# Patient Record
Sex: Female | Born: 1940 | Race: White | Hispanic: No | Smoking: Former smoker
Health system: Southern US, Community
[De-identification: ages and names within clinical notes are randomized; demographics above are authoritative.]

## PROBLEM LIST (undated history)

## (undated) DIAGNOSIS — I10 Essential (primary) hypertension: Secondary | ICD-10-CM

## (undated) DIAGNOSIS — J449 Chronic obstructive pulmonary disease, unspecified: Secondary | ICD-10-CM

## (undated) DIAGNOSIS — E785 Hyperlipidemia, unspecified: Secondary | ICD-10-CM

---

## 1998-05-21 ENCOUNTER — Ambulatory Visit (HOSPITAL_COMMUNITY): Admission: RE | Admit: 1998-05-21 | Discharge: 1998-05-21 | Payer: Self-pay | Admitting: Ophthalmology

## 1998-09-12 ENCOUNTER — Emergency Department (HOSPITAL_COMMUNITY): Admission: EM | Admit: 1998-09-12 | Discharge: 1998-09-12 | Payer: Self-pay | Admitting: Emergency Medicine

## 1998-09-12 ENCOUNTER — Encounter: Payer: Self-pay | Admitting: Emergency Medicine

## 2005-04-20 ENCOUNTER — Emergency Department (HOSPITAL_COMMUNITY): Admission: EM | Admit: 2005-04-20 | Discharge: 2005-04-20 | Payer: Self-pay | Admitting: Family Medicine

## 2007-04-11 ENCOUNTER — Emergency Department (HOSPITAL_COMMUNITY): Admission: EM | Admit: 2007-04-11 | Discharge: 2007-04-11 | Payer: Self-pay | Admitting: Emergency Medicine

## 2007-05-07 ENCOUNTER — Ambulatory Visit: Payer: Self-pay | Admitting: Cardiology

## 2007-05-14 ENCOUNTER — Ambulatory Visit: Payer: Self-pay

## 2007-05-15 ENCOUNTER — Ambulatory Visit (HOSPITAL_COMMUNITY): Admission: RE | Admit: 2007-05-15 | Discharge: 2007-05-15 | Payer: Self-pay | Admitting: Family Medicine

## 2007-05-15 ENCOUNTER — Ambulatory Visit: Payer: Self-pay | Admitting: Surgery

## 2007-06-25 ENCOUNTER — Inpatient Hospital Stay (HOSPITAL_COMMUNITY): Admission: RE | Admit: 2007-06-25 | Discharge: 2007-06-30 | Payer: Self-pay | Admitting: Orthopedic Surgery

## 2009-11-02 ENCOUNTER — Encounter: Admission: RE | Admit: 2009-11-02 | Discharge: 2009-11-02 | Payer: Self-pay | Admitting: Orthopedic Surgery

## 2010-06-15 ENCOUNTER — Emergency Department (HOSPITAL_COMMUNITY)
Admission: EM | Admit: 2010-06-15 | Discharge: 2010-06-15 | Payer: Self-pay | Source: Home / Self Care | Admitting: Emergency Medicine

## 2010-12-07 NOTE — H&P (Signed)
Melanie Williamson, Melanie Williamson                   ACCOUNT NO.:  [id number]   MEDICAL RECORD NO.:  [id number]          PATIENT TYPE:  INP   LOCATION:  1611                         FACILITY:  [facility]   PHYSICIAN:  Ollen Gross, M.D.    DATE OF BIRTH:  1940-08-10   DATE OF ADMISSION:  06/25/2007  DATE OF DISCHARGE:                              HISTORY & PHYSICAL   DATE OF OFFICE VISIT HISTORY AND PHYSICAL:  June 14, 2007   CHIEF COMPLAINT:  Left knee pain.   HISTORY OF PRESENT ILLNESS:  The patient is a 70 year old female, well  known to Dr. Homero Fellers Aluisio.  She has been treated by Dr. Fannie Knee long-term  for arthritis in her knee.  DR. Aluisio has been treating her recently.  She has followed up.  She states she has had injections in the past  including cortisone, Synvisc or Hyalgan which did not give her much  benefit.  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  She has known end-stage arthritis and is  felt to be a good candidate.  She has been seen by Rema Fendt, nurse  practitioner, and felt to be stable from a medical standpoint.  She has  also been seen by Dr. Valera Castle preoperatively from a cardiac  standpoint and felt that she is cleared for surgery.   ALLERGIES:  BIAXIN causes a rash.   CURRENT MEDICATIONS:  Xanax, lisinopril, verapamil, aspirin, metoprolol,  hydrochlorothiazide, multivitamin.   PAST MEDICAL HISTORY:  1. Hypertension.  2. Dyslipidemia.  3. Anxiety.  4. Atherosclerotic vascular disease.  5. History of TIA.   PAST SURGICAL HISTORY:  1. Tonsils and adenoids in 1958.  2. Hysterectomy in 1968.  3. Appendectomy in 1970.  4. Left knee surgery in 1976.   SOCIAL HISTORY:  Single.  She works as a Production designer, theatre/television/film in Warden/ranger at the  [employer], quit smoking in January 2008, occasional intake  of alcohol.  Three children.  Family members to assist  her with care  after surgery.   FAMILY HISTORY:  Hypertension and diabetes.   REVIEW OF SYSTEMS:  GENERAL:  No fevers, chills or night sweats.  NEUROLOGIC:  No seizures, syncope or paralysis.  RESPIRATORY:  No  shortness of breath, productive cough or hemoptysis.  CARDIOVASCULAR:  No chest pain, angina or orthopnea.  GI:  No nausea, vomiting, diarrhea  or constipation.  GU:  No dysuria, hematuria or discharge.  MUSCULOSKELETAL:  Left knee.   PHYSICAL EXAMINATION:  VITAL SIGNS:  Pulse 64, respiration 16, blood  pressure 122/64.  GENERAL:  A 70 year old white female, well-nourished, well-developed, of  short stature, in no acute distress.  She is alert, oriented and  cooperative, very pleasant, an excellent historian.  HEENT:  Normocephalic, atraumatic.  Pupils are round and reactive.  Oropharynx is clear.  EOMs intact.  She does have full upper and full  lower dentures noted.  NECK:  Supple.  CHEST: Clear, anterior  and posterior chest walls.  No rhonchi, rales or  wheezing.  HEART:  Regular rate and rhythm.  No murmur.  S1-S2 noted.  ABDOMEN:  Soft and nontender.  Bowel sounds are present.  RECTAL, BREASTS AND GENITALIA:  Not done and not pertinent to present  illness.  EXTREMITIES:  Left knee:  Small amount of valgus malalignment deformity.  Range of motion 5-115.  Marked crepitus is noted.  No instability.   IMPRESSION:  1. Osteoarthritis, left knee.  2. Hypertension.  3. Dyslipidemia.  4. Anxiety.  5. Atherosclerotic vascular disease.  6. History of transient ischemic attack.   PLAN:  The patient will be admitted to [facility] to undergo a  left total knee replacement arthroplasty.  Surgery will be performed by  Dr. Ollen Gross.      Alexzandrew L. Perkins, P.A.C.      Ollen Gross, M.D.  Electronically Signed    ALP/MEDQ  D:  06/27/2007  T:  06/28/2007  Job:  [id number]   cc:   [facility] Rema Fendt, NP

## 2010-12-07 NOTE — Op Note (Signed)
Melanie Williamson, Melanie Williamson                   ACCOUNT NO.:  [id number]   MEDICAL RECORD NO.:  [id number]          PATIENT TYPE:  INP   LOCATION:  0010                         FACILITY:  [facility]   PHYSICIAN:  Ollen Gross, M.D.    DATE OF BIRTH:  [birth year]   DATE OF PROCEDURE:  06/25/2007  DATE OF DISCHARGE:                               OPERATIVE REPORT   PREOPERATIVE DIAGNOSIS:  Osteoarthritis left knee.   POSTOPERATIVE DIAGNOSIS:  Osteoarthritis left knee.   PROCEDURE:  Left total knee arthroplasty.   SURGEON:  Ollen Gross, M.D.   ASSISTANT:  Avel Peace PA-C.   ANESTHESIA:  Spinal.   ESTIMATED BLOOD LOSS:  Minimal.   DRAINS:  None.   TOURNIQUET TIME:  36 minutes at 300 mmHg.   COMPLICATIONS:  None.   CONDITION:  Stable to recovery.   CLINICAL NOTE:  Melanie Williamson is a 70 year old female with severe end-stage  arthritis of the left knee with progressively worsening pain and  dysfunction.  She has failed nonoperative management and presents now  for total knee arthroplasty.   PROCEDURE IN DETAIL:  After the successful administration of spinal  anesthetic, a tourniquet is placed high on her left thigh and left lower  extremity prepped and draped in the usual sterile fashion.  Extremity is  wrapped in Esmarch, knee flexed and tourniquet inflated to 300 mmHg.  Midline incision was made with a 10 blade through subcutaneous tissue to  the level of the extensor mechanism.  A fresh blade is used to make a  medial parapatellar arthrotomy.  Soft tissue over the proximal medial  tibia subperiosteally elevated to the joint line with the knife and into  the semimembranosus bursa with a Cobb elevator.  Soft tissue laterally  is elevated with attention being paid to avoiding the patellar tendon on  tibial tubercle.  Patella is subluxed laterally, knee flexed 90 degrees  and ACL and PCL removed.  Drill was used to create a starting hole in  the distal femur and the canal is  thoroughly irrigated.   The 5 degree  left valgus alignment guide is placed and referencing off the posterior  condyles, rotation is marked and a block pinned to remove 10 mm off the  distal femur.  Distal femoral resection is made with an oscillating saw.  Sizing block is placed and size 2.5 is most appropriate.  Rotation is  marked at the epicondylar axis.  Size 2.5 cutting block is  placed and  the anterior, posterior and chamfer cuts are made.   Tibia is subluxed forward and the menisci are removed.  The  extramedullary tibial alignment guide is placed referencing proximally  at the medial aspect of the tibial tubercle and distally along the  second metatarsal axis and tibial crest.  The block is pinned to remove  2 mm off the more deficient lateral side.  Tibial resection is made with  an oscillating saw.  Size 2.5 is most appropriate tibial component and  the proximal tibia is prepared with the modular drill and keel punch for  size 2.5.  The  femoral preparation is completed the intercondylar cut.   Size 2.5 mobile bearing tibial trial, 2.5 posterior stabilized femoral  trial and a 12.5 mm posterior stabilized rotating platform insert trial  were placed.  With the 12.5, full extension is achieved with excellent  varus and valgus balance throughout full range of motion.  Patella is  everted, thickness measured to be 22 mm.  Freehand resection is taken to  13 mm, 35 template is placed, lug holes are drilled, trial patella s  placed and it tracks normally.  Osteophytes are then removed off the  posterior femur with the trial in place.  All trials are removed and the  cut bone surfaces are prepared with pulsatile lavage.  Cement is mixed  and once ready for implantation, the size 2.5 mobile bearing tibia, 2.5  posterior stabilized femur, 35 patella are cemented into place.  Patella  is held with a clamp.  Trial 12.5 mm insert is placed, knee held in full  extension, all extruded cement removed.  When the  cement is fully  hardened and the trials removed, the FloSeal injected on the posterior  capsule.  The permanent 12.5 mm posterior stabilized rotating platform  insert is placed in the tibial tray.  The FloSeal is then injected into  the medial and lateral gutters in suprapatellar area.  The tourniquet is  released then for total time of 36 minutes.  Moist sponge is placed and  held for two minutes.  Sponge removed and minimal bleeding is  encountered.  That which is encountered  is stopped with electrocautery.  We irrigated again and then closed the arthrotomy with interrupted #1  PDS.  Flexion against gravity is 140 degrees.  The subcu is closed with  interrupted 2-0 Vicryl, subcuticular running 4-0 Monocryl.  The incision  is cleaned and dried and Steri-Strips and bulky sterile dressing  applied.  She is placed into a knee immobilizer, awakened and  transported to recovery in stable condition.      Ollen Gross, M.D.  Electronically Signed     FA/MEDQ  D:  06/25/2007  T:  06/25/2007  Job:  [id number]

## 2010-12-07 NOTE — Assessment & Plan Note (Signed)
[facility]                            CARDIOLOGY OFFICE NOTE   NAME:Schiavo, Melanie Williamson                          MRN:          [id number]  DATE:05/07/2007                            DOB:          1941-03-03    I was asked by Dr. Ollen Gross to clear Andy Gauss for left knee total  knee replacement.   HISTORY OF PRESENT ILLNESS:  She is a delightful 70 year old divorced  white female who I actually know personally through [employer].  She is having a lot of pain in her left knee and has been advised  by Dr. Lequita Halt to have total knee replacement which is scheduled to  occur the first week of December.   She has no previous cardiac history.  She had an EKG done preoperatively  which showed some changes from her previous ECG.   She is having no symptoms of shortness of breath, tachy palpitations,  presyncope, syncope, chest pain.  She does have a history of  hypertension for a number of years which has been under good control she  says.  She is followed at [facility].   Her preoperative chest x-ray shows some atherosclerotic calcification of  the thoracic aorta and great vessels and some tortuosity but normal  heart size.   PAST MEDICAL HISTORY:  1. ALLERGIC TO AN ANTIBIOTIC BUT SHE CAN NOT REMEMBER THE NAME OF IT,      IT CAUSED A RASH.  2. She quit smoking 3 months ago but was not a heavy smoker.  3. She drinks alcohol socially.  4. She does not have elevated cholesterol.   PREVIOUS SURGERIES:  1. Arthroscopic knee surgery.  2. Appendectomy in the past.   FAMILY HISTORY:  She had a brother who had premature coronary disease  but he was diabetic.   Her occupation:  She is a Merchandiser, retail at the [employer], she  has been there for a number of years.   REVIEW OF SYSTEMS:  Other than the HPI is negative.   EXAMINATION:  Her blood pressure is 166/86 today, her pulse is 83 and  regular, she is 5 feet tall  and weighs 145 pounds.  HEENT:  Normocephalic/atraumatic, PERRLA, extraocular movements intact,  sclerae are clear, facial symmetry is normal.  NECK:  Supple, carotid upstrokes were equal bilaterally without bruits,  there is no JVD, thyroid is not enlarged, trachea is midline.  LUNGS:  Reveal decreased breath sounds throughout, no rhonchi or  wheezes.  HEART:  Reveals a nondisplaced PMI.  She has normal S1 and S2 without  murmur, rub or gallop.  ABDOMINAL:  Soft with good bowel sounds, there is no midline bruit,  there is no hepatomegaly.  EXTREMITIES:  Reveal no cyanosis, clubbing or edema.  She has a mottled  skin pattern.  Pulses were brisk both dorsalis pedis and posterior  tibial.  There is no sign of DVT.  NEURO:  Intact.   Her EKG today shows poor R wave progression across the anterior  precordium and some ST segment flattening in leads  2 and 3 and biphasic  T waves in 1 and aVL.   I had a nice chat with Ayrianna.  We will plan a 2D echocardiogram to assure  ourselves that she has not had a previous anterior septal infarct.  I  suspect this is probably related to clockwise rotation of her heart from  COPD.   Some of the EKG changes could be due to hypertension.  I have advised  her to follow up with Family Practice at [facility] to assure good  blood pressure control.  She may need some adjustment in her medication  which has not been changed in a while.   I also reinforced the fact that she should not smoke.  I will see her  back p.r.n. otherwise and will clear her for surgery if her echo is  normal.     Maisie Fus C. Daleen Squibb, MD, [facility]  Electronically Signed    TCW/MedQ  DD: 05/07/2007  DT: 05/08/2007  Job #: [id number]   cc:   Ollen Gross, M.D.  [facility]

## 2010-12-10 NOTE — Discharge Summary (Signed)
Melanie Williamson, Melanie Williamson                   ACCOUNT NO.:  [id number]   MEDICAL RECORD NO.:  [id number]          PATIENT TYPE:  INP   LOCATION:  1611                         FACILITY:  [facility]   PHYSICIAN:  Melanie Williamson, M.D.    DATE OF BIRTH:  22-Jan-1941   DATE OF ADMISSION:  06/25/2007  DATE OF DISCHARGE:  06/30/2007                               DISCHARGE SUMMARY   ADMITTING DIAGNOSIS:  1. Osteoarthritis left knee.  2. Hypertension.  3. Dyslipidemia.  4. Anxiety.  5. Atherosclerotic vascular disease.  6. History of transient ischemic attack.   DISCHARGE DIAGNOSIS:  1. Osteoarthritis left knee status post left total knee arthroplasty.  2. Acute blood loss anemia.  3. Status post transfusion without sequelae.  4. Postop hyponatremia improved.  5. Hypertension.  6. Dyslipidemia.  7. Anxiety.  8. Atherosclerotic vascular disease.  9. History of transient ischemic attack.   PROCEDURE:  June 25, 2007 left total knee, surgeon Dr. Lequita Halt,  assistant Melanie Peace, PA-C, anesthesia was spinal.   CONSULTS:  None.   BRIEF HISTORY:  Melanie Williamson is a 70 year old female with severe end-stage  arthritis of the left knee with progressive worsening pain and  dysfunction who failed nonoperative management and now presents for a  total knee   LABORATORY DATA:  Preop CBC showed a hemoglobin of 12.9, hematocrit  37.1, dropped postop down to 10.5 and then 8.7, drifted further down to  7.5, given 2 units of blood. Post transfusion hemoglobin 11.1 and 32.1.  Chem panel on admission all within normal limits with the exception of  minimally elevated AST of 40 and minimally elevated ALT of 58. Serial  BMETs are followed, sodium did drop down from 135 to 131 back up to 136.  The remaining electrolytes remained within normal limits. Preop UA  negative.  Blood group type A+. Chest x-ray two view June 28, 2007  probable COPD, no active disease. There is an EKG on the chart dated  May 07, 2007 normal  sinus rhythm, abnormal EKG, anterior septal  infarct, age undetermined, confirmed by Dr. Daleen Williamson.   HOSPITAL COURSE:  The patient was admitted to [facility],  tolerated the procedure well and later transferred to the recovery room  on the orthopedic floor, started on PCA and p.o. analgesics. Given 24  hours postop IV antibiotics, doing pretty well on the morning of day  one. IV came out so it was restarted. Removed her knee immobilizer. Had  a little bit of weakness with the dorsiflexion of the  left foot so I  held the CPM, discontinued the knee immobilizer only to use for  ambulation. I started getting up out of bed. By day two, she was doing a  little bit better, the weakness in her foot had improved, it just needed  some swelling compression. The dressing was changed, incision looked  good.  Hemoglobin was drifting down noted to be 8.9 on day two. Started  iron, a little tired but did get up by day two and walk about 90 feet.  Day three still a little bit better.  Hemoglobin was stable, was running  a little bit of O2 saturations being low and some mild confusion felt to  be narcotic, but we checked the chest x-ray and no active disease was  seen on that and monitored. Her O2 sats were slowly improving. Felt to  be possible component of blood,  being anemic. She was a little low the  next day at 7.5, definitely recommended blood even though her confusion  had resolved. Felt morning the confusion was more narcotic and possibly  O2 sats.  She did receive 2 units of blood and her blood counts came  back up.  She did well after the blood feeling better and was ready to  go home over the weekend on June 30, 2007.   DISCHARGE/PLAN:  1. The patient was discharged home on June 30, 2007.  2. Discharge diagnoses please see above.  3. Discharge medications:  Darvocet, Robaxin, Nu-Iron and Coumadin.   ACTIVITY:  Weightbearing as tolerated, total knee protocol, home health  PT,  home health nursing.   DISPOSITION:  Home.   DIET:  Low-sodium heart-healthy diet.   FOLLOW-UP:  On December 16 or December 18, call the office for an  appointment.   CONDITION ON DISCHARGE:  Improved.      Melanie Williamson, P.A.C.      Melanie Williamson, M.D.  Electronically Signed    ALP/MEDQ  D:  08/28/2007  T:  08/28/2007  Job:  [id number]   cc:   Melanie Moh, NP  [facility]

## 2011-02-21 ENCOUNTER — Emergency Department (HOSPITAL_COMMUNITY)
Admission: EM | Admit: 2011-02-21 | Discharge: 2011-02-22 | Disposition: A | Discharge status: Home / Self Care | Payer: Medicare Other | Source: Home / Self Care | Attending: Emergency Medicine | Admitting: Emergency Medicine

## 2011-02-21 DIAGNOSIS — L989 Disorder of the skin and subcutaneous tissue, unspecified: Secondary | ICD-10-CM | POA: Insufficient documentation

## 2011-02-21 DIAGNOSIS — IMO0002 Reserved for concepts with insufficient information to code with codable children: Secondary | ICD-10-CM | POA: Insufficient documentation

## 2011-02-22 ENCOUNTER — Inpatient Hospital Stay (HOSPITAL_COMMUNITY)
Admission: EM | Admit: 2011-02-22 | Discharge: 2011-02-24 | DRG: 603 | Disposition: A | Discharge status: Home / Self Care | Payer: Medicare Other | Attending: Internal Medicine | Admitting: Internal Medicine

## 2011-02-22 DIAGNOSIS — E039 Hypothyroidism, unspecified: Secondary | ICD-10-CM | POA: Diagnosis present

## 2011-02-22 DIAGNOSIS — I10 Essential (primary) hypertension: Secondary | ICD-10-CM | POA: Diagnosis present

## 2011-02-22 DIAGNOSIS — E871 Hypo-osmolality and hyponatremia: Secondary | ICD-10-CM | POA: Diagnosis present

## 2011-02-22 DIAGNOSIS — F17200 Nicotine dependence, unspecified, uncomplicated: Secondary | ICD-10-CM | POA: Diagnosis present

## 2011-02-22 DIAGNOSIS — IMO0002 Reserved for concepts with insufficient information to code with codable children: Principal | ICD-10-CM | POA: Diagnosis present

## 2011-02-22 LAB — CBC
MCH: 31.4 pg (ref 26.0–34.0)
MCHC: 34.1 g/dL (ref 30.0–36.0)
MCV: 92.1 fL (ref 78.0–100.0)
Platelets: 215 10*3/uL (ref 150–400)
RDW: 12.2 % (ref 11.5–15.5)

## 2011-02-22 LAB — DIFFERENTIAL
Basophils Relative: 0 % (ref 0–1)
Eosinophils Absolute: 0.2 10*3/uL (ref 0.0–0.7)
Eosinophils Relative: 2 % (ref 0–5)
Lymphs Abs: 1.2 10*3/uL (ref 0.7–4.0)
Monocytes Relative: 8 % (ref 3–12)

## 2011-02-22 LAB — BASIC METABOLIC PANEL
Calcium: 9.9 mg/dL (ref 8.4–10.5)
Creatinine, Ser: 0.94 mg/dL (ref 0.50–1.10)
GFR calc Af Amer: >60 mL/min (ref >60)
GFR calc non Af Amer: 59 mL/min — ABNORMAL LOW (ref >60)

## 2011-02-23 LAB — DIFFERENTIAL
Basophils Absolute: 0 10*3/uL (ref 0.0–0.1)
Basophils Relative: 1 % (ref 0–1)
Eosinophils Absolute: 0.2 10*3/uL (ref 0.0–0.7)
Eosinophils Relative: 3 % (ref 0–5)
Lymphocytes Relative: 23 % (ref 12–46)
Monocytes Absolute: 0.6 10*3/uL (ref 0.1–1.0)

## 2011-02-23 LAB — CBC
HCT: 33.8 % — ABNORMAL LOW (ref 36.0–46.0)
MCHC: 34.3 g/dL (ref 30.0–36.0)
RDW: 12.3 % (ref 11.5–15.5)

## 2011-02-23 LAB — BASIC METABOLIC PANEL
BUN: 17 mg/dL (ref 6–23)
Creatinine, Ser: 1.09 mg/dL (ref 0.50–1.10)
GFR calc Af Amer: >60 mL/min (ref >60)
GFR calc non Af Amer: 50 mL/min — ABNORMAL LOW (ref >60)
Potassium: 4.1 mEq/L (ref 3.5–5.1)

## 2011-02-23 LAB — HEMOGLOBIN A1C: Hgb A1c MFr Bld: 5.7 % — ABNORMAL HIGH (ref <5.7)

## 2011-02-26 NOTE — Discharge Summary (Signed)
Melanie Williamson, Melanie Williamson                   ACCOUNT NO.:  [id number]  MEDICAL RECORD NO.:  [id number]  LOCATION:  1344                         FACILITY:  [facility]  PHYSICIAN:  Marinda Elk, M.D.DATE OF BIRTH:  [birth year]  DATE OF ADMISSION:  02/22/2011 DATE OF DISCHARGE:  02/24/2011                              DISCHARGE SUMMARY   PRIMARY CARE DOCTOR:  [facility].  DISCHARGE DIAGNOSES: 1. Failed outpatient treatment of cellulitis, now improved. 2. Hyponatremia. 3. Hypertension.  DISCHARGE MEDICATIONS: 1. Doxycycline 100 mg p.o. b.i.d. 2. __________ apply topically t.i.d. 3. Lisinopril 40 mg daily. 4. Metoprolol 25 mg b.i.d. 5. Nicotine patch transdermally daily. 6. Pravachol 40 mg every morning. 7. Verapamil SR 240 mg every morning. 8. Vitamin D over-the-counter daily. 9. Vitamin E over the counter daily.  IMAGING:  None.  BRIEF ADMITTING HISTORY AND PHYSICAL:  This is a 69 year old female with past medical history of cellulitis, recently treated by her primary care doctor with Bactrim topical n.p.o.  She sustained laceration 5 days prior to admission and has got infected and as mentioned previously, she saw primary care doctor, who has been treating her for cellulitis.  Her cellulitis continued to get worse and advancing in the inside of her arm.  So, she decided to come to the ED.  She was admitted by [facility] and for further evaluation and treatment, please refer to dictation from February 22, 2011, for further details.  LABORATORY DATA ON ADMISSION:  A white count of 10.2, hemoglobin of 12, platelet count 215, ANC of 7.9.  Sodium was 128, potassium 4.8, chloride 98, bicarb of 25, glucose 104, BUN of 16, creatinine 0.9, calcium 9.9, her hemoglobin A1c was 5.7.  ASSESSMENT PLAN: 1. Failed outpatient treatment cellulitis.  She was admitted to the     floor, was on IV vancomycin and Zosyn.  By the next day, it was     improved.  Her vancomycin and Zosyn were  stopped.  She was started     on doxy.  By the next day, on the day of discharge, her redness has     significantly decreased and she started to drain.  There is no     collection or abscess.  She will be started on her doxy  for     several more days. 2. Hyponatremia, that is probably secondary to dehydration.  This     resolved with IV fluids. 3. Hypertension, currently high 160/76.  She was restarted on home     meds.  This will be followed up by her primary care doctor and     titrate medications as needed.  VITALS ON DAY OF DISCHARGE:  VITAL SIGNS:  Temperature of 98, pulse of 80, respiration of 16, blood pressure 162/76.  She was satting 94% on room air.  LABORATORY DATA:  On day of discharge shows a sodium of 132, potassium 4.1, chloride 98, bicarb 24, glucose of 106, BUN of 17, creatinine 1.0, calcium 9.5.  Her white count of 6.5, hemoglobin of 11.6, platelet count of 190, and ANC of 4.2.     Marinda Elk, M.D.     AF/MEDQ  D:  02/24/2011  T:  02/25/2011  Job:  [id number]  cc:   [facility]  Electronically Signed by Marinda Elk M.D. on 02/26/2011 08:07:06 PM

## 2011-03-08 NOTE — H&P (Signed)
NAMEDESSIRAE, SCAROLA                   ACCOUNT NO.:  [id number]  MEDICAL RECORD NO.:  [id number]  LOCATION:  [facility]                         FACILITY:  [facility]  PHYSICIAN:  Altha Harm, MDDATE OF BIRTH:  May 29, 1941  DATE OF ADMISSION:  02/22/2011 DATE OF DISCHARGE:                             HISTORY & PHYSICAL   CHIEF COMPLAINT:  Failed outpatient treatment of cellulitis of the right upper extremity.  HISTORY OF PRESENT ILLNESS:  Ms. Scogin is a very lovely 70 year old female who states that approximately 5 days ago she was in the grocery store picking up a bag of potatoes from out of the cart when she sustained a laceration to her right forearm.  The patient states that she was seen in the Urgent Care 2 days' later and received Septra which she has been taking.  She states that since taking the Septra, the cellulitis has actually advanced up her arm and outside of the lines that there were originally marked.  She was seen in the emergency room last night and sent home, however, even today the cellulitis has extended even further.  The patient denies any fever, any chills.  She denies any nausea, vomiting or diarrhea, and states that actually see she feels well.  She states her only concern is the arm.  The patient states that she went to the pharmacy and got some what she described as Tegaderm and placed it on the forearm and she thinks that it was at that time that she started seeing an advancement of the cellulitis.  PAST MEDICAL HISTORY:  Significant for hypertension and hypothyroidism.  FAMILY HISTORY:  Coronary artery disease in father and brother. Diabetes in her sister and her brother, and lung cancer in her brother.  SOCIAL HISTORY:  The patient lives with her granddaughter Gershon Cull and her grandson.  Her son, Verdia Kuba, is her healthcare power of attorney.  She smokes approximately 1 cigarette per day.  She occasionally drinks a beer and she denies any drug use.   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  ALLERGIES:  CLARITHROMYCIN.  CURRENT MEDICATIONS: 1. Lisinopril 40 mg p.o. daily. 2. Bactroban applied topically t.i.d. 3. Nicotine patch 40 mcg per day transdermally daily. 4. Metoprolol 25 mg p.o. b.i.d. 5. Verapamil SR 240 mg p.o. daily. 6. Pravachol 40 mg p.o. daily. 7. Septra DS 1 tablet p.o. b.i.d. 8. Vitamin D3 over-the-counter 1 tablet p.o. daily. 9. Vitamin E over-the-counter 1 tablet p.o. daily.  PRIMARY CARE PHYSICIANS:  [facility].  REVIEW OF SYSTEMS:  All other systems negative.  STUDIES IN THE EMERGENCY ROOM:  Hemogram shows a white blood cell count of 10.2, hematocrit of 37.5, hemoglobin of 12.8, platelet count of 315. Sodium 128, potassium 4.8, chloride 94, bicarb 25, BUN 16, creatinine 0.94.  PHYSICAL EXAMINATION:  GENERAL:  The patient is a very lively 70 year old lady who looks younger than her stated age. VITAL SIGNS:  Temperature is 97.3, heart rate 81, respiratory rate 20, blood pressure 164/85, O2 saturations are 90% on room air.  HEENT: Normocephalic.  The patient has a scratch on her nasal flare;  she states what she sustained was a scratch by her grandson.  Pupils are equally round and reactive to light and accommodation.  Extraocular movements are intact.  Conjunctiva show no injection or icterus.  No conjunctival pallor.  Fundi are benign.  Oropharynx is moist.  No exudate, erythema or lesions are noted. NECK:  Trachea is midline.  No masses, no thyromegaly, no JVD, no carotid bruit. RESPIRATORY:  Normal respiratory effort, equal excursions bilaterally. No wheezing or rhonchi noted.  No increased vocal fremitus noted. CARDIOVASCULAR:  Normal S1 and S2.  No murmurs, rubs or gallops noted. PMI is nondisplaced.  No heaves or thrills on palpation. ABDOMEN:  Obese, soft, nontender, nondistended.  No masses, no hepatosplenomegaly noted. LYMPH NODE  SURVEY:  Some axillary lymphadenopathy noted in the right axilla.  No cervical, left axillary or inguinal lymphadenopathy noted. NEUROLOGIC:  No focal neurological deficits.  Cranial nerves II-XII are grossly intact.  DTRs are 2+ bilaterally in the upper and lower extremities. PSYCHIATRIC:  Alert and oriented x3, good insight and cognition, good recent and remote recall. EXTREMITIES and SKIN:  The patient has an area of the right forearm which is erythematous.  There is warmth and edema.  There is no induration noted.  There is a laceration which is partially healed on the inner surface of the forearm about midway up the forearm.  There is no induration and no fluid expressed from the area.  ASSESSMENT AND PLAN: 1. This is a patient who presents with cellulitis with failed     outpatient treatment.  The patient is not a diabetic but does have     diabetes that runs in her family and this could be a herald sign of     presentation.  Thus we will go ahead and check a hemoglobin A1c on     the patient and treat her as a diabetic given the failure of an     appropriate antibiotic to treat this.  We will treat with     vancomycin and Zosyn and reassess on tomorrow to see whether or not     there is any improvement in the cellulitis. 2. The patient has chronic hyponatremia and this is represented with a     sodium of 128 which the patient says is usual for her.  I will go     ahead and recheck it tomorrow to ensure that she does not have any     further decrease in her sodium. 3. Hypertension:  The patient's blood pressure is not optimally     controlled but is acceptable.  The patient will resume her usual     medications.  In terms of her diet, due to the patient's     hyponatremia, I will not put her on salt-restricted diet but rather     on a regular diet. 4. Deep venous thrombosis prophylaxis with Lovenox. 5. We will reassess the patient.  Further therapeutic approach will     depend  upon the patient's initial response to therapy and testing.     Altha Harm, MD     MAM/MEDQ  D:  02/22/2011  T:  02/22/2011  Job:  [id number]  cc:   [facility] [address] [place], [place]  Electronically Signed by Marthann Schiller MD on 03/08/2011 08:08:51 PM

## 2011-05-02 LAB — BASIC METABOLIC PANEL
BUN: 17
Calcium: 8.2 — ABNORMAL LOW
Chloride: 98
Creatinine, Ser: 1.04
Creatinine, Ser: 1.71 — ABNORMAL HIGH
GFR calc Af Amer: 49 — ABNORMAL LOW
GFR calc Af Amer: >60
GFR calc Af Amer: >60
GFR calc non Af Amer: 30 — ABNORMAL LOW
GFR calc non Af Amer: 41 — ABNORMAL LOW
GFR calc non Af Amer: 53 — ABNORMAL LOW
GFR calc non Af Amer: >60
Glucose, Bld: 99
Potassium: 3.8
Potassium: 4
Potassium: 5.1
Sodium: 133 — ABNORMAL LOW
Sodium: 136

## 2011-05-02 LAB — PROTIME-INR
INR: 1
INR: 1.4
INR: 2.1 — ABNORMAL HIGH
INR: 3.3 — ABNORMAL HIGH
Prothrombin Time: 13.2
Prothrombin Time: 23.8 — ABNORMAL HIGH
Prothrombin Time: 25.7 — ABNORMAL HIGH

## 2011-05-02 LAB — CBC
HCT: 21.1 — ABNORMAL LOW
HCT: 25.1 — ABNORMAL LOW
HCT: 32.1 — ABNORMAL LOW
Hemoglobin: 7.5 — CL
Hemoglobin: 8.9 — ABNORMAL LOW
MCHC: 34.6
MCV: 94.5
MCV: 96.4
Platelets: 270
Platelets: 286
RBC: 2.2 — ABNORMAL LOW
RBC: 2.6 — ABNORMAL LOW
RBC: 2.63 — ABNORMAL LOW
RDW: 12.1
WBC: 11.2 — ABNORMAL HIGH
WBC: 8.4

## 2011-05-02 LAB — CROSSMATCH
ABO/RH(D): A POS
Antibody Screen: NEGATIVE

## 2011-05-02 LAB — DIFFERENTIAL
Basophils Relative: 0
Eosinophils Absolute: 0.1 — ABNORMAL LOW
Eosinophils Relative: 1
Neutrophils Relative %: 75

## 2011-05-03 LAB — CBC
HCT: 37.1
Hemoglobin: 12.9
MCHC: 34.9
Platelets: 312
RDW: 11.9

## 2011-05-03 LAB — PROTIME-INR
INR: 0.9
Prothrombin Time: 12.4

## 2011-05-03 LAB — ABO/RH: ABO/RH(D): A POS

## 2011-05-03 LAB — URINALYSIS, ROUTINE W REFLEX MICROSCOPIC
Ketones, ur: NEGATIVE
Protein, ur: NEGATIVE
Urobilinogen, UA: 0.2

## 2011-05-03 LAB — COMPREHENSIVE METABOLIC PANEL
Albumin: 3.8
Alkaline Phosphatase: 108
BUN: 10
Calcium: 9.6
Glucose, Bld: 112 — ABNORMAL HIGH
Potassium: 4.2
Sodium: 135
Total Protein: 7

## 2011-05-03 LAB — TYPE AND SCREEN: ABO/RH(D): A POS

## 2011-07-17 ENCOUNTER — Other Ambulatory Visit: Payer: Self-pay

## 2011-07-17 ENCOUNTER — Inpatient Hospital Stay (HOSPITAL_COMMUNITY)
Admission: EM | Admit: 2011-07-17 | Discharge: 2011-07-19 | DRG: 191 | Disposition: A | Discharge status: Home / Self Care | Payer: Medicare Other | Attending: Internal Medicine | Admitting: Internal Medicine

## 2011-07-17 ENCOUNTER — Emergency Department (HOSPITAL_COMMUNITY): Payer: Medicare Other

## 2011-07-17 DIAGNOSIS — J40 Bronchitis, not specified as acute or chronic: Secondary | ICD-10-CM | POA: Diagnosis present

## 2011-07-17 DIAGNOSIS — F17200 Nicotine dependence, unspecified, uncomplicated: Secondary | ICD-10-CM | POA: Diagnosis present

## 2011-07-17 DIAGNOSIS — I10 Essential (primary) hypertension: Secondary | ICD-10-CM

## 2011-07-17 DIAGNOSIS — E871 Hypo-osmolality and hyponatremia: Secondary | ICD-10-CM

## 2011-07-17 DIAGNOSIS — R0902 Hypoxemia: Secondary | ICD-10-CM | POA: Diagnosis present

## 2011-07-17 DIAGNOSIS — E860 Dehydration: Secondary | ICD-10-CM

## 2011-07-17 DIAGNOSIS — E785 Hyperlipidemia, unspecified: Secondary | ICD-10-CM

## 2011-07-17 DIAGNOSIS — J479 Bronchiectasis, uncomplicated: Secondary | ICD-10-CM | POA: Diagnosis present

## 2011-07-17 DIAGNOSIS — M4000 Postural kyphosis, site unspecified: Secondary | ICD-10-CM | POA: Diagnosis present

## 2011-07-17 DIAGNOSIS — M8448XA Pathological fracture, other site, initial encounter for fracture: Secondary | ICD-10-CM | POA: Diagnosis present

## 2011-07-17 DIAGNOSIS — J441 Chronic obstructive pulmonary disease with (acute) exacerbation: Principal | ICD-10-CM | POA: Diagnosis present

## 2011-07-17 HISTORY — DX: Hyperlipidemia, unspecified: E78.5

## 2011-07-17 HISTORY — DX: Essential (primary) hypertension: I10

## 2011-07-17 LAB — CBC
HCT: 37.5 % (ref 36.0–46.0)
Hemoglobin: 12.9 g/dL (ref 12.0–15.0)
MCHC: 34.4 g/dL (ref 30.0–36.0)
MCV: 92.1 fL (ref 78.0–100.0)
RDW: 11.9 % (ref 11.5–15.5)
WBC: 5.8 10*3/uL (ref 4.0–10.5)

## 2011-07-17 LAB — BASIC METABOLIC PANEL
BUN: 31 mg/dL — ABNORMAL HIGH (ref 6–23)
CO2: 22 mEq/L (ref 19–32)
Calcium: 9 mg/dL (ref 8.4–10.5)
Chloride: 91 mEq/L — ABNORMAL LOW (ref 96–112)
Creatinine, Ser: 1.28 mg/dL — ABNORMAL HIGH (ref 0.50–1.10)

## 2011-07-17 LAB — DIFFERENTIAL
Basophils Absolute: 0 10*3/uL (ref 0.0–0.1)
Eosinophils Relative: 0 % (ref 0–5)
Lymphocytes Relative: 21 % (ref 12–46)
Monocytes Absolute: 0.7 10*3/uL (ref 0.1–1.0)
Monocytes Relative: 12 % (ref 3–12)
Neutro Abs: 3.8 10*3/uL (ref 1.7–7.7)

## 2011-07-17 LAB — OSMOLALITY: Osmolality: 269 mOsm/kg — ABNORMAL LOW (ref 275–300)

## 2011-07-17 MED ORDER — ENOXAPARIN SODIUM 40 MG/0.4ML ~~LOC~~ SOLN
40.0000 mg | SUBCUTANEOUS | Status: DC
  Administered 2011-07-17 – 2011-07-18 (×2): 40 mg via SUBCUTANEOUS
  Filled 2011-07-17 (×4): qty 0.4

## 2011-07-17 MED ORDER — METOPROLOL SUCCINATE ER 25 MG PO TB24
25.0000 mg | ORAL_TABLET | Freq: Two times a day (BID) | ORAL | Status: DC
Start: 2011-07-17 — End: 2011-07-19
  Administered 2011-07-17 – 2011-07-19 (×4): 25 mg via ORAL
  Filled 2011-07-17 (×7): qty 1

## 2011-07-17 MED ORDER — DEXTROSE 5 % IV SOLN
1.0000 g | Freq: Once | INTRAVENOUS | Status: AC
  Administered 2011-07-17: 1 g via INTRAVENOUS
  Filled 2011-07-17: qty 10

## 2011-07-17 MED ORDER — VERAPAMIL HCL 240 MG (CO) PO TB24
240.0000 mg | ORAL_TABLET | Freq: Every day | ORAL | Status: DC
  Administered 2011-07-17 – 2011-07-18 (×2): 240 mg via ORAL
  Filled 2011-07-17 (×4): qty 1

## 2011-07-17 MED ORDER — ONDANSETRON HCL 4 MG/2ML IJ SOLN: 4.0000 mg | Freq: Four times a day (QID) | INTRAMUSCULAR | Status: DC | PRN

## 2011-07-17 MED ORDER — LISINOPRIL 40 MG PO TABS
40.0000 mg | ORAL_TABLET | Freq: Every day | ORAL | Status: DC
Start: 2011-07-17 — End: 2011-07-19
  Administered 2011-07-18 – 2011-07-19 (×2): 40 mg via ORAL
  Filled 2011-07-17 (×4): qty 1

## 2011-07-17 MED ORDER — IPRATROPIUM BROMIDE 0.02 % IN SOLN
0.5000 mg | Freq: Four times a day (QID) | RESPIRATORY_TRACT | Status: DC
  Administered 2011-07-17 – 2011-07-19 (×7): 0.5 mg via RESPIRATORY_TRACT
  Filled 2011-07-17 (×7): qty 2.5

## 2011-07-17 MED ORDER — MOXIFLOXACIN HCL 400 MG PO TABS
400.0000 mg | ORAL_TABLET | Freq: Every day | ORAL | Status: DC
  Administered 2011-07-17: 400 mg via ORAL
  Filled 2011-07-17 (×2): qty 1

## 2011-07-17 MED ORDER — ONDANSETRON HCL 4 MG PO TABS: 4.0000 mg | ORAL_TABLET | Freq: Four times a day (QID) | ORAL | Status: DC | PRN

## 2011-07-17 MED ORDER — ALBUTEROL SULFATE (5 MG/ML) 0.5% IN NEBU: 2.5000 mg | INHALATION_SOLUTION | RESPIRATORY_TRACT | Status: DC | PRN

## 2011-07-17 MED ORDER — SODIUM CHLORIDE 0.9 % IV SOLN
INTRAVENOUS | Status: DC
  Administered 2011-07-17 – 2011-07-18 (×2): via INTRAVENOUS
  Administered 2011-07-18: 20 mL/h via INTRAVENOUS
  Administered 2011-07-18: 03:00:00 via INTRAVENOUS

## 2011-07-17 MED ORDER — SODIUM CHLORIDE 0.9 % IV SOLN
INTRAVENOUS | Status: AC
  Administered 2011-07-17: 75 mL via INTRAVENOUS

## 2011-07-17 MED ORDER — SIMVASTATIN 20 MG PO TABS
20.0000 mg | ORAL_TABLET | Freq: Every day | ORAL | Status: DC
  Filled 2011-07-17 (×2): qty 1

## 2011-07-17 MED ORDER — GUAIFENESIN ER 600 MG PO TB12
600.0000 mg | ORAL_TABLET | Freq: Two times a day (BID) | ORAL | Status: DC
  Administered 2011-07-17 – 2011-07-19 (×4): 600 mg via ORAL
  Filled 2011-07-17 (×7): qty 1

## 2011-07-17 MED ORDER — ACETAMINOPHEN 325 MG PO TABS: 650.0000 mg | ORAL_TABLET | Freq: Four times a day (QID) | ORAL | Status: DC | PRN

## 2011-07-17 MED ORDER — MOXIFLOXACIN HCL IN NACL 400 MG/250ML IV SOLN
400.0000 mg | Freq: Once | INTRAVENOUS | Status: DC
  Filled 2011-07-17: qty 250

## 2011-07-17 MED ORDER — TRAMADOL HCL 50 MG PO TABS
50.0000 mg | ORAL_TABLET | Freq: Four times a day (QID) | ORAL | Status: DC | PRN
  Administered 2011-07-18: 50 mg via ORAL
  Filled 2011-07-17: qty 1

## 2011-07-17 MED ORDER — ACETAMINOPHEN 650 MG RE SUPP: 650.0000 mg | Freq: Four times a day (QID) | RECTAL | Status: DC | PRN

## 2011-07-17 MED ORDER — ALBUTEROL SULFATE (5 MG/ML) 0.5% IN NEBU
2.5000 mg | INHALATION_SOLUTION | Freq: Four times a day (QID) | RESPIRATORY_TRACT | Status: DC
  Administered 2011-07-17 – 2011-07-19 (×7): 2.5 mg via RESPIRATORY_TRACT
  Filled 2011-07-17 (×7): qty 0.5

## 2011-07-17 NOTE — ED Notes (Signed)
Pt was seen at Urgent Care on Thursday for the same, was told she had a head cold, today she's worse having trouble breathing and having diarrhea

## 2011-07-17 NOTE — ED Notes (Signed)
Admitting MD in room.

## 2011-07-17 NOTE — ED Notes (Signed)
Pt c/o sob and cold symptoms, onset Wednesday. No fever. Productive cough with yellow sputum. Wheezing noted bilaterally. Pt appeared weak. Report no appetite. Sat 95% on 2 L [place]. Denied using oxygen at home. Alert, oriented. Calm, cooperative. No respiratory distress.

## 2011-07-17 NOTE — ED Provider Notes (Signed)
History     CSN: [id number]  Arrival date & time 07/17/11  1240   First MD Initiated Contact with Patient 07/17/11 1322      Chief Complaint  Patient presents with  . Shortness of Breath    (Consider location/radiation/quality/duration/timing/severity/associated sxs/prior treatment) HPI Comments: Pt has had URI symptoms for about one week with nasal congestion, cough.  Was seen at Urgent care 3 days ago, dx with URI, given cough syrup which she doesn't take because it makes her feel weird.  Over last two days, has had worsening chest congestion on SOB.  Coughing up brown sputum.  No known fevers.  No CP.  No abd pain.  No vomiting, but is having some loose stools.  Patient is a 70 y.o. female presenting with shortness of breath. The history is provided by the patient.  Shortness of Breath  The current episode started yesterday. Associated symptoms include cough and shortness of breath. Pertinent negatives include no chest pain, no fever and no rhinorrhea.    Past Medical History  Diagnosis Date  . Hypertension     History reviewed. No pertinent past surgical history.  History reviewed. No pertinent family history.  History  Substance Use Topics  . Smoking status: Not on file  . Smokeless tobacco: Not on file  . Alcohol Use: No    OB History    Grav Para Term Preterm Abortions TAB SAB Ect Mult Living                  Review of Systems  Constitutional: Positive for activity change, appetite change and fatigue. Negative for fever, chills and diaphoresis.  HENT: Positive for congestion. Negative for rhinorrhea and sneezing.   Eyes: Negative.   Respiratory: Positive for cough and shortness of breath. Negative for chest tightness.   Cardiovascular: Negative for chest pain and leg swelling.  Gastrointestinal: Positive for diarrhea. Negative for nausea, vomiting, abdominal pain and blood in stool.  Genitourinary: Negative for frequency, hematuria, flank pain and difficulty  urinating.  Musculoskeletal: Negative for back pain and arthralgias.  Skin: Negative for rash.  Neurological: Negative for dizziness, speech difficulty, weakness, numbness and headaches.    Allergies  Review of patient's allergies indicates not on file.  Home Medications   Current Outpatient Rx  Name Route Sig Dispense Refill  . AMLODIPINE BESYLATE 5 MG PO TABS Oral Take 5 mg by mouth daily.      Marland Kitchen LISINOPRIL 40 MG PO TABS Oral Take 40 mg by mouth daily.      Marland Kitchen METOPROLOL SUCCINATE ER 25 MG PO TB24 Oral Take 25 mg by mouth 2 (two) times daily.     Marland Kitchen PRAVASTATIN SODIUM 40 MG PO TABS Oral Take 40 mg by mouth daily.      . TRAMADOL HCL 50 MG PO TABS Oral Take 50-100 mg by mouth every 6 (six) hours as needed. Maximum dose= 8 tablets per day. For pain     . VERAPAMIL HCL 240 MG (CO) PO TB24 Oral Take 240 mg by mouth at bedtime.        BP 143/79  Pulse 80  Temp(Src) 98.6 F (37 C) (Oral)  Resp 20  SpO2 95%  Physical Exam  Constitutional: She is oriented to person, place, and time. She appears well-developed and well-nourished.  HENT:  Head: Normocephalic and atraumatic.  Eyes: Pupils are equal, round, and reactive to light.  Neck: Normal range of motion. Neck supple.  Cardiovascular: Normal rate, regular rhythm and normal  heart sounds.   Pulmonary/Chest: Effort normal. No respiratory distress. She has no wheezes. She has rales. She exhibits no tenderness.       Rhonchi bilaterally  Abdominal: Soft. Bowel sounds are normal. There is no tenderness. There is no rebound and no guarding.  Musculoskeletal: Normal range of motion. She exhibits no edema.  Lymphadenopathy:    She has no cervical adenopathy.  Neurological: She is alert and oriented to person, place, and time.  Skin: Skin is warm and dry. No rash noted.  Psychiatric: She has a normal mood and affect.    ED Course  Procedures (including critical care time)  Results for orders placed during the hospital encounter of  07/17/11  CBC      Component Value Range   WBC 5.8  4.0 - 10.5 (K/uL)   RBC 4.07  3.87 - 5.11 (MIL/uL)   Hemoglobin 12.9  12.0 - 15.0 (g/dL)   HCT 16.1  09.6 - 04.5 (%)   MCV 92.1  78.0 - 100.0 (fL)   MCH 31.7  26.0 - 34.0 (pg)   MCHC 34.4  30.0 - 36.0 (g/dL)   RDW 40.9  81.1 - 91.4 (%)   Platelets 196  150 - 400 (K/uL)  DIFFERENTIAL      Component Value Range   Neutrophils Relative 66  43 - 77 (%)   Neutro Abs 3.8  1.7 - 7.7 (K/uL)   Lymphocytes Relative 21  12 - 46 (%)   Lymphs Abs 1.2  0.7 - 4.0 (K/uL)   Monocytes Relative 12  3 - 12 (%)   Monocytes Absolute 0.7  0.1 - 1.0 (K/uL)   Eosinophils Relative 0  0 - 5 (%)   Eosinophils Absolute 0.0  0.0 - 0.7 (K/uL)   Basophils Relative 0  0 - 1 (%)   Basophils Absolute 0.0  0.0 - 0.1 (K/uL)  BASIC METABOLIC PANEL      Component Value Range   Sodium 127 (*) 135 - 145 (mEq/L)   Potassium 4.2  3.5 - 5.1 (mEq/L)   Chloride 91 (*) 96 - 112 (mEq/L)   CO2 22  19 - 32 (mEq/L)   Glucose, Bld 106 (*) 70 - 99 (mg/dL)   BUN 31 (*) 6 - 23 (mg/dL)   Creatinine, Ser 7.82 (*) 0.50 - 1.10 (mg/dL)   Calcium 9.0  8.4 - 95.6 (mg/dL)   GFR calc non Af Amer 41 (*) >90 (mL/min)   GFR calc Af Amer 48 (*) >90 (mL/min)   Dg Chest 2 View  07/17/2011  *RADIOLOGY REPORT*  Clinical Data: Cough, shortness of breath, chest tightness  CHEST - 2 VIEW  Comparison: 03/02/2010  Findings: Stable heart size and vascularity.  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  Negative for CHF, pneumonia, collapse, consolidation, effusion, pneumothorax. Trachea midline.  Atherosclerosis of the aorta.  Chronic mid thoracic compression fracture with increased kyphosis.  IMPRESSION: Stable chest exam.  No superimposed acute process.  Original Report Authenticated By: Judie Petit. Ruel Favors, M.D.     No results found.   Date: 07/17/2011  Rate: 79  Rhythm: normal sinus rhythm  QRS Axis: left  Intervals: normal  ST/T Wave abnormalities: nonspecific ST/T changes   Conduction Disutrbances:left bundle branch block  Narrative Interpretation:   Old EKG Reviewed: none available     1. Bronchitis   2. Hypoxia   3. Hyponatremia       MDM  No evidence of pneumonia, but lungs with coarse rhonchi, likely bronchitis.  Sodium  slightly lower than baseline, creatinine slightly up.  When I attempted to turn off oxygen, sats dropped down to 89/90%.  Pt with no prior hx of lung dz.  Symptoms not consistent with PE.  Will consult hospitalist for admission        Rolan Bucco, MD 07/17/11 1527

## 2011-07-17 NOTE — H&P (Signed)
PCP:   Warrick Parisian, MD   Chief Complaint:  Shortness of breath  HPI: This is a very pleasant 70 year old female, with past medical history of hypertension. Patient describes feeling short of breath and having a cough for approximately a week now. She is initially gone to urgent care where she reports receiving some cough syrup and was told that she may have a chest cold. She had taken the cough syrup her symptoms persisted. Today she was having increased sputum production and shortness of breath she was brought to the emergency room for evaluation. She denies any recent fevers. She did have diffuse myalgias, but they're starting to improve. Her by mouth intake has been poor. She feels generally weak. She was evaluated in the ER and it was found that her oxygen saturations on room air were in the high 80s. She has been referred for admission. She reports that many of her grandchildren have been sick with similar symptoms.  Allergies:  Allergies no known allergies    Past Medical History  Diagnosis Date  . Hypertension   . HTN (hypertension) 07/17/2011  . Hyperlipidemia 07/17/2011    History reviewed. No pertinent past surgical history.  Prior to Admission medications   Medication Sig Start Date End Date Taking? Authorizing Provider  amLODipine (NORVASC) 5 MG tablet Take 5 mg by mouth daily.     Yes Historical Provider, MD  lisinopril (PRINIVIL,ZESTRIL) 40 MG tablet Take 40 mg by mouth daily.     Yes Historical Provider, MD  metoprolol succinate (TOPROL-XL) 25 MG 24 hr tablet Take 25 mg by mouth 2 (two) times daily.    Yes Historical Provider, MD  pravastatin (PRAVACHOL) 40 MG tablet Take 40 mg by mouth daily.     Yes Historical Provider, MD  traMADol (ULTRAM) 50 MG tablet Take 50-100 mg by mouth every 6 (six) hours as needed. Maximum dose= 8 tablets per day. For pain    Yes Historical Provider, MD  verapamil (COVERA HS) 240 MG (CO) 24 hr tablet Take 240 mg by mouth at bedtime.      Yes Historical Provider, MD    Social History: She reports that she has been a regular smoker most of her life.  She has been using patches now and recently stop smoking 2 days ago. She reports that she does not drink alcohol. Her drug history not on file.  History reviewed. No pertinent family history.  Review of Systems: Positives are in bold Constitutional: Denies fever, chills, diaphoresis, appetite change and fatigue.  HEENT: Denies photophobia, eye pain, redness, hearing loss, ear pain, congestion, sore throat, rhinorrhea, sneezing, mouth sores, trouble swallowing, neck pain, neck stiffness and tinnitus.   Respiratory: Denies SOB, DOE, cough, chest tightness,  and wheezing.   Cardiovascular: Denies chest pain, palpitations and leg swelling.  Gastrointestinal: Denies nausea, vomiting, abdominal pain, diarrhea, constipation, blood in stool and abdominal distention.  Genitourinary: Denies dysuria, urgency, frequency, hematuria, flank pain and difficulty urinating.  Musculoskeletal: Denies myalgias, back pain, joint swelling, arthralgias and gait problem.  Skin: Denies pallor, rash and wound.  Neurological: Denies dizziness, seizures, syncope, weakness, light-headedness, numbness and headaches.  Hematological: Denies adenopathy. Easy bruising, personal or family bleeding history  Psychiatric/Behavioral: Denies suicidal ideation, mood changes, confusion, nervousness, sleep disturbance and agitation   Physical Exam: Blood pressure 143/79, pulse 80, temperature 98.6 F (37 C), temperature source Oral, resp. rate 20, SpO2 95.00%. General: In no acute distress, lying in bed, alert and oriented x3. HEENT: Normocephalic, atraumatic, pupils are equal  round reactive to light Neck: Supple Chest diffuse bilateral rhonchi, mild expiratory wheezes at the bases Cardiac: S1, S2, regular rate and rhythm Abdomen: Soft, nontender, nondistended, bowel sounds are active Extremities: No cyanosis clubbing  or edema Neurologic: Grossly intact, nonfocal Skin: Warm, intact  Labs on Admission:  Results for orders placed during the hospital encounter of 07/17/11 (from the past 48 hour(s))  CBC     Status: Normal   Collection Time   07/17/11  2:00 PM      Component Value Range Comment   WBC 5.8  4.0 - 10.5 (K/uL)    RBC 4.07  3.87 - 5.11 (MIL/uL)    Hemoglobin 12.9  12.0 - 15.0 (g/dL)    HCT 96.0  45.4 - 09.8 (%)    MCV 92.1  78.0 - 100.0 (fL)    MCH 31.7  26.0 - 34.0 (pg)    MCHC 34.4  30.0 - 36.0 (g/dL)    RDW 11.9  14.7 - 82.9 (%)    Platelets 196  150 - 400 (K/uL)   DIFFERENTIAL     Status: Normal   Collection Time   07/17/11  2:00 PM      Component Value Range Comment   Neutrophils Relative 66  43 - 77 (%)    Neutro Abs 3.8  1.7 - 7.7 (K/uL)    Lymphocytes Relative 21  12 - 46 (%)    Lymphs Abs 1.2  0.7 - 4.0 (K/uL)    Monocytes Relative 12  3 - 12 (%)    Monocytes Absolute 0.7  0.1 - 1.0 (K/uL)    Eosinophils Relative 0  0 - 5 (%)    Eosinophils Absolute 0.0  0.0 - 0.7 (K/uL)    Basophils Relative 0  0 - 1 (%)    Basophils Absolute 0.0  0.0 - 0.1 (K/uL)   BASIC METABOLIC PANEL     Status: Abnormal   Collection Time   07/17/11  2:00 PM      Component Value Range Comment   Sodium 127 (*) 135 - 145 (mEq/L)    Potassium 4.2  3.5 - 5.1 (mEq/L)    Chloride 91 (*) 96 - 112 (mEq/L)    CO2 22  19 - 32 (mEq/L)    Glucose, Bld 106 (*) 70 - 99 (mg/dL)    BUN 31 (*) 6 - 23 (mg/dL)    Creatinine, Ser 5.62 (*) 0.50 - 1.10 (mg/dL)    Calcium 9.0  8.4 - 10.5 (mg/dL)    GFR calc non Af Amer 41 (*) >90 (mL/min)    GFR calc Af Amer 48 (*) >90 (mL/min)     Radiological Exams on Admission: Dg Chest 2 View  07/17/2011  *RADIOLOGY REPORT*  Clinical Data: Cough, shortness of breath, chest tightness  CHEST - 2 VIEW  Comparison: 03/02/2010  Findings: Stable heart size and vascularity.  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  Negative for CHF, pneumonia, collapse,  consolidation, effusion, pneumothorax. Trachea midline.  Atherosclerosis of the aorta.  Chronic mid thoracic compression fracture with increased kyphosis.  IMPRESSION: Stable chest exam.  No superimposed acute process.  Original Report Authenticated By: Judie Petit. Ruel Favors, M.D.    Assessment/Plan Active Problems:  Bronchitis  Hyponatremia  Dehydration  HTN (hypertension)  Hyperlipidemia  Plan:  Patient will be admitted to the hospital for observation overnight. We will continue her on nebulizer treatments. Will give her Avelox. We will give her mucolytics and encourage pulmonary hygiene. She has been encouraged to  continue to refrain from smoking. She is outside the window to receive any Tamiflu.  She'll be given normal saline and we will repeat a sodium level in the morning. We will also send for serum osmolarity and urine osmolarity as her sodium seems to be chronically low since July of this year. It was normal approximately one year ago. Of course this can be further worked up on an outpatient basis, as she's not having significant symptoms from her hyponatremia. We will recheck renal function in the morning after she has been hydrated. We'll ambulate her in the morning and check her oxygen saturations on room air. She will likely be able to discharge home tomorrow.  Further orders per the clinical course.  Time Spent on Admission:  Amauri Keefe [facility] 07/17/2011, 4:07 PM

## 2011-07-18 LAB — CBC
HCT: 31.9 % — ABNORMAL LOW (ref 36.0–46.0)
Hemoglobin: 11 g/dL — ABNORMAL LOW (ref 12.0–15.0)
RBC: 3.45 MIL/uL — ABNORMAL LOW (ref 3.87–5.11)
WBC: 5.5 10*3/uL (ref 4.0–10.5)

## 2011-07-18 LAB — OSMOLALITY, URINE: Osmolality, Ur: 594 mOsm/kg (ref 390–1090)

## 2011-07-18 LAB — BASIC METABOLIC PANEL
Chloride: 98 mEq/L (ref 96–112)
GFR calc Af Amer: 55 mL/min — ABNORMAL LOW (ref >90)
GFR calc non Af Amer: 48 mL/min — ABNORMAL LOW (ref >90)
Potassium: 4 mEq/L (ref 3.5–5.1)
Sodium: 131 mEq/L — ABNORMAL LOW (ref 135–145)

## 2011-07-18 MED ORDER — PREDNISONE 50 MG PO TABS
80.0000 mg | ORAL_TABLET | Freq: Every day | ORAL | Status: DC
  Administered 2011-07-18 – 2011-07-19 (×2): 80 mg via ORAL
  Filled 2011-07-18 (×4): qty 1

## 2011-07-18 MED ORDER — ROSUVASTATIN CALCIUM 5 MG PO TABS
5.0000 mg | ORAL_TABLET | Freq: Every day | ORAL | Status: DC
  Administered 2011-07-18: 5 mg via ORAL
  Filled 2011-07-18 (×3): qty 1

## 2011-07-18 MED ORDER — NICOTINE 14 MG/24HR TD PT24
14.0000 mg | MEDICATED_PATCH | Freq: Every day | TRANSDERMAL | Status: DC
  Administered 2011-07-18: 14 mg via TRANSDERMAL
  Filled 2011-07-18 (×4): qty 1

## 2011-07-18 MED ORDER — MOXIFLOXACIN HCL 400 MG PO TABS
400.0000 mg | ORAL_TABLET | Freq: Every day | ORAL | Status: DC
  Administered 2011-07-18: 400 mg via ORAL
  Filled 2011-07-18 (×3): qty 1

## 2011-07-18 MED ORDER — ZOLPIDEM TARTRATE 5 MG PO TABS
5.0000 mg | ORAL_TABLET | Freq: Every evening | ORAL | Status: DC | PRN
  Administered 2011-07-18 (×2): 5 mg via ORAL
  Filled 2011-07-18 (×2): qty 1

## 2011-07-18 MED ORDER — ZOLPIDEM TARTRATE 5 MG PO TABS: 5.0000 mg | ORAL_TABLET | Freq: Every evening | ORAL | Status: DC | PRN

## 2011-07-18 NOTE — Progress Notes (Signed)
Pt without O2 sitting was 94%.  As we began walking it quickly dropped below 90% and stayed between 84 to 86% during ambulation majority of the time.

## 2011-07-18 NOTE — Progress Notes (Signed)
Subjective: Slightly better but still with hypoxia on exertion at RA. Decreased breath sound on auscultation and scattered rhonchi bilaterally.  Objective: Vital signs in last 24 hours: Temp:  [97.8 F (36.6 C)-98.5 F (36.9 C)] 98.2 F (36.8 C) (12/24 0600) Pulse Rate:  [75-80] 75  (12/24 0600) Resp:  [20] 20  (12/24 0600) BP: (105-147)/(72-79) 110/72 mmHg (12/24 0600) SpO2:  [92 %-97 %] 96 % (12/24 0936) FiO2 (%):  [2 %] 2 % (12/23 1732) Weight:  [54.432 kg (120 lb)] 120 lb (54.432 kg) (12/23 1732) Weight change:  Last BM Date: 07/17/11  Intake/Output from previous day: 12/23 0701 - 12/24 0700 In: 1535 [P.O.:360; I.V.:1175] Out: 300 [Urine:300] Total I/O In: 120 [P.O.:120] Out: -    Physical Exam: General: Alert, awake, oriented x3, in no acute distress. HEENT: No bruits, no goiter. Heart: Regular rate and rhythm, without murmurs, rubs, gallops. Lungs: decreased breath sound; scattered rhonchi Abdomen: Soft, nontender, nondistended, positive bowel sounds. Extremities: No clubbing cyanosis or edema with positive pedal pulses. Neuro: Grossly intact, nonfocal.   Lab Results: Basic Metabolic Panel:  Basename 07/18/11 0510 07/17/11 1400  NA 131* 127*  K 4.0 4.2  CL 98 91*  CO2 23 22  GLUCOSE 89 106*  BUN 28* 31*  CREATININE 1.14* 1.28*  CALCIUM 8.3* 9.0  MG -- --  PHOS -- --   CBC:  Basename 07/18/11 0510 07/17/11 1400  WBC 5.5 5.8  NEUTROABS -- 3.8  HGB 11.0* 12.9  HCT 31.9* 37.5  MCV 92.5 92.1  PLT 198 196    Studies/Results: Dg Chest 2 View  07/17/2011  *RADIOLOGY REPORT*  Clinical Data: Cough, shortness of breath, chest tightness  CHEST - 2 VIEW  Comparison: 03/02/2010  Findings: Stable heart size and vascularity.  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  Negative for CHF, pneumonia, collapse, consolidation, effusion, pneumothorax. Trachea midline.  Atherosclerosis of the aorta.  Chronic mid thoracic compression fracture with  increased kyphosis.  IMPRESSION: Stable chest exam.  No superimposed acute process.  Original Report Authenticated By: Judie Petit. Ruel Favors, M.D.    Medications: Scheduled Meds:   . sodium chloride   Intravenous STAT  . albuterol  2.5 mg Nebulization Q6H  . cefTRIAXone (ROCEPHIN)  IV  1 g Intravenous Once  . enoxaparin  40 mg Subcutaneous Q24H  . guaiFENesin  600 mg Oral BID  . ipratropium  0.5 mg Nebulization Q6H  . lisinopril  40 mg Oral Daily  . metoprolol succinate  25 mg Oral BID  . moxifloxacin  400 mg Intravenous Once  . moxifloxacin  400 mg Oral q1800  . nicotine  14 mg Transdermal Daily  . predniSONE  80 mg Oral Daily  . rosuvastatin  5 mg Oral q1800  . verapamil  240 mg Oral QHS  . DISCONTD: moxifloxacin  400 mg Oral q1800  . DISCONTD: simvastatin  20 mg Oral q1800   Continuous Infusions:   . sodium chloride 100 mL/hr at 07/18/11 1452   PRN Meds:.acetaminophen, acetaminophen, albuterol, ondansetron (ZOFRAN) IV, ondansetron, traMADol, zolpidem, DISCONTD: zolpidem  Assessment/Plan: 1- hypoxia: most likely 2/2 to COPD exacerbation and bronchiectasis. Start prednisone, continue antibiotics and continue supplemental O2.  2-Hyponatremia: 2/2 decrease intake; now pretty much back to normal after IVF's resuscitation. Patient drinking and eating better.  3-Dehydration: resolved with IVF's  4-HTN (hypertension):Continue current regimen; BP well controlled.  5-Hyperlipidemia: continue statins    LOS: 1 day   Kahmari Herard [facility] [phone]  07/18/2011, 3:03 PM

## 2011-07-18 NOTE — Progress Notes (Signed)
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  Per P&T protocol, pharmacy will substitute all simvastatin > 10 mg with rosuvastatin (Crestor) 1mg  for each 4 mg of simvastatin.  Please be advise of this change inpatient and at discharge if applicable.  Thank you.    If you have questions about this conversion, please contact the Pharmacy Department  []   [phone] )  [facility] []   [phone] )  [facility]  []   [phone] )  [facility] [x]   [phone] )  [facility]

## 2011-07-19 DIAGNOSIS — R0902 Hypoxemia: Secondary | ICD-10-CM | POA: Diagnosis present

## 2011-07-19 LAB — LEGIONELLA ANTIGEN, URINE: Legionella Antigen, Urine: NEGATIVE

## 2011-07-19 MED ORDER — PREDNISONE 20 MG PO TABS: ORAL_TABLET | ORAL | Status: DC

## 2011-07-19 MED ORDER — MOXIFLOXACIN HCL 400 MG PO TABS: 400.0000 mg | ORAL_TABLET | Freq: Every day | ORAL | Status: AC

## 2011-07-19 MED ORDER — GUAIFENESIN ER 600 MG PO TB12: 600.0000 mg | ORAL_TABLET | Freq: Two times a day (BID) | ORAL | Status: AC

## 2011-07-19 MED ORDER — IPRATROPIUM-ALBUTEROL 18-103 MCG/ACT IN AERO: 2.0000 | INHALATION_SPRAY | Freq: Four times a day (QID) | RESPIRATORY_TRACT | Status: DC | PRN

## 2011-07-19 NOTE — Progress Notes (Signed)
We have spent the afternoon communicating with her regarding obtaining her home 02.  They have just arrived with her portable tank.  She has been comfortable all afternoon, and we have gotten her many drinks; and RT has been giving her scheduled h.h.n. Treatments.  She leaves at this time per w/c without incident.  She is in no distress.

## 2011-07-19 NOTE — Progress Notes (Signed)
Patients oxygen saturation dropped to 85% with ambulation on room air.  Also became very short of breath.  Once patient seated and placed back on O2 at 2 liters oxygen saturation returned to 94-95%.

## 2011-07-19 NOTE — Discharge Summary (Signed)
Physician Discharge Summary  Patient ID: Melanie Williamson MRN: [id number] DOB/AGE: [birth year] 70 y.o.  Admit date: 07/17/2011 Discharge date: 07/19/2011  Primary Care Physician:  Warrick Parisian, MD   Discharge Diagnoses:   1-mild COPD exacerbation with hypoxia (no formal PFTs has been done) 2-bronchiectasis 3-dehydration and hyponatremia 4-hyperlipidemia 5-tobacco abuse 6-kyphosis and chronic thoracic compression fractures 7-history of chronic back pain (most likely secondary to #6)  Present on Admission:  .Bronchitis .Hyponatremia .Dehydration .HTN (hypertension) .Hyperlipidemia .Hypoxia  Current Discharge Medication List    START taking these medications   Details  albuterol-ipratropium (COMBIVENT) 18-103 MCG/ACT inhaler Inhale 2 puffs into the lungs every 6 (six) hours as needed for wheezing or shortness of breath. Qty: 1 Inhaler, Refills: 1    guaiFENesin (MUCINEX) 600 MG 12 hr tablet Take 1 tablet (600 mg total) by mouth 2 (two) times daily. Qty: 30 tablet, Refills: 0    moxifloxacin (AVELOX) 400 MG tablet Take 1 tablet (400 mg total) by mouth daily at 6 PM. Qty: 7 tablet, Refills: 0    predniSONE (DELTASONE) 20 MG tablet Take 3 tablets by mouth daily times one 2 days; then take 2 tablets by mouth daily x2 days; then take 1 tablets by mouth daily x2 days; then take 1/2 tablet by mouth daily x2 days and stop taking prednisone. Qty: 13 tablet, Refills: 0      CONTINUE these medications which have NOT CHANGED   Details  amLODipine (NORVASC) 5 MG tablet Take 5 mg by mouth daily.      lisinopril (PRINIVIL,ZESTRIL) 40 MG tablet Take 40 mg by mouth daily.      metoprolol succinate (TOPROL-XL) 25 MG 24 hr tablet Take 25 mg by mouth 2 (two) times daily.     pravastatin (PRAVACHOL) 40 MG tablet Take 40 mg by mouth daily.      traMADol (ULTRAM) 50 MG tablet Take 50-100 mg by mouth every 6 (six) hours as needed. Maximum dose= 8 tablets per day. For pain     verapamil  (COVERA HS) 240 MG (CO) 24 hr tablet Take 240 mg by mouth at bedtime.           Disposition and Follow-up:  Patient discharged in a stable and improved condition, currently no complaining of any chest pain; good oxygen saturation now on 2 L nasal cannula oxygen, no fever/shields and with good by mouth intake. Patient instructed to stop smoking, to take medications as prescribed and to arrange followup with PCP over the next 1-2 weeks for further evaluation and treatment. She had now been started on Combivent as needed to help with shortness of breath and wheezing. She was instructed to follow a low-sodium/heart healthy diet and to take the rest of her medications as previously instructed. Of note patient with x-ray findings suggesting COPD/emphysema dose changes. No PFTs has been done; this is something that she will benefit of having done as an outpatient to determine further long-term treatment of her condition.  Consults:   None   Significant Diagnostic Studies:  Dg Chest 2 View  07/17/2011  *RADIOLOGY REPORT*  Clinical Data: Cough, shortness of breath, chest tightness  CHEST - 2 VIEW  Comparison: 03/02/2010  Findings: Stable heart size and vascularity.  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  Negative for CHF, pneumonia, collapse, consolidation, effusion, pneumothorax. Trachea midline.  Atherosclerosis of the aorta.  Chronic mid thoracic compression fracture with increased kyphosis.  IMPRESSION: Stable chest exam.  No superimposed acute process.  Original Report Authenticated By: Judie Petit. TREVOR  SHICK, M.D.    Brief H and P: 70 year old female with a past medical history significant for hypertension, tobacco abuse and hyperlipidemia, who came into the hospital secondary to increase shortness of breath/cough over the last 3-4 days prior to admission. Patient endorses that her symptoms were getting worse and during her evaluation in the emergency department she was found to be  hypoxic with oxygen saturation in the low 80s. A chest x-ray Demonstrated a stable chest X. and with just chronic emphysema dose changes. The patient was admitted to the hospital by [facility] secondary to her hypoxia most likely due to mild COPD exacerbation of bronchiectasis.  Hospital Course:  1-hypoxia: Most likely secondary to COPD exacerbation of bronchiectasis. Patient was started on prednisone tapering, she has also been started on Avelox 400 mg by mouth daily and when necessary Mucinex to help with her symptoms. Patient was evaluated throughout this hospitalization with oxygen saturation on room air and exertion that demonstrated desats when she was in the waiting supplement oxygen. Oxygen as an outpatient deliver through nasal cannula, 2 L; in order to maintain an oxygen saturation above 90% has been arranged. Patient advised to stop smoking and to arrange followup with primary care physician over the next 1-2 weeks for further evaluation and treatment.  2-hyponatremia: Secondary to decreased intake and dehydration; after fluid resuscitation given him primary condition treated. Patient was able to tolerate by mouth intake without difficulties and her sodium level is within normal limits now.  3-dehydration: Secondary to poor by mouth intake. Resolve after IV fluid resuscitation.  4-hypertension: Well controlled and stable; patient will continue current antihypertensive regimen and has been advised to follow a low-sodium/heart healthy diet. Further adjustment to be done as needed by PCP and her followup appointment.  5-hyperlipidemia: Continue statins.  6-chronic compression fractures/kyphosis with chronic back pain: Continue when necessary tramadol; patient will continue following with hair PCP for further evaluation and treatment of this condition.  The rest of her medical problems remains stable and no changes has been made to her medication regimen.  Time spent on  Discharge: 45 minutes  Signed: Lovie Zarling 07/19/2011, 11:24 AM

## 2011-07-19 NOTE — Progress Notes (Signed)
CM contacted by Herbert Seta, RN to assist with portable oxygen for discharge home on 07/19/11.  Confirmed on 07/18/2011 2:32 PM Pt without O2 sitting was 94%. With walking it quickly dropped below 90% and stayed between 84 to 86% during ambulation majority of the time.  [facility] and [facility] for services. Orders to be faxed by RN to [phone]. Spoke with Audrie Lia at [phone] to confirm Oxygen to be delivered to [facility] rm [facility]. Referral completed via [facility].

## 2013-10-03 ENCOUNTER — Observation Stay (HOSPITAL_COMMUNITY)
Admission: EM | Admit: 2013-10-03 | Discharge: 2013-10-05 | Disposition: A | Discharge status: Home / Self Care | Payer: Medicare Other | Attending: Internal Medicine | Admitting: Internal Medicine

## 2013-10-03 ENCOUNTER — Encounter (HOSPITAL_COMMUNITY): Payer: Self-pay | Admitting: Emergency Medicine

## 2013-10-03 ENCOUNTER — Emergency Department (HOSPITAL_COMMUNITY): Payer: Medicare Other

## 2013-10-03 DIAGNOSIS — J40 Bronchitis, not specified as acute or chronic: Secondary | ICD-10-CM

## 2013-10-03 DIAGNOSIS — J441 Chronic obstructive pulmonary disease with (acute) exacerbation: Secondary | ICD-10-CM | POA: Insufficient documentation

## 2013-10-03 DIAGNOSIS — R233 Spontaneous ecchymoses: Secondary | ICD-10-CM | POA: Diagnosis present

## 2013-10-03 DIAGNOSIS — R059 Cough, unspecified: Secondary | ICD-10-CM | POA: Insufficient documentation

## 2013-10-03 DIAGNOSIS — Z79899 Other long term (current) drug therapy: Secondary | ICD-10-CM | POA: Insufficient documentation

## 2013-10-03 DIAGNOSIS — J3489 Other specified disorders of nose and nasal sinuses: Secondary | ICD-10-CM | POA: Insufficient documentation

## 2013-10-03 DIAGNOSIS — J9610 Chronic respiratory failure, unspecified whether with hypoxia or hypercapnia: Secondary | ICD-10-CM

## 2013-10-03 DIAGNOSIS — J9611 Chronic respiratory failure with hypoxia: Secondary | ICD-10-CM | POA: Diagnosis present

## 2013-10-03 DIAGNOSIS — F17200 Nicotine dependence, unspecified, uncomplicated: Secondary | ICD-10-CM | POA: Insufficient documentation

## 2013-10-03 DIAGNOSIS — I10 Essential (primary) hypertension: Secondary | ICD-10-CM | POA: Diagnosis present

## 2013-10-03 DIAGNOSIS — I2510 Atherosclerotic heart disease of native coronary artery without angina pectoris: Secondary | ICD-10-CM

## 2013-10-03 DIAGNOSIS — R238 Other skin changes: Secondary | ICD-10-CM | POA: Diagnosis present

## 2013-10-03 DIAGNOSIS — E871 Hypo-osmolality and hyponatremia: Secondary | ICD-10-CM | POA: Diagnosis present

## 2013-10-03 DIAGNOSIS — Z9981 Dependence on supplemental oxygen: Secondary | ICD-10-CM | POA: Insufficient documentation

## 2013-10-03 DIAGNOSIS — R05 Cough: Secondary | ICD-10-CM | POA: Insufficient documentation

## 2013-10-03 DIAGNOSIS — E785 Hyperlipidemia, unspecified: Secondary | ICD-10-CM | POA: Diagnosis present

## 2013-10-03 DIAGNOSIS — R079 Chest pain, unspecified: Secondary | ICD-10-CM | POA: Diagnosis present

## 2013-10-03 DIAGNOSIS — R0789 Other chest pain: Principal | ICD-10-CM | POA: Insufficient documentation

## 2013-10-03 DIAGNOSIS — R509 Fever, unspecified: Secondary | ICD-10-CM | POA: Insufficient documentation

## 2013-10-03 DIAGNOSIS — J449 Chronic obstructive pulmonary disease, unspecified: Secondary | ICD-10-CM | POA: Diagnosis present

## 2013-10-03 DIAGNOSIS — I447 Left bundle-branch block, unspecified: Secondary | ICD-10-CM

## 2013-10-03 LAB — CBC WITH DIFFERENTIAL/PLATELET
BASOS PCT: 1 % (ref 0–1)
Basophils Absolute: 0 10*3/uL (ref 0.0–0.1)
EOS ABS: 0 10*3/uL (ref 0.0–0.7)
EOS PCT: 1 % (ref 0–5)
HCT: 37.9 % (ref 36.0–46.0)
Hemoglobin: 13.2 g/dL (ref 12.0–15.0)
Lymphocytes Relative: 24 % (ref 12–46)
Lymphs Abs: 1.2 10*3/uL (ref 0.7–4.0)
MCH: 31.6 pg (ref 26.0–34.0)
MCHC: 34.8 g/dL (ref 30.0–36.0)
MCV: 90.7 fL (ref 78.0–100.0)
MONOS PCT: 12 % (ref 3–12)
Monocytes Absolute: 0.6 10*3/uL (ref 0.1–1.0)
NEUTROS PCT: 62 % (ref 43–77)
Neutro Abs: 3.1 10*3/uL (ref 1.7–7.7)
PLATELETS: 229 10*3/uL (ref 150–400)
RBC: 4.18 MIL/uL (ref 3.87–5.11)
RDW: 13 % (ref 11.5–15.5)
WBC: 5 10*3/uL (ref 4.0–10.5)

## 2013-10-03 LAB — COMPREHENSIVE METABOLIC PANEL
ALBUMIN: 3.7 g/dL (ref 3.5–5.2)
ALK PHOS: 108 U/L (ref 39–117)
ALT: 30 U/L (ref 0–35)
AST: 34 U/L (ref 0–37)
BUN: 14 mg/dL (ref 6–23)
CALCIUM: 9.1 mg/dL (ref 8.4–10.5)
CO2: 20 mEq/L (ref 19–32)
Chloride: 97 mEq/L (ref 96–112)
Creatinine, Ser: 1.15 mg/dL — ABNORMAL HIGH (ref 0.50–1.10)
GFR calc Af Amer: 54 mL/min — ABNORMAL LOW (ref >90)
GFR calc non Af Amer: 46 mL/min — ABNORMAL LOW (ref >90)
Glucose, Bld: 108 mg/dL — ABNORMAL HIGH (ref 70–99)
POTASSIUM: 4.5 meq/L (ref 3.7–5.3)
Sodium: 136 mEq/L — ABNORMAL LOW (ref 137–147)
TOTAL PROTEIN: 7 g/dL (ref 6.0–8.3)
Total Bilirubin: <0.2 mg/dL — ABNORMAL LOW (ref 0.3–1.2)

## 2013-10-03 LAB — APTT: aPTT: 25 seconds (ref 24–37)

## 2013-10-03 LAB — D-DIMER, QUANTITATIVE (NOT AT ARMC): D DIMER QUANT: 16.06 ug{FEU}/mL — AB (ref 0.00–0.48)

## 2013-10-03 LAB — PROTIME-INR
INR: 0.88 (ref 0.00–1.49)
PROTHROMBIN TIME: 11.8 s (ref 11.6–15.2)

## 2013-10-03 LAB — TROPONIN I

## 2013-10-03 MED ORDER — ENOXAPARIN SODIUM 40 MG/0.4ML ~~LOC~~ SOLN
40.0000 mg | Freq: Every day | SUBCUTANEOUS | Status: DC
  Administered 2013-10-03 – 2013-10-04 (×2): 40 mg via SUBCUTANEOUS
  Filled 2013-10-03 (×3): qty 0.4

## 2013-10-03 MED ORDER — IPRATROPIUM-ALBUTEROL 0.5-2.5 (3) MG/3ML IN SOLN: 3.0000 mL | Freq: Four times a day (QID) | RESPIRATORY_TRACT | Status: DC | PRN

## 2013-10-03 MED ORDER — TRAMADOL HCL 50 MG PO TABS: 50.0000 mg | ORAL_TABLET | Freq: Four times a day (QID) | ORAL | Status: DC | PRN

## 2013-10-03 MED ORDER — AMLODIPINE BESYLATE 5 MG PO TABS
5.0000 mg | ORAL_TABLET | Freq: Every day | ORAL | Status: DC
  Administered 2013-10-03 – 2013-10-05 (×3): 5 mg via ORAL
  Filled 2013-10-03 (×3): qty 1

## 2013-10-03 MED ORDER — GI COCKTAIL ~~LOC~~: 30.0000 mL | Freq: Four times a day (QID) | ORAL | Status: DC | PRN

## 2013-10-03 MED ORDER — ALPRAZOLAM 0.5 MG PO TABS
0.5000 mg | ORAL_TABLET | Freq: Every day | ORAL | Status: DC | PRN
  Administered 2013-10-04 (×2): 0.5 mg via ORAL
  Filled 2013-10-03 (×2): qty 1

## 2013-10-03 MED ORDER — ATORVASTATIN CALCIUM 10 MG PO TABS
10.0000 mg | ORAL_TABLET | Freq: Every day | ORAL | Status: DC
  Administered 2013-10-03 – 2013-10-04 (×2): 10 mg via ORAL
  Filled 2013-10-03 (×3): qty 1

## 2013-10-03 MED ORDER — VERAPAMIL HCL ER 240 MG PO TBCR
240.0000 mg | EXTENDED_RELEASE_TABLET | Freq: Every day | ORAL | Status: DC
  Administered 2013-10-03 – 2013-10-04 (×2): 240 mg via ORAL
  Filled 2013-10-03 (×4): qty 1

## 2013-10-03 MED ORDER — ALBUTEROL SULFATE (2.5 MG/3ML) 0.083% IN NEBU
5.0000 mg | INHALATION_SOLUTION | Freq: Once | RESPIRATORY_TRACT | Status: AC
  Administered 2013-10-03: 5 mg via RESPIRATORY_TRACT
  Filled 2013-10-03: qty 6

## 2013-10-03 MED ORDER — METOPROLOL SUCCINATE ER 25 MG PO TB24
25.0000 mg | ORAL_TABLET | Freq: Two times a day (BID) | ORAL | Status: DC
  Administered 2013-10-03 – 2013-10-04 (×2): 25 mg via ORAL
  Filled 2013-10-03 (×3): qty 1

## 2013-10-03 MED ORDER — VERAPAMIL HCL 240 MG (CO) PO TB24: 240.0000 mg | ORAL_TABLET | Freq: Every day | ORAL | Status: DC

## 2013-10-03 MED ORDER — ASPIRIN EC 325 MG PO TBEC
325.0000 mg | DELAYED_RELEASE_TABLET | Freq: Every day | ORAL | Status: DC
  Administered 2013-10-04 – 2013-10-05 (×2): 325 mg via ORAL
  Filled 2013-10-03 (×2): qty 1

## 2013-10-03 MED ORDER — ALBUTEROL SULFATE (2.5 MG/3ML) 0.083% IN NEBU: 3.0000 mL | INHALATION_SOLUTION | Freq: Every day | RESPIRATORY_TRACT | Status: DC | PRN

## 2013-10-03 MED ORDER — SIMVASTATIN 40 MG PO TABS: 40.0000 mg | ORAL_TABLET | Freq: Every day | ORAL | Status: DC

## 2013-10-03 MED ORDER — IPRATROPIUM-ALBUTEROL 18-103 MCG/ACT IN AERO: 2.0000 | INHALATION_SPRAY | Freq: Four times a day (QID) | RESPIRATORY_TRACT | Status: DC | PRN

## 2013-10-03 MED ORDER — LISINOPRIL 40 MG PO TABS
40.0000 mg | ORAL_TABLET | Freq: Every day | ORAL | Status: DC
  Administered 2013-10-03 – 2013-10-05 (×3): 40 mg via ORAL
  Filled 2013-10-03 (×3): qty 1

## 2013-10-03 NOTE — H&P (Signed)
[facility] History and Physical  Patient: Melanie Williamson  VQQ:[id number]  DOB: 1941/07/25  DOS: the patient was seen and examined on 10/03/2013 PCP: Warrick Parisian, MD  Chief Complaint: Shortness of breath and chest pain  HPI: Melanie Williamson is a 73 y.o. female with Past medical history of hypertension, dyslipidemia, questionable COPD, chronic respiratory failure on 2 L of oxygen at night. The patient is coming from home. Patient presented with complaints of cough and check chest pain associated with shortness of breath. She mentions that since last one week she has been having complaints of sore throat and cough with whitish expectoration, with mild worsening of her breathing on exertion. Today she had an episode of chest tightness located substernally nonradiating, this felt like to taking pain worsened with breathing as well. She denies any fever or chills denies any nausea or vomiting denies any acid reflux. She mentions she has similar chest tightness all the time and she has upper respiratory infection. She mentions she bruises easily. She denies any orthopnea PND or leg swelling, denies any recent travel or immobilization or surgery. Denies any change in her medication. Mentions he is compliant with her medications. Today she went to see her PCP for complaint of upper respiratory infection and since she had complaints of chest pain she was brought to the ED.  Review of Systems: as mentioned in the history of present illness.  A Comprehensive review of the other systems is negative.  Past Medical History  Diagnosis Date  . Hypertension   . HTN (hypertension) 07/17/2011  . Hyperlipidemia 07/17/2011   History reviewed. No pertinent past surgical history. Social History:  reports that she has been smoking.  She does not have any smokeless tobacco history on file. She reports that she does not drink alcohol. Her drug history is not on file. Independent for most of her  ADL.  No  Known Allergies  History reviewed. No pertinent family history.  Prior to Admission medications   Medication Sig Start Date End Date Taking? Authorizing Provider  albuterol-ipratropium (COMBIVENT) 18-103 MCG/ACT inhaler Inhale 2 puffs into the lungs every 6 (six) hours as needed for wheezing or shortness of breath. 07/19/11 10/03/13 Yes Armen Pickup, MD  ALPRAZolam Prudy Feeler) 0.5 MG tablet Take 0.5 mg by mouth daily as needed for anxiety.  09/24/13  Yes Historical Provider, MD  amLODipine (NORVASC) 5 MG tablet Take 5 mg by mouth daily.     Yes Historical Provider, MD  ibuprofen (ADVIL,MOTRIN) 200 MG tablet Take 600 mg by mouth every 6 (six) hours as needed for moderate pain.   Yes Historical Provider, MD  lisinopril (PRINIVIL,ZESTRIL) 40 MG tablet Take 40 mg by mouth daily.     Yes Historical Provider, MD  metoprolol succinate (TOPROL-XL) 25 MG 24 hr tablet Take 25 mg by mouth 2 (two) times daily.    Yes Historical Provider, MD  pravastatin (PRAVACHOL) 40 MG tablet Take 40 mg by mouth daily.     Yes Historical Provider, MD  PROAIR HFA 108 (90 BASE) MCG/ACT inhaler Inhale 2 puffs into the lungs daily as needed for wheezing or shortness of breath.  09/11/13  Yes Historical Provider, MD  traMADol (ULTRAM) 50 MG tablet Take 50-100 mg by mouth every 6 (six) hours as needed. Maximum dose= 8 tablets per day. For pain    Yes Historical Provider, MD  verapamil (COVERA HS) 240 MG (CO) 24 hr tablet Take 240 mg by mouth at bedtime.     Yes  Historical Provider, MD    Physical Exam: Filed Vitals:   10/03/13 1715 10/03/13 1730 10/03/13 2020 10/03/13 2118  BP: 171/65 159/74  127/85  Pulse: 96 93  137  Temp:    98.2 F (36.8 C)  TempSrc:    Oral  Resp: 23 18  22   Height:    5' (1.524 m)  Weight:    70.353 kg (155 lb 1.6 oz)  SpO2: 97% 97% 98% 100%    General: Alert, Awake and Oriented to Time, Place and Person. Appear in mild distress Eyes: PERRL ENT: Oral Mucosa clear moist. Neck:  no  JVD Cardiovascular: S1 and S2 Present,  no  Murmur, Peripheral Pulses Present Respiratory: Bilateral Air entry equal and Decreased, Clear to Auscultation,   no  Crackles, not wheezes Abdomen: Bowel Sound Present, Soft and Non tender Skin: diffuse bruises, no  Rash Extremities:  no  Pedal edema,  no  calf tenderness Neurologic: Grossly Unremarkable. Labs on Admission:  CBC:  Recent Labs Lab 10/03/13 1900  WBC 5.0  NEUTROABS 3.1  HGB 13.2  HCT 37.9  MCV 90.7  PLT 229    CMP     Component Value Date/Time   NA 136* 10/03/2013 1900   K 4.5 10/03/2013 1900   CL 97 10/03/2013 1900   CO2 20 10/03/2013 1900   GLUCOSE 108* 10/03/2013 1900   BUN 14 10/03/2013 1900   CREATININE 1.15* 10/03/2013 1900   CALCIUM 9.1 10/03/2013 1900   PROT 7.0 10/03/2013 1900   ALBUMIN 3.7 10/03/2013 1900   AST 34 10/03/2013 1900   ALT 30 10/03/2013 1900   ALKPHOS 108 10/03/2013 1900   BILITOT <0.2* 10/03/2013 1900   GFRNONAA 46* 10/03/2013 1900   GFRAA 54* 10/03/2013 1900    No results found for this basename: LIPASE, AMYLASE,  in the last 168 hours No results found for this basename: AMMONIA,  in the last 168 hours   Recent Labs Lab 10/03/13 1900  TROPONINI <0.30   BNP (last 3 results) No results found for this basename: PROBNP,  in the last 8760 hours  Radiological Exams on Admission: Dg Chest 2 View  10/03/2013   CLINICAL DATA:  Chest pain.  EXAM: CHEST  2 VIEW  COMPARISON:  July 17, 2011.  FINDINGS: The heart size and mediastinal contours are within normal limits. Both lungs are clear. No pneumothorax or pleural effusion is noted. Stable severe compression deformity of mid thoracic vertebral body is noted. Atherosclerotic calcifications of thoracic aorta are noted.  IMPRESSION: No acute cardiopulmonary abnormality seen.   Electronically Signed   By: Roque Lias M.D.   On: 10/03/2013 18:18    EKG: Independently reviewed. normal EKG, normal sinus rhythm, LBBB.  Assessment/Plan Principal  Problem:   Chest pain Active Problems:   Hyponatremia   HTN (hypertension)   Hyperlipidemia   COPD (chronic obstructive pulmonary disease)   Chronic respiratory failure   Easy bruisability   1. Chest pain  the patient presented with complaints of chest pain and shortness of breath. Her initial EKG and troponins are negative. She is currently pain-free shortness of breath 3. She will be admitted to the hospital I would follow serial troponins, EKG, echocardiogram, telemetry monitoring. She is at low risk for PE of adopting a d-dimer. I will place her on aspirin. I will keep her n.p.o. for possible stress test  2. easy bruisability  Check INR and APTT   3. COPD  Patient had wheezing downstairs in the ED at present  she does not have any significant wheezing or complain of cough chest x-ray is clear she is not hypoxic  At present I will continue her home Combivent and monitor   4. hypertension   continue her on the metoprolol, verapamil and other home antihypertensives  5. Hyponatremia Appears chronic continue to monitor  DVT Prophylaxis: subcutaneous Heparin Nutrition:  cardiac diet and n.p.o.  Code Status:  full  Family Communication:  family  was present at bedside, opportunity was given to ask question and all questions were answered satisfactorily at the time of interview. Disposition: Admitted to observation in telemetry unit.  Author: Lynden OxfordPranav Sherry Blackard, MD [facility] Pager: [phone] 10/03/2013, 10:09 PM    If 7PM-7AM, please contact night-coverage [email or web address] Password [email or web address]

## 2013-10-03 NOTE — ED Notes (Signed)
Pt from home, c/o chest pain center of chest, Call ems, pain at 3 upon there arrival, denies pain at this time, denies n/v/sob. No signs of distress.

## 2013-10-03 NOTE — Progress Notes (Signed)
Patient transferred from ED to [phone]. No active complaints, but HR is elevated ~ 140, sustained. Notified CCMD of patient arrival and paged admitting MD for orders.  Continuing to monitor.

## 2013-10-03 NOTE — ED Provider Notes (Signed)
CSN: [id number]     Arrival date & time 10/03/13  1623 History   First MD Initiated Contact with Patient 10/03/13 1646     Chief Complaint  Patient presents with  . Chest Pain     (Consider location/radiation/quality/duration/timing/severity/associated sxs/prior Treatment) The history is provided by the patient. No language interpreter was used.  Melanie Williamson is a 73 y/o F with PMHx of HTN, HLD, COPD currently diagnosed approximately 2 years currently on 2 L/min of oxygen nasal cannula at night presenting to the ED with chest pain that started yesterday. Patient reported that the pain was localized to the left side of the chest described as a sharp pain that lasted a couple of hours. Stated that when she woke up this morning she started to experiencing chest pain localized to the center of the chest that was described as a light dull aching pain that has been intermittent throughout the day. [atient reported that she has been having nasal congestion, and cough with white sputum. Stated that she had a fever last night of 102F - reported that she took a Tylenol for relief. Stated that she has been having nasal congestion. Reported that she was [facility] at [facility] who transferred patient to the ED to be assessed. Denied neck pain, neck stiffness, shortness of breath, difficulty breathing, numbness, weakness, nausea, vomiting, diarrhea, abdominal pain, melena, hematochezia. PCP Dr. Eldred MangesStalling   Past Medical History  Diagnosis Date  . Hypertension   . HTN (hypertension) 07/17/2011  . Hyperlipidemia 07/17/2011   History reviewed. No pertinent past surgical history. History reviewed. No pertinent family history. History  Substance Use Topics  . Smoking status: Current Every Day Smoker  . Smokeless tobacco: Not on file  . Alcohol Use: No   OB History   Grav Para Term Preterm Abortions TAB SAB Ect Mult Living                 Review of Systems  Constitutional: Positive  for fever (subjective). Negative for chills.  HENT: Positive for congestion. Negative for sore throat and trouble swallowing.   Respiratory: Positive for cough and shortness of breath.   Cardiovascular: Positive for chest pain.  Gastrointestinal: Negative for nausea, vomiting, abdominal pain and diarrhea.  Neurological: Negative for dizziness and weakness.  All other systems reviewed and are negative.      Allergies  Review of patient's allergies indicates no known allergies.  Home Medications   Current Outpatient Rx  Name  Route  Sig  Dispense  Refill  . albuterol-ipratropium (COMBIVENT) 18-103 MCG/ACT inhaler   Inhalation   Inhale 2 puffs into the lungs every 6 (six) hours as needed for wheezing or shortness of breath.   1 Inhaler   1   . ALPRAZolam (XANAX) 0.5 MG tablet   Oral   Take 0.5 mg by mouth daily as needed for anxiety.          Marland Kitchen. amLODipine (NORVASC) 5 MG tablet   Oral   Take 5 mg by mouth daily.           Marland Kitchen. ibuprofen (ADVIL,MOTRIN) 200 MG tablet   Oral   Take 600 mg by mouth every 6 (six) hours as needed for moderate pain.         Marland Kitchen. lisinopril (PRINIVIL,ZESTRIL) 40 MG tablet   Oral   Take 40 mg by mouth daily.           . metoprolol succinate (TOPROL-XL) 25 MG 24  hr tablet   Oral   Take 25 mg by mouth 2 (two) times daily.          . pravastatin (PRAVACHOL) 40 MG tablet   Oral   Take 40 mg by mouth daily.           Marland Kitchen PROAIR HFA 108 (90 BASE) MCG/ACT inhaler   Inhalation   Inhale 2 puffs into the lungs daily as needed for wheezing or shortness of breath.          . traMADol (ULTRAM) 50 MG tablet   Oral   Take 50-100 mg by mouth every 6 (six) hours as needed. Maximum dose= 8 tablets per day. For pain          . verapamil (COVERA HS) 240 MG (CO) 24 hr tablet   Oral   Take 240 mg by mouth at bedtime.            BP 159/74  Pulse 93  Temp(Src) 98.7 F (37.1 C)  Resp 18  SpO2 98% Physical Exam  Nursing note and vitals  reviewed. Constitutional: She is oriented to person, place, and time. She appears well-developed and well-nourished. No distress.  HENT:  Head: Normocephalic and atraumatic.  Mouth/Throat: Oropharynx is clear and moist. No oropharyngeal exudate.  Eyes: Conjunctivae and EOM are normal. Pupils are equal, round, and reactive to light. Right eye exhibits no discharge. Left eye exhibits no discharge.  Neck: Normal range of motion. Neck supple.  Cardiovascular: Normal rate, regular rhythm and normal heart sounds.   Negative leg swelling or pitting edema noted to bilateral lower extremities Cap refill < 3 seconds  Pulmonary/Chest: Effort normal. She has wheezes. She has no rales. She exhibits no tenderness.  Decreased breath sounds to upper and lower lobes bilaterally Expiratory wheezes noted to the left upper lobe   Negative stridor Negative use of accessory muscles Patient is able to speak in full sentences without difficulty  Musculoskeletal: Normal range of motion.  Full ROM to upper and lower extremities without difficulty noted, negative ataxia noted.  Neurological: She is alert and oriented to person, place, and time. No cranial nerve deficit. She exhibits normal muscle tone. Coordination normal.  Skin: Skin is warm and dry. No rash noted. She is not diaphoretic. No erythema.  Psychiatric: She has a normal mood and affect. Her behavior is normal. Thought content normal.    ED Course  Procedures (including critical care time)  This provider spoke with Dr. Allena Katz, Hospitalist - discussed case, history, presentation, labs, imaging in great detail. Patient to be admitted to Telemetry observation.   Labs Review Labs Reviewed  COMPREHENSIVE METABOLIC PANEL - Abnormal; Notable for the following:    Sodium 136 (*)    Glucose, Bld 108 (*)    Creatinine, Ser 1.15 (*)    Total Bilirubin <0.2 (*)    GFR calc non Af Amer 46 (*)    GFR calc Af Amer 54 (*)    All other components within normal  limits  CBC WITH DIFFERENTIAL  TROPONIN I  D-DIMER, QUANTITATIVE   Imaging Review Dg Chest 2 View  10/03/2013   CLINICAL DATA:  Chest pain.  EXAM: CHEST  2 VIEW  COMPARISON:  July 17, 2011.  FINDINGS: The heart size and mediastinal contours are within normal limits. Both lungs are clear. No pneumothorax or pleural effusion is noted. Stable severe compression deformity of mid thoracic vertebral body is noted. Atherosclerotic calcifications of thoracic aorta are noted.  IMPRESSION: No acute  cardiopulmonary abnormality seen.   Electronically Signed   By: Roque Lias M.D.   On: 10/03/2013 18:18     EKG Interpretation None      Date: 10/04/2013  Rate: 94  Rhythm: normal sinus rhythm  QRS Axis: normal  Intervals: normal  ST/T Wave abnormalities: normal  Conduction Disutrbances:left bundle branch block  Narrative Interpretation: LAE  Old EKG Reviewed: unchanged when compared to EKG from 07/17/2011 EKG analyzed and reviewed by this provider and attending physician.     MDM   Final diagnoses:  Chest pain  COPD (chronic obstructive pulmonary disease)   Medications  albuterol (PROVENTIL) (2.5 MG/3ML) 0.083% nebulizer solution 5 mg (5 mg Nebulization Given 10/03/13 2017)   Filed Vitals:   10/03/13 1700 10/03/13 1715 10/03/13 1730 10/03/13 2020  BP: 174/77 171/65 159/74   Pulse: 99 96 93   Temp:      Resp: 19 23 18    SpO2: 97% 97% 97% 98%    Patient presenting to the ED with chest pain that started yesterday. Patient reported that her episodes of chest pain yesterday was localized to the left side of the chest described as a sharp sensation without radiation. Patient reported that this morning she started to experience chest pain localized to the center of her chest described as a light dull aching sensation. Stated that she has been having cough that is productive of a white sputum, nasal congestion mild. Stated that she had a fever of 102F - stated that she took Tylenol with  relief. Reported that she has history of COPD and is currently on oxygen 2L/min at home at night.  Alert and oriented. GCS 15. Heart rate and rhythm normal. Lungs noted to have decreased breath sounds bilaterally to upper lower lobes. Wheezes expiratory to left upper lobe. Cap refill less than 3 seconds. Radial and DP pulses 2+ bilaterally. Full range of motion to upper and lower extremities bilaterally without difficulty or ataxia. Negative leg swelling negative pitting edema identified. EKG noted normal sinus rhythm with a left bundle branch block with a heart rate of 94 beats per minute - negative changes identified when compared to July 17, 2011 EKG. Troponin negative elevation. CBC negative findings-negative elevation white blood cell count. CMP negative findings Chest x-ray negative for acute cardiac pulmonary disease. HEART score 5. Well's Score for PE - negative. Patient presenting to the ED with chest pain. Patient to be admitted to the ED for chest pain. Discussed with Dr. Allena Katz - patient to be admitted to telemetry observation. Patient agreed to plan of care. Patient stable for transfer.   Raymon Mutton, PA-C 10/04/13 1245

## 2013-10-04 ENCOUNTER — Encounter (HOSPITAL_COMMUNITY): Payer: Self-pay | Admitting: Radiology

## 2013-10-04 ENCOUNTER — Observation Stay (HOSPITAL_COMMUNITY): Payer: Medicare Other

## 2013-10-04 DIAGNOSIS — I10 Essential (primary) hypertension: Secondary | ICD-10-CM

## 2013-10-04 DIAGNOSIS — J9610 Chronic respiratory failure, unspecified whether with hypoxia or hypercapnia: Secondary | ICD-10-CM

## 2013-10-04 DIAGNOSIS — R079 Chest pain, unspecified: Secondary | ICD-10-CM

## 2013-10-04 DIAGNOSIS — J449 Chronic obstructive pulmonary disease, unspecified: Secondary | ICD-10-CM

## 2013-10-04 DIAGNOSIS — E871 Hypo-osmolality and hyponatremia: Secondary | ICD-10-CM

## 2013-10-04 DIAGNOSIS — E785 Hyperlipidemia, unspecified: Secondary | ICD-10-CM

## 2013-10-04 DIAGNOSIS — I517 Cardiomegaly: Secondary | ICD-10-CM

## 2013-10-04 LAB — COMPREHENSIVE METABOLIC PANEL
ALT: 28 U/L (ref 0–35)
AST: 30 U/L (ref 0–37)
Albumin: 3.6 g/dL (ref 3.5–5.2)
Alkaline Phosphatase: 107 U/L (ref 39–117)
BUN: 14 mg/dL (ref 6–23)
CALCIUM: 9.5 mg/dL (ref 8.4–10.5)
CO2: 23 mEq/L (ref 19–32)
Chloride: 98 mEq/L (ref 96–112)
Creatinine, Ser: 1.17 mg/dL — ABNORMAL HIGH (ref 0.50–1.10)
GFR calc non Af Amer: 45 mL/min — ABNORMAL LOW (ref >90)
GFR, EST AFRICAN AMERICAN: 53 mL/min — AB (ref >90)
Glucose, Bld: 108 mg/dL — ABNORMAL HIGH (ref 70–99)
Potassium: 5.4 mEq/L — ABNORMAL HIGH (ref 3.7–5.3)
Sodium: 138 mEq/L (ref 137–147)
TOTAL PROTEIN: 6.8 g/dL (ref 6.0–8.3)
Total Bilirubin: <0.2 mg/dL — ABNORMAL LOW (ref 0.3–1.2)

## 2013-10-04 LAB — BASIC METABOLIC PANEL
BUN: 13 mg/dL (ref 6–23)
CALCIUM: 9.4 mg/dL (ref 8.4–10.5)
CHLORIDE: 99 meq/L (ref 96–112)
CO2: 22 mEq/L (ref 19–32)
CREATININE: 1.15 mg/dL — AB (ref 0.50–1.10)
GFR calc non Af Amer: 46 mL/min — ABNORMAL LOW (ref >90)
GFR, EST AFRICAN AMERICAN: 54 mL/min — AB (ref >90)
Glucose, Bld: 121 mg/dL — ABNORMAL HIGH (ref 70–99)
Potassium: 4.5 mEq/L (ref 3.7–5.3)
Sodium: 136 mEq/L — ABNORMAL LOW (ref 137–147)

## 2013-10-04 LAB — CBC WITH DIFFERENTIAL/PLATELET
Basophils Absolute: 0 10*3/uL (ref 0.0–0.1)
Basophils Relative: 1 % (ref 0–1)
EOS ABS: 0 10*3/uL (ref 0.0–0.7)
EOS PCT: 0 % (ref 0–5)
HEMATOCRIT: 38.1 % (ref 36.0–46.0)
Hemoglobin: 13 g/dL (ref 12.0–15.0)
LYMPHS ABS: 1.2 10*3/uL (ref 0.7–4.0)
LYMPHS PCT: 20 % (ref 12–46)
MCH: 31.3 pg (ref 26.0–34.0)
MCHC: 34.1 g/dL (ref 30.0–36.0)
MCV: 91.8 fL (ref 78.0–100.0)
MONO ABS: 0.8 10*3/uL (ref 0.1–1.0)
MONOS PCT: 13 % — AB (ref 3–12)
Neutro Abs: 3.9 10*3/uL (ref 1.7–7.7)
Neutrophils Relative %: 66 % (ref 43–77)
Platelets: 223 10*3/uL (ref 150–400)
RBC: 4.15 MIL/uL (ref 3.87–5.11)
RDW: 13.1 % (ref 11.5–15.5)
WBC: 6 10*3/uL (ref 4.0–10.5)

## 2013-10-04 LAB — PROTIME-INR
INR: 0.96 (ref 0.00–1.49)
Prothrombin Time: 12.6 seconds (ref 11.6–15.2)

## 2013-10-04 LAB — TROPONIN I
TROPONIN I: 0.32 ng/mL — AB (ref <0.30)
Troponin I: <0.3 ng/mL (ref <0.30)

## 2013-10-04 LAB — MAGNESIUM: MAGNESIUM: 2.3 mg/dL (ref 1.5–2.5)

## 2013-10-04 MED ORDER — LEVALBUTEROL HCL 1.25 MG/0.5ML IN NEBU
1.2500 mg | INHALATION_SOLUTION | RESPIRATORY_TRACT | Status: DC | PRN
  Filled 2013-10-04: qty 0.5

## 2013-10-04 MED ORDER — AMLODIPINE BESYLATE 5 MG PO TABS: 5.0000 mg | ORAL_TABLET | Freq: Every day | ORAL | Status: DC

## 2013-10-04 MED ORDER — DM-GUAIFENESIN ER 30-600 MG PO TB12
1.0000 | ORAL_TABLET | Freq: Two times a day (BID) | ORAL | Status: DC
  Administered 2013-10-04 – 2013-10-05 (×2): 1 via ORAL
  Filled 2013-10-04 (×3): qty 1

## 2013-10-04 MED ORDER — IOHEXOL 350 MG/ML SOLN
100.0000 mL | Freq: Once | INTRAVENOUS | Status: AC | PRN
  Administered 2013-10-04: 100 mL via INTRAVENOUS

## 2013-10-04 MED ORDER — LEVALBUTEROL HCL 1.25 MG/0.5ML IN NEBU
1.2500 mg | INHALATION_SOLUTION | Freq: Four times a day (QID) | RESPIRATORY_TRACT | Status: DC
  Filled 2013-10-04 (×2): qty 0.5

## 2013-10-04 MED ORDER — LEVALBUTEROL HCL 1.25 MG/0.5ML IN NEBU
1.2500 mg | INHALATION_SOLUTION | Freq: Four times a day (QID) | RESPIRATORY_TRACT | Status: DC
  Filled 2013-10-04 (×3): qty 0.5

## 2013-10-04 MED ORDER — LEVALBUTEROL HCL 1.25 MG/0.5ML IN NEBU
1.2500 mg | INHALATION_SOLUTION | Freq: Four times a day (QID) | RESPIRATORY_TRACT | Status: DC | PRN
  Filled 2013-10-04: qty 0.5

## 2013-10-04 MED ORDER — METOPROLOL SUCCINATE ER 50 MG PO TB24
50.0000 mg | ORAL_TABLET | Freq: Two times a day (BID) | ORAL | Status: DC
  Administered 2013-10-04 – 2013-10-05 (×2): 50 mg via ORAL
  Filled 2013-10-04 (×3): qty 1

## 2013-10-04 MED ORDER — HYDROCHLOROTHIAZIDE 12.5 MG PO CAPS
12.5000 mg | ORAL_CAPSULE | Freq: Every day | ORAL | Status: DC
  Administered 2013-10-04 – 2013-10-05 (×2): 12.5 mg via ORAL
  Filled 2013-10-04 (×2): qty 1

## 2013-10-04 NOTE — Progress Notes (Signed)
CRITICAL VALUE ALERT  Critical value received:  Troponin 0.32  Date of notification:  10/04/2013  Time of notification:  0830  Critical value read back:yes  Nurse who received alert:  Albina Billet   MD notified (1st page):  Dr. Joseph Art  Time of first page:  On floor  MD notified (2nd page):  Time of second page:  Responding MD:  Dr. Joseph Art  Time MD responded:  0830

## 2013-10-04 NOTE — Progress Notes (Signed)
UR completed. Patient changed to inpatient- + troponin requiring more tests

## 2013-10-04 NOTE — Progress Notes (Signed)
Patient with positive D-dimer result (16.06 ?g/mL). CT-angiogram of chest ordered by Dr. Allena Katz. Transported patient to and from radiology by wheelchair with no issues. No complaints of chest pain, pressure, SOB or other symptoms. Patient tolerated testing well.  Placed patient on O2 at 2L/m via nasal canula per nighttime home settings. HR remains mildly elevated at 106 on telemetry, much improved from rate on admission (130 - 145).  Continuing to monitor closely.

## 2013-10-04 NOTE — Progress Notes (Signed)
*  PRELIMINARY RESULTS* Echocardiogram 2D Echocardiogram has been performed.  Jeryl Columbia 10/04/2013, 11:43 AM

## 2013-10-04 NOTE — ED Provider Notes (Signed)
Medical screening examination/treatment/procedure(s) were conducted as a shared visit with non-physician practitioner(s) and myself.  I personally evaluated the patient during the encounter.   EKG Interpretation None        Darlys Gales, MD 10/04/13 1400

## 2013-10-04 NOTE — Consult Note (Signed)
CARDIOLOGY CONSULT NOTE   Patient ID: Melanie Williamson MRN: [id number] DOB/AGE: 1940/11/23 73 y.o.  Admit date: 10/03/2013  Primary Physician   Warrick Parisian, MD Primary Cardiologist   New Reason for Consultation   Chest pain and cough  HPI: Melanie Williamson is a 73 y.o. female with a history of COPD, chronic respiratory failure ( on 2 L of oxygen at night), HTN, HLD, family history of heart disease.  Shehas  no prior cardiac history. Last seen by T Wall in 2008 preop.  Echo showed LVEF was normal.   She presented to the [facility] yesterday complaining of a URI and chest tightness.    She had cough productive cough of whitish sputum, worsened SOB, sore throat x5 days and substernal chest tightness that started yesterday. The chest pain was rated 1/10, it lasted a few hours and eased off on its own. It is not worse with exertion or associated with SOB or diaphoresis. It does not radiate and is worse with inspiration. She denies palpitations, LE swelling, orthopnea, dyspnea or PND. No n/v or abdominal pain.  Currently denies CP  Breathing is at baseline  .    Past Medical History  Diagnosis Date  . Hypertension   . HTN (hypertension) 07/17/2011  . Hyperlipidemia 07/17/2011     History reviewed. No pertinent past surgical history.  No Known Allergies  I have reviewed the patient's current medications . amLODipine  5 mg Oral Daily  . amLODipine  5 mg Oral Daily  . aspirin EC  325 mg Oral Daily  . atorvastatin  10 mg Oral q1800  . dextromethorphan-guaiFENesin  1 tablet Oral BID  . enoxaparin (LOVENOX) injection  40 mg Subcutaneous QHS  . lisinopril  40 mg Oral Daily  . metoprolol succinate  25 mg Oral BID  . verapamil  240 mg Oral QHS     ALPRAZolam, gi cocktail, levalbuterol, traMADol  Prior to Admission medications   Medication Sig Start Date End Date Taking? Authorizing Provider  albuterol-ipratropium (COMBIVENT) 18-103 MCG/ACT inhaler Inhale 2 puffs into the lungs every 6  (six) hours as needed for wheezing or shortness of breath. 07/19/11 10/03/13 Yes Armen Pickup, MD  ALPRAZolam Prudy Feeler) 0.5 MG tablet Take 0.5 mg by mouth daily as needed for anxiety.  09/24/13  Yes Historical Provider, MD  amLODipine (NORVASC) 5 MG tablet Take 5 mg by mouth daily.     Yes Historical Provider, MD  ibuprofen (ADVIL,MOTRIN) 200 MG tablet Take 600 mg by mouth every 6 (six) hours as needed for moderate pain.   Yes Historical Provider, MD  lisinopril (PRINIVIL,ZESTRIL) 40 MG tablet Take 40 mg by mouth daily.     Yes Historical Provider, MD  metoprolol succinate (TOPROL-XL) 25 MG 24 hr tablet Take 25 mg by mouth 2 (two) times daily.    Yes Historical Provider, MD  pravastatin (PRAVACHOL) 40 MG tablet Take 40 mg by mouth daily.     Yes Historical Provider, MD  PROAIR HFA 108 (90 BASE) MCG/ACT inhaler Inhale 2 puffs into the lungs daily as needed for wheezing or shortness of breath.  09/11/13  Yes Historical Provider, MD  traMADol (ULTRAM) 50 MG tablet Take 50-100 mg by mouth every 6 (six) hours as needed. Maximum dose= 8 tablets per day. For pain    Yes Historical Provider, MD  verapamil (COVERA HS) 240 MG (CO) 24 hr tablet Take 240 mg by mouth at bedtime.     Yes Historical Provider, MD  History   Social History  . Marital Status: Divorced    Spouse Name: N/A    Number of Children: N/A  . Years of Education: N/A   Occupational History  . Not on file.   Social History Main Topics  . Smoking status: Former Games developer  . Smokeless tobacco: Never Used  . Alcohol Use: No  . Drug Use: No  . Sexual Activity: No   Other Topics Concern  . Not on file   Social History Narrative  . No narrative on file    No family status information on file.   History reviewed. No pertinent family history.   ROS:  Full 14 point review of systems complete and found to be negative unless listed above.  Physical Exam: Blood pressure 167/72, pulse 86, temperature 98.8 F (37.1 C),  temperature source Oral, resp. rate 20, height 5' (1.524 m), weight 155 lb 1.6 oz (70.353 kg), SpO2 96.00%.  General: Well developed, well nourished, female in no acute distress Head: Eyes PERRLA, No xanthomas.   Normocephalic and atraumatic, oropharynx without edema or exudate. Dentition:  Lungs: Some decreased airflow   Heart: HRRR S1 S2, no rub/gallop, Heart irregular rate and rhythm with S1, S2 No singif murmur. Neck: No carotid bruits. No lymphadenopathy.  No JVD. Abdomen: Bowel sounds present, abdomen soft and non-tender without masses or hernias noted. Msk:  No spine or cva tenderness. No weakness, no joint deformities or effusions. Extremities: No clubbing or cyanosis. No edema. Bruises on LEs bilaterally  and scabbed over leison on LLE.  2+ pulses  Neuro: Alert and oriented X 3. No focal deficits noted. Psych:  Good affect, responds appropriately  Labs:   Lab Results  Component Value Date   WBC 6.0 10/04/2013   HGB 13.0 10/04/2013   HCT 38.1 10/04/2013   MCV 91.8 10/04/2013   PLT 223 10/04/2013    Recent Labs  10/04/13 0350  INR 0.96    Recent Labs Lab 10/04/13 0350  NA 138  K 5.4*  CL 98  CO2 23  BUN 14  CREATININE 1.17*  CALCIUM 9.5  PROT 6.8  BILITOT <0.2*  ALKPHOS 107  ALT 28  AST 30  GLUCOSE 108*  ALBUMIN 3.6   No results found for this basename: mg    Recent Labs  10/03/13 1900 10/04/13 0350 10/04/13 0750 10/04/13 1400  TROPONINI <0.30 <0.30 0.32* <0.30   Echo: Study Date: 10/04/2013 ---------------------------------------------- LV EF: 40% - 45% ------------------------------------------------------------ Study Conclusions - Left ventricle: The cavity size was normal. Wall thickness was increased in a pattern of mild LVH. There was moderate focal basal hypertrophy of the septum. Systolic function was mildly to moderately reduced. The estimated ejection fraction was in the range of 40% to 45%. Although no diagnostic regional wall motion  abnormality was identified, this possibility cannot be completely excluded on the basis of this study. Doppler parameters are consistent with abnormal left ventricular relaxation (grade 1 diastolic dysfunction). - Ventricular septum: Septal motion showed dyssynergy consistent with bundle-branch block. - Aortic valve: Mildly to moderately calcified annulus. Trileaflet; normal thickness, mildly calcified leaflets. Left coronary cusp mobility was restricted. No significant regurgitation. - Mitral valve: Calcified annulus. Trivial regurgitation. - Right ventricle: The cavity size was normal. Wall thickness was increased. - Right atrium: Central venous pressure: 3mm Hg (est). - Tricuspid valve: Trivial regurgitation. - Pulmonary arteries: Systolic pressure could not be accurately estimated. - Pericardium, extracardiac: A prominent pericardial fat pad was present. Impressions: - Mild LVH with moderate  basal septal hypertrophy, LVEF approximately 40-45%, grade 1 diastolic dysfunction. There is abnormal septal motion consistent with bundle branch block. MAC with trivial mitral regurgitation. Moderately sclerotic aortic valve. Increased RV wall thickness with normal contraction. Unable to assess PASP. Prominent epicardial fat pad.  ECG:  Sinus with LBBB HR 94.  Radiology:  Dg Chest 2 View  10/03/2013   CLINICAL DATA:  Chest pain.  EXAM: CHEST  2 VIEW  COMPARISON:  July 17, 2011.  FINDINGS: The heart size and mediastinal contours are within normal limits. Both lungs are clear. No pneumothorax or pleural effusion is noted. Stable severe compression deformity of mid thoracic vertebral body is noted. Atherosclerotic calcifications of thoracic aorta are noted.  IMPRESSION: No acute cardiopulmonary abnormality seen.   Electronically Signed   By: Roque LiasJames  Green M.D.   On: 10/03/2013 18:18   Ct Angio Chest Pe W/cm &/or Wo Cm  10/04/2013   CLINICAL DATA:  Chest pain, elevated D-dimer level.  EXAM:  CT ANGIOGRAPHY CHEST WITH CONTRAST  TECHNIQUE: Multidetector CT imaging of the chest was performed using the standard protocol during bolus administration of intravenous contrast. Multiplanar CT image reconstructions and MIPs were obtained to evaluate the vascular anatomy.  CONTRAST:  100mL OMNIPAQUE IOHEXOL 350 MG/ML SOLN  COMPARISON:  None.  FINDINGS: No pleural effusion or pneumothorax is noted. No acute pulmonary disease is noted. Atherosclerotic calcifications of thoracic aorta are noted without aneurysm or dissection. There is no evidence of pulmonary embolus. Coronary artery calcifications are noted. Visualized portion of upper abdomen appears normal. No mediastinal mass or adenopathy is noted.  Review of the MIP images confirms the above findings.  IMPRESSION: No evidence of pulmonary embolus.  Coronary artery calcifications are noted suggesting coronary artery disease.   Electronically Signed   By: Roque LiasJames  Green M.D.   On: 10/04/2013 01:13    ASSESSMENT AND PLAN:    Principal Problem:   Chest pain Active Problems:   Hyponatremia   HTN (hypertension)   Hyperlipidemia   COPD (chronic obstructive pulmonary disease)   Chronic respiratory failure   Easy bruisability  Chest pain -the patient presented with complaints of chest pain and shortness of breath. Her initial EKG and troponins are negative. On troponin was minimally increased at 0.32.   She does have CAD with ocoronary calcifications on chest CT.  EKG with LBBB which is old. REcomm:  I am not convinced CP was cardiac in origin  Atypical  I would have patient ambulate and follow symptoms.  Would get very limited echo in AM to reeval LVEF  Inferior wall was not imaged well  I am not convinced LVEF is 40 to 45%  (I have spoken to echo tech already)  Will review in AM  D/C to home after Further testing can be done as outpatient.     - Cont ASA, BB, statin    -Hypertension - not well controlled. BP is better today but still not  optimal   I would add low dose HCTZ in AM 12.5 mg  And follow as outpatient  Elevated D dimer (15) in the setting of SOB. No recent surgeries or long distance travel  CT is negative   Not sure etiology.  Follow clinicially  Signed: Janeece AgeeSTERN, KATHRYN, PA-C 10/04/2013 3:29 PM  Patient seen and examined  Agree with findings of K Venetia MaxonStern above.  I have amended to reflect my findings.    WIll see in AM  Dietrich PatesPaula Adeliz Tonkinson

## 2013-10-04 NOTE — Progress Notes (Signed)
*  PRELIMINARY RESULTS* Echocardiogram 2D Echocardiogram has been performed.  Jeryl Columbia 10/04/2013, 11:51 AM

## 2013-10-04 NOTE — Progress Notes (Signed)
[facility] PROGRESS NOTE  Melanie Lavora B Welke WUJ:[id number] DOB: 10/16/1940 DOA: 10/03/2013 PCP: Warrick ParisianSTALLINGS,SHEILA, MD  Assessment/Plan:  Chest pain -Abnormal EKG showing NSR, LBBB -Obtain echocardiogram which is abnormal see below results -Patient has 1/3 positive troponin -Spoken with Dr. Myrtis SerKatz (cardiology), cardiac catheterization vs medical management?  HTN -Not within AHA guidelines -Increase amlodipine 10mg  -Increased metoprolol to 50 mg daily -Continue lisinopril 40 mg daily -Continue verapamil CR 240 mg daily -Continue hydrochlorothiazide 12.5 mg daily  HLD -Obtain lipid panel  COPD -Xopenex nebulizer -Patient not on ACCP guidelines COPD medically on discharge will need referral to pulmonologist for spirometry pre-/post bronchodilator and DLCO  Chronic respiratory failure -Continue patient on oxygen via [place] to maintain SpO2> 92%     Status: Full Family Communication: none Disposition Plan: Per Cardiology    Consultants: Dr. Myrtis SerKatz (cardiology)   Procedures: Echocardiogram 10/04/2013 - Left ventricle: The cavity size was normal. mild LVH.  -moderate focal basal hypertrophy of the septum.  -Systolic function mildly to moderately reduced.  -LVEF= 40% to 45%.  -(grade 1 diastolic dysfunction). - Ventricular septum: Septal motion showed dyssynergy consistent with bundle-branch block. - Aortic valve: Mildly to moderately calcified annulus. Trileaflet; normal thickness, mildly calcified leaflets. -Left coronary cusp mobility was restricted. No significant regurgitation. - Mitral valve: Calcified annulus. Trivial regurgitation. - Right ventricle: The cavity size was normal. Wall thickness was increased. - Right atrium: Central venous pressure: 3mm Hg (est). - Tricuspid valve: Trivial regurgitation. - Pulmonary arteries: Systolic pressure could not be accurately estimated.   CXR 10/03/2013 The heart size and mediastinal contours are within normal limits.  Both  lungs are clear. No pneumothorax or pleural effusion is noted.  Stable severe compression deformity of mid thoracic vertebral body  is noted. Atherosclerotic calcifications of thoracic aorta are  noted.   CT angiogram PE protocol 3-13/2015 No evidence of pulmonary embolus.  Coronary artery calcifications are noted suggesting coronary artery  disease.     Antibiotics:    HPI/Subjective: Melanie Williamson is a 73 y.o. WF PMHx hypertension, dyslipidemia, COPD, chronic respiratory failure on 2 L of oxygen at night.  The patient is coming from home.  Patient presented with complaints of cough and chest pain associated with shortness of breath.  She mentions that since last one week she has been having complaints of sore throat and cough with whitish expectoration, with mild worsening of her breathing on exertion. Today she had an episode of chest tightness located substernally nonradiating, this felt like to taking pain worsened with breathing as well.  She denies any fever or chills denies any nausea or vomiting denies any acid reflux.  She mentions she has similar chest tightness all the time and she has upper respiratory infection.  She mentions she bruises easily.  She denies any orthopnea PND or leg swelling, denies any recent travel or immobilization or surgery. Denies any change in her medication. Mentions he is compliant with her medications.  Today she went to see her PCP for complaint of upper respiratory infection and since she had complaints of chest pain she was brought to the ED.   Objective: Filed Vitals:   10/03/13 2020 10/03/13 2118 10/04/13 0522 10/04/13 1442  BP:  127/85 147/69 167/72  Pulse:  137 87 86  Temp:  98.2 F (36.8 C) 98.8 F (37.1 C) 98.8 F (37.1 C)  TempSrc:  Oral Oral Oral  Resp:  22  20  Height:  5' (1.524 m)    Weight:  70.353 kg (155  lb 1.6 oz) 70.353 kg (155 lb 1.6 oz)   SpO2: 98% 100% 99% 96%    Intake/Output Summary (Last 24 hours) at 10/04/13  1506 Last data filed at 10/03/13 2100  Gross per 24 hour  Intake    255 ml  Output      0 ml  Net    255 ml   Filed Weights   10/03/13 2118 10/04/13 0522  Weight: 70.353 kg (155 lb 1.6 oz) 70.353 kg (155 lb 1.6 oz)    Exam:   General:  A./O. x4, NAD  Cardiovascular: Regular rhythm and rate, negative murmurs rubs or gallops, DP/PT pulse one plus bilateral  Respiratory: Poor air movement all lung fields  Abdomen: Soft, nontender, nondistended, plus bowel sound  Musculoskeletal: Negative pedal   Data Reviewed: Basic Metabolic Panel:  Recent Labs Lab 10/03/13 1900 10/04/13 0350  NA 136* 138  K 4.5 5.4*  CL 97 98  CO2 20 23  GLUCOSE 108* 108*  BUN 14 14  CREATININE 1.15* 1.17*  CALCIUM 9.1 9.5   Liver Function Tests:  Recent Labs Lab 10/03/13 1900 10/04/13 0350  AST 34 30  ALT 30 28  ALKPHOS 108 107  BILITOT <0.2* <0.2*  PROT 7.0 6.8  ALBUMIN 3.7 3.6   No results found for this basename: LIPASE, AMYLASE,  in the last 168 hours No results found for this basename: AMMONIA,  in the last 168 hours CBC:  Recent Labs Lab 10/03/13 1900 10/04/13 0350  WBC 5.0 6.0  NEUTROABS 3.1 3.9  HGB 13.2 13.0  HCT 37.9 38.1  MCV 90.7 91.8  PLT 229 223   Cardiac Enzymes:  Recent Labs Lab 10/03/13 1900 10/04/13 0350 10/04/13 0750  TROPONINI <0.30 <0.30 0.32*   BNP (last 3 results) No results found for this basename: PROBNP,  in the last 8760 hours CBG: No results found for this basename: GLUCAP,  in the last 168 hours  No results found for this or any previous visit (from the past 240 hour(s)).   Studies: Dg Chest 2 View  10/03/2013   CLINICAL DATA:  Chest pain.  EXAM: CHEST  2 VIEW  COMPARISON:  July 17, 2011.  FINDINGS: The heart size and mediastinal contours are within normal limits. Both lungs are clear. No pneumothorax or pleural effusion is noted. Stable severe compression deformity of mid thoracic vertebral body is noted. Atherosclerotic  calcifications of thoracic aorta are noted.  IMPRESSION: No acute cardiopulmonary abnormality seen.   Electronically Signed   By: Roque Lias M.D.   On: 10/03/2013 18:18   Ct Angio Chest Pe W/cm &/or Wo Cm  10/04/2013   CLINICAL DATA:  Chest pain, elevated D-dimer level.  EXAM: CT ANGIOGRAPHY CHEST WITH CONTRAST  TECHNIQUE: Multidetector CT imaging of the chest was performed using the standard protocol during bolus administration of intravenous contrast. Multiplanar CT image reconstructions and MIPs were obtained to evaluate the vascular anatomy.  CONTRAST:  OMNIPAQUE IOHEXOL 350 MG/ML SOLN  COMPARISON:  None.  FINDINGS: No pleural effusion or pneumothorax is noted. No acute pulmonary disease is noted. Atherosclerotic calcifications of thoracic aorta are noted without aneurysm or dissection. There is no evidence of pulmonary embolus. Coronary artery calcifications are noted. Visualized portion of upper abdomen appears normal. No mediastinal mass or adenopathy is noted.  Review of the MIP images confirms the above findings.  IMPRESSION: No evidence of pulmonary embolus.  Coronary artery calcifications are noted suggesting coronary artery disease.   Electronically Signed   By:  Roque Lias M.D.   On: 10/04/2013 01:13    Scheduled Meds: . amLODipine  5 mg Oral Daily  . aspirin EC  325 mg Oral Daily  . atorvastatin  10 mg Oral q1800  . dextromethorphan-guaiFENesin  1 tablet Oral BID  . enoxaparin (LOVENOX) injection  40 mg Subcutaneous QHS  . levalbuterol  1.25 mg Nebulization 4 times per day  . lisinopril  40 mg Oral Daily  . metoprolol succinate  25 mg Oral BID  . verapamil  240 mg Oral QHS   Continuous Infusions:   Principal Problem:   Chest pain Active Problems:   Hyponatremia   HTN (hypertension)   Hyperlipidemia   COPD (chronic obstructive pulmonary disease)   Chronic respiratory failure   Easy bruisability    Time spent: 45 minutes    Kole Hilyard, J  [facility] Pager [phone]. If 7PM-7AM, please contact night-coverage at [email or web address], password [facility] 10/04/2013, 3:06 PM  LOS: 1 day

## 2013-10-05 DIAGNOSIS — I2510 Atherosclerotic heart disease of native coronary artery without angina pectoris: Secondary | ICD-10-CM

## 2013-10-05 DIAGNOSIS — R072 Precordial pain: Secondary | ICD-10-CM

## 2013-10-05 DIAGNOSIS — I447 Left bundle-branch block, unspecified: Secondary | ICD-10-CM

## 2013-10-05 LAB — COMPREHENSIVE METABOLIC PANEL
ALT: 23 U/L (ref 0–35)
AST: 27 U/L (ref 0–37)
Albumin: 3.4 g/dL — ABNORMAL LOW (ref 3.5–5.2)
Alkaline Phosphatase: 93 U/L (ref 39–117)
BUN: 22 mg/dL (ref 6–23)
CO2: 20 mEq/L (ref 19–32)
Calcium: 9 mg/dL (ref 8.4–10.5)
Chloride: 99 mEq/L (ref 96–112)
Creatinine, Ser: 1.46 mg/dL — ABNORMAL HIGH (ref 0.50–1.10)
GFR calc Af Amer: 40 mL/min — ABNORMAL LOW (ref >90)
GFR calc non Af Amer: 35 mL/min — ABNORMAL LOW (ref >90)
Glucose, Bld: 94 mg/dL (ref 70–99)
Potassium: 4.4 mEq/L (ref 3.7–5.3)
SODIUM: 136 meq/L — AB (ref 137–147)
TOTAL PROTEIN: 6.6 g/dL (ref 6.0–8.3)
Total Bilirubin: <0.2 mg/dL — ABNORMAL LOW (ref 0.3–1.2)

## 2013-10-05 LAB — LIPID PANEL
CHOL/HDL RATIO: 2.3 ratio
Cholesterol: 140 mg/dL (ref 0–200)
HDL: 62 mg/dL (ref >39)
LDL Cholesterol: 65 mg/dL (ref 0–99)
Triglycerides: 66 mg/dL (ref <150)
VLDL: 13 mg/dL (ref 0–40)

## 2013-10-05 LAB — INFLUENZA PANEL BY PCR (TYPE A & B)
H1N1 flu by pcr: NOT DETECTED
Influenza A By PCR: NEGATIVE
Influenza B By PCR: NEGATIVE

## 2013-10-05 LAB — MAGNESIUM: MAGNESIUM: 2.1 mg/dL (ref 1.5–2.5)

## 2013-10-05 MED ORDER — AMLODIPINE BESYLATE 5 MG PO TABS: 10.0000 mg | ORAL_TABLET | Freq: Every day | ORAL | Status: DC

## 2013-10-05 MED ORDER — METOPROLOL SUCCINATE ER 50 MG PO TB24
50.0000 mg | ORAL_TABLET | Freq: Two times a day (BID) | ORAL | Status: DC
Start: 2013-10-05 — End: 2014-02-28

## 2013-10-05 MED ORDER — AMLODIPINE BESYLATE 5 MG PO TABS
5.0000 mg | ORAL_TABLET | Freq: Every day | ORAL | Status: AC
Start: 2013-10-05 — End: ?

## 2013-10-05 MED ORDER — METOPROLOL SUCCINATE ER 50 MG PO TB24
50.0000 mg | ORAL_TABLET | Freq: Two times a day (BID) | ORAL | Status: DC
Start: 2013-10-05 — End: 2013-10-05

## 2013-10-05 MED ORDER — HYDROCHLOROTHIAZIDE 12.5 MG PO CAPS: 12.5000 mg | ORAL_CAPSULE | Freq: Every day | ORAL | Status: DC

## 2013-10-05 NOTE — Discharge Instructions (Signed)
Cardiac Diet °This diet can help prevent heart disease and stroke. Many factors influence your heart health, including eating and exercise habits. Coronary risk rises a lot with abnormal blood fat (lipid) levels. Cardiac meal planning includes limiting unhealthy fats, increasing healthy fats, and making other small dietary changes. General guidelines are as follows: °· Adjust calorie intake to reach and maintain desirable body weight. °· Limit total fat intake to less than 30% of total calories. Saturated fat should be less than 7% of calories. °· Saturated fats are found in animal products and in some vegetable products. Saturated vegetable fats are found in coconut oil, cocoa butter, palm oil, and palm kernel oil. Read labels carefully to avoid these products as much as possible. Use butter in moderation. Choose tub margarines and oils that have 2 grams of fat or less. Good cooking oils are canola and olive oils. °· Practice low-fat cooking techniques. Do not fry food. Instead, broil, bake, boil, steam, grill, roast on a rack, stir-fry, or microwave it. Other fat reducing suggestions include: °· Remove the skin from poultry. °· Remove all visible fat from meats. °· Skim the fat off stews, soups, and gravies before serving them. °· Steam vegetables in water or broth instead of sautéing them in fat. °· Avoid foods with trans fat (or hydrogenated oils), such as commercially fried foods and commercially baked goods. Commercial shortening and deep-frying fats will contain trans fat. °· Increase intake of fruits, vegetables, whole grains, and legumes to replace foods high in fat. °· Increase consumption of nuts, legumes, and seeds to at least 4 servings weekly. One serving of a legume equals ½ cup, and 1 serving of nuts or seeds equals ¼ cup. °· Choose whole grains more often. Have 3 servings per day (a serving is 1 ounce [oz]). °· Eat 4 to 5 servings of vegetables per day. A serving of vegetables is 1 cup of raw leafy  vegetables; ½ cup of raw or cooked cut-up vegetables; ½ cup of vegetable juice. °· Eat 4 to 5 servings of fruit per day. A serving of fruit is 1 medium whole fruit; ¼ cup of dried fruit; ½ cup of fresh, frozen, or canned fruit; ½ cup of 100% fruit juice. °· Increase your intake of dietary fiber to 20 to 30 grams per day. Insoluble fiber may help lower your risk of heart disease and may help curb your appetite.  °Soluble fiber binds cholesterol to be removed from the blood. Foods high in soluble fiber are dried beans, citrus fruits, oats, apples, bananas, broccoli, Brussels sprouts, and eggplant. °· Try to include foods fortified with plant sterols or stanols, such as yogurt, breads, juices, or margarines. Choose several fortified foods to achieve a daily intake of 2 to 3 grams of plant sterols or stanols. °· Foods with omega-3 fats can help reduce your risk of heart disease. Aim to have a 3.5 oz portion of fatty fish twice per week, such as salmon, mackerel, albacore tuna, sardines, lake trout, or herring. If you wish to take a fish oil supplement, choose one that contains 1 gram of both DHA and EPA. °· Limit processed meats to 2 servings (3 oz portion) weekly. °· Limit the sodium in your diet to 1500 milligrams (mg) per day. If you have high blood pressure, talk to a registered dietitian about a DASH (Dietary Approaches to Stop Hypertension) eating plan. °· Limit sweets and beverages with added sugar, such as soda, to no more than 5 servings per week. One   serving is:   °· 1 tablespoon sugar. °· 1 tablespoon jelly or jam. °· ½ cup sorbet. °· 1 cup lemonade. °· ½ cup regular soda. °CHOOSING FOODS °Starches °· Allowed: Breads: All kinds (wheat, rye, raisin, white, oatmeal, Italian, French, and English muffin bread). Low-fat rolls: English muffins, frankfurter and hamburger buns, bagels, pita bread, tortillas (not fried). Pancakes, waffles, biscuits, and muffins made with recommended oil. °· Avoid: Products made with  saturated or trans fats, oils, or whole milk products. Butter rolls, cheese breads, croissants. Commercial doughnuts, muffins, sweet rolls, biscuits, waffles, pancakes, store-bought mixes. °Crackers °· Allowed: Low-fat crackers and snacks: Animal, graham, rye, saltine (with recommended oil, no lard), oyster, and matzo crackers. Bread sticks, melba toast, rusks, flatbread, pretzels, and light popcorn. °· Avoid: High-fat crackers: cheese crackers, butter crackers, and those made with coconut, palm oil, or trans fat (hydrogenated oils). Buttered popcorn. °Cereals °· Allowed: Hot or cold whole-grain cereals. °· Avoid: Cereals containing coconut, hydrogenated vegetable fat, or animal fat. °Potatoes / Pasta / Rice °· Allowed: All kinds of potatoes, rice, and pasta (such as macaroni, spaghetti, and noodles). °· Avoid: Pasta or rice prepared with cream sauce or high-fat cheese. Chow mein noodles, French fries. °Vegetables °· Allowed: All vegetables and vegetable juices. °· Avoid: Fried vegetables. Vegetables in cream, butter, or high-fat cheese sauces. Limit coconut. Fruit in cream or custard. °Protein °· Allowed: Limit your intake of meat, seafood, and poultry to no more than 6 oz (cooked weight) per day. All lean, well-trimmed beef, veal, pork, and lamb. All chicken and turkey without skin. All fish and shellfish. Wild game: wild duck, rabbit, pheasant, and venison. Egg whites or low-cholesterol egg substitutes may be used as desired. Meatless dishes: recipes with dried beans, peas, lentils, and tofu (soybean curd). Seeds and nuts: all seeds and most nuts. °· Avoid: Prime grade and other heavily marbled and fatty meats, such as short ribs, spare ribs, rib eye roast or steak, frankfurters, sausage, bacon, and high-fat luncheon meats, mutton. Caviar. Commercially fried fish. Domestic duck, goose, venison sausage. Organ meats: liver, gizzard, heart, chitterlings, brains, kidney, sweetbreads. °Dairy °· Allowed: Low-fat  cheeses: nonfat or low-fat cottage cheese (1% or 2% fat), cheeses made with part skim milk, such as mozzarella, farmers, string, or ricotta. (Cheeses should be labeled no more than 2 to 6 grams fat per oz.). Skim (or 1%) milk: liquid, powdered, or evaporated. Buttermilk made with low-fat milk. Drinks made with skim or low-fat milk or cocoa. Chocolate milk or cocoa made with skim or low-fat (1%) milk. Nonfat or low-fat yogurt. °· Avoid: Whole milk cheeses, including colby, cheddar, muenster, Monterey Jack, Havarti, Brie, Camembert, American, Swiss, and blue. Creamed cottage cheese, cream cheese. Whole milk and whole milk products, including buttermilk or yogurt made from whole milk, drinks made from whole milk. Condensed milk, evaporated whole milk, and 2% milk. °Soups and Combination Foods °· Allowed: Low-fat low-sodium soups: broth, dehydrated soups, homemade broth, soups with the fat removed, homemade cream soups made with skim or low-fat milk. Low-fat spaghetti, lasagna, chili, and Spanish rice if low-fat ingredients and low-fat cooking techniques are used. °· Avoid: Cream soups made with whole milk, cream, or high-fat cheese. All other soups. °Desserts and Sweets °· Allowed: Sherbet, fruit ices, gelatins, meringues, and angel food cake. Homemade desserts with recommended fats, oils, and milk products. Jam, jelly, honey, marmalade, sugars, and syrups. Pure sugar candy, such as gum drops, hard candy, jelly beans, marshmallows, mints, and small amounts of dark chocolate. °· Avoid: Commercially prepared   cakes, pies, cookies, frosting, pudding, or mixes for these products. Desserts containing whole milk products, chocolate, coconut, lard, palm oil, or palm kernel oil. Ice cream or ice cream drinks. Candy that contains chocolate, coconut, butter, hydrogenated fat, or unknown ingredients. Buttered syrups. °Fats and Oils °· Allowed: Vegetable oils: safflower, sunflower, corn, soybean, cottonseed, sesame, canola, olive,  or peanut. Non-hydrogenated margarines. Salad dressing or mayonnaise: homemade or commercial, made with a recommended oil. Low or nonfat salad dressing or mayonnaise. °· Limit added fats and oils to 6 to 8 tsp per day (includes fats used in cooking, baking, salads, and spreads on bread). Remember to count the "hidden fats" in foods. °· Avoid: Solid fats and shortenings: butter, lard, salt pork, bacon drippings. Gravy containing meat fat, shortening, or suet. Cocoa butter, coconut. Coconut oil, palm oil, palm kernel oil, or hydrogenated oils: these ingredients are often used in bakery products, nondairy creamers, whipped toppings, candy, and commercially fried foods. Read labels carefully. Salad dressings made of unknown oils, sour cream, or cheese, such as blue cheese and Roquefort. Cream, all kinds: half-and-half, light, heavy, or whipping. Sour cream or cream cheese (even if "light" or low-fat). Nondairy cream substitutes: coffee creamers and sour cream substitutes made with palm, palm kernel, hydrogenated oils, or coconut oil. °Beverages °· Allowed: Coffee (regular or decaffeinated), tea. Diet carbonated beverages, mineral water. Alcohol: Check with your caregiver. Moderation is recommended. °· Avoid: Whole milk, regular sodas, and juice drinks with added sugar. °Condiments °· Allowed: All seasonings and condiments. Cocoa powder. "Cream" sauces made with recommended ingredients. °· Avoid: Carob powder made with hydrogenated fats. °SAMPLE MENU °Breakfast °· ½ cup orange juice °· ½ cup oatmeal °· 1 slice toast °· 1 tsp margarine °· 1 cup skim milk °Lunch °· Turkey sandwich with 2 oz turkey, 2 slices bread °· Lettuce and tomato slices °· Fresh fruit °· Carrot sticks °· Coffee or tea °Snack °· Fresh fruit or low-fat crackers °Dinner °· 3 oz lean ground beef °· 1 baked potato °· 1 tsp margarine °· ½ cup asparagus °· Lettuce salad °· 1 tbs non-creamy dressing °· ½ cup peach slices °· 1 cup skim milk °Document Released:  04/19/2008 Document Revised: 01/10/2012 Document Reviewed: 10/04/2011 °[employer]® Patient Information ©2014 [employer]. ° °

## 2013-10-05 NOTE — Progress Notes (Signed)
  Echocardiogram 2D Echocardiogram has been performed.  Georgian Co 10/05/2013, 9:03 AM

## 2013-10-05 NOTE — Discharge Summary (Signed)
Physician Discharge Summary  Melanie Lavora B Soltys ZOX:[id number] DOB: 12/17/1940 DOA: 10/03/2013  PCP: Warrick ParisianSTALLINGS,SHEILA, MD  Admit date: 10/03/2013 Discharge date: 10/05/2013  Time spent: 35 minutes  Recommendations for Outpatient Follow-up:   Chest pain  -Abnormal EKG showing NSR, LBBB  -Obtain echocardiogram which is abnormal see below results  -Patient has 1/3 positive troponin  -Spoken with Dr. Myrtis SerKatz (cardiology), cardiac catheterization vs medical management?  -Patient evaluated by Dr. Dietrich PatesPaula Ross (cardiology). Patient will be seen as outpatient for nuclear stress test. Safe for discharge -Patient to start aspirin 81 mg daily  HTN  -Still slightly outside of AHA guidelines however significantly improved. Considering patient's age would not attend to lower further at this time.   -Discharge on Amlodipine 5mg   -Discharge on Metoprolol to 50 mg daily  -Discharge on Lisinopril 40 mg daily  -Discharge on Verapamil CR 240 mg daily  -Discharge on Hydrochlorothiazide 12.5 mg daily   HLD  -Lipid panel , within NCEP guidelines no new medication required  COPD  -Xopenex nebulizer  -Patient not on ACCP guidelines COPD medically on discharge will need referral to pulmonologist for spirometry pre-/post bronchodilator and DLCO   Chronic respiratory failure  - discharge patient on home oxygen via [place] to maintain SpO2> 92%     Discharge Diagnoses:  Principal Problem:   Chest pain Active Problems:   Hyponatremia   HTN (hypertension)   Hyperlipidemia   COPD (chronic obstructive pulmonary disease)   Chronic respiratory failure   Easy bruisability   Discharge Condition: Stable  Diet recommendation: Heart healthy  Filed Weights   10/03/13 2118 10/04/13 0522  Weight: 70.353 kg (155 lb 1.6 oz) 70.353 kg (155 lb 1.6 oz)    History of present illness:  Melanie Williamson is a 73 y.o. WF PMHx hypertension, dyslipidemia, COPD, chronic respiratory failure on 2 L of oxygen at night.  The patient is  coming from home.  Patient presented with complaints of cough and chest pain associated with shortness of breath.  She mentions that since last one week she has been having complaints of sore throat and cough with whitish expectoration, with mild worsening of her breathing on exertion. Today she had an episode of chest tightness located substernally nonradiating, this felt like to taking pain worsened with breathing as well.  She denies any fever or chills denies any nausea or vomiting denies any acid reflux.  She mentions she has similar chest tightness all the time and she has upper respiratory infection.  She mentions she bruises easily.  She denies any orthopnea PND or leg swelling, denies any recent travel or immobilization or surgery. Denies any change in her medication. Mentions he is compliant with her medications.  Today she went to see her PCP for complaint of upper respiratory infection and since she had complaints of chest pain she was brought to the ED. 3/14 patient is pain-free, negative SOB, negative N./V. patient not on an appropriate medication regimen for COPD, will refer to [facility] pulmonology for workup upon discharge. Patient was seen this a.m. by Dr. Dietrich PatesPaula Ross (cardiology) who has clear patient for discharge with outpatient followup    Consultants:  Dr. Myrtis SerKatz (cardiology)  Dr. Dietrich PatesPaula Ross (cardiology)   Procedures:  Echocardiogram 10/04/2013  - Left ventricle: The cavity size was normal. mild LVH.  -moderate focal basal hypertrophy of the septum.  -Systolic function mildly to moderately reduced.  -LVEF= 40% to 45%.  -(grade 1 diastolic dysfunction). - Ventricular septum: Septal motion showed dyssynergy consistent with bundle-branch  block. - Aortic valve: Mildly to moderately calcified annulus. Trileaflet; normal thickness, mildly calcified leaflets. -Left coronary cusp mobility was restricted. No significant regurgitation. - Mitral valve: Calcified annulus. Trivial  regurgitation. - Right ventricle: The cavity size was normal. Wall thickness was increased. - Right atrium: Central venous pressure: 3mm Hg (est). - Tricuspid valve: Trivial regurgitation. - Pulmonary arteries: Systolic pressure could not be accurately estimated.  CXR 10/03/2013  The heart size and mediastinal contours are within normal limits.  Both lungs are clear. No pneumothorax or pleural effusion is noted.  Stable severe compression deformity of mid thoracic vertebral body  is noted. Atherosclerotic calcifications of thoracic aorta are  noted.   CT angiogram PE protocol 3-13/2015  No evidence of pulmonary embolus.  Coronary artery calcifications are noted suggesting coronary artery  disease.     Antibiotics:    Discharge Exam: Filed Vitals:   10/05/13 0606 10/05/13 0716 10/05/13 1004 10/05/13 1007  BP: 88/53 129/54 130/60   Pulse: 74 72  74  Temp: 98.7 F (37.1 C)     TempSrc: Oral     Resp: 20     Height:      Weight:      SpO2: 94%      General: A./O. x4, NAD  Cardiovascular: Regular rhythm and rate, negative murmurs rubs or gallops, DP/PT pulse one plus bilateral  Respiratory: Clear to auscultation bilateral  Abdomen: Soft, nontender, nondistended, plus bowel sound  Musculoskeletal: Negative pedal   Discharge Instructions     Medication List    STOP taking these medications       albuterol-ipratropium 18-103 MCG/ACT inhaler  Commonly known as:  COMBIVENT      TAKE these medications       ALPRAZolam 0.5 MG tablet  Commonly known as:  XANAX  Take 0.5 mg by mouth daily as needed for anxiety.     amLODipine 5 MG tablet  Commonly known as:  NORVASC  Take 1 tablet (5 mg total) by mouth daily.     hydrochlorothiazide 12.5 MG capsule  Commonly known as:  MICROZIDE  Take 1 capsule (12.5 mg total) by mouth daily.     ibuprofen 200 MG tablet  Commonly known as:  ADVIL,MOTRIN  Take 600 mg by mouth every 6 (six) hours as needed for moderate pain.      lisinopril 40 MG tablet  Commonly known as:  PRINIVIL,ZESTRIL  Take 40 mg by mouth daily.     metoprolol succinate 50 MG 24 hr tablet  Commonly known as:  TOPROL-XL  Take 1 tablet (50 mg total) by mouth 2 (two) times daily. Take with or immediately following a meal.     pravastatin 40 MG tablet  Commonly known as:  PRAVACHOL  Take 40 mg by mouth daily.     PROAIR HFA 108 (90 BASE) MCG/ACT inhaler  Generic drug:  albuterol  Inhale 2 puffs into the lungs daily as needed for wheezing or shortness of breath.     traMADol 50 MG tablet  Commonly known as:  ULTRAM  Take 50-100 mg by mouth every 6 (six) hours as needed. Maximum dose= 8 tablets per day. For pain     verapamil 240 MG (CO) 24 hr tablet  Commonly known as:  COVERA HS  Take 240 mg by mouth at bedtime.       No Known Allergies Follow-up Information   Follow up with STALLINGS,SHEILA, MD. Schedule an appointment as soon as possible for a visit in 1 week. (  Hospital followup)    Specialty:  Family Medicine   Contact information:   [address] [place] [address] [phone]       Follow up with Dietrich Pates, MD. Schedule an appointment as soon as possible for a visit in 1 week. (Schedule outpatient stress test)    Specialty:  Cardiology   Contact information:   [address] [place] [address] [phone]       Follow up with [facility]. (Patient with COPD requiring spirometry pre-/post bronchodilator and DLCO. Evaluate and treat)    Specialty:  Pulmonology   Contact information:   [address] [place] [address] [phone]       The results of significant diagnostics from this hospitalization (including imaging, microbiology, ancillary and laboratory) are listed below for reference.    Significant Diagnostic Studies: Dg Chest 2 View  10/03/2013   CLINICAL DATA:  Chest pain.  EXAM: CHEST  2 VIEW  COMPARISON:  July 17, 2011.  FINDINGS: The heart size and  mediastinal contours are within normal limits. Both lungs are clear. No pneumothorax or pleural effusion is noted. Stable severe compression deformity of mid thoracic vertebral body is noted. Atherosclerotic calcifications of thoracic aorta are noted.  IMPRESSION: No acute cardiopulmonary abnormality seen.   Electronically Signed   By: Roque Lias M.D.   On: 10/03/2013 18:18   Ct Angio Chest Pe W/cm &/or Wo Cm  10/04/2013   CLINICAL DATA:  Chest pain, elevated D-dimer level.  EXAM: CT ANGIOGRAPHY CHEST WITH CONTRAST  TECHNIQUE: Multidetector CT imaging of the chest was performed using the standard protocol during bolus administration of intravenous contrast. Multiplanar CT image reconstructions and MIPs were obtained to evaluate the vascular anatomy.  CONTRAST:  OMNIPAQUE IOHEXOL 350 MG/ML SOLN  COMPARISON:  None.  FINDINGS: No pleural effusion or pneumothorax is noted. No acute pulmonary disease is noted. Atherosclerotic calcifications of thoracic aorta are noted without aneurysm or dissection. There is no evidence of pulmonary embolus. Coronary artery calcifications are noted. Visualized portion of upper abdomen appears normal. No mediastinal mass or adenopathy is noted.  Review of the MIP images confirms the above findings.  IMPRESSION: No evidence of pulmonary embolus.  Coronary artery calcifications are noted suggesting coronary artery disease.   Electronically Signed   By: Roque Lias M.D.   On: 10/04/2013 01:13    Microbiology: No results found for this or any previous visit (from the past 240 hour(s)).   Labs: Basic Metabolic Panel:  Recent Labs Lab 10/03/13 1900 10/04/13 0350 10/04/13 1700 10/05/13 0657  NA 136* 138 136* 136*  K 4.5 5.4* 4.5 4.4  CL 97 98 99 99  CO2 20 23 22 20   GLUCOSE 108* 108* 121* 94  BUN 14 14 13 22   CREATININE 1.15* 1.17* 1.15* 1.46*  CALCIUM 9.1 9.5 9.4 9.0  MG  --   --  2.3 2.1   Liver Function Tests:  Recent Labs Lab 10/03/13 1900  10/04/13 0350 10/05/13 0657  AST 34 30 27  ALT 30 28 23   ALKPHOS 108 107 93  BILITOT <0.2* <0.2* <0.2*  PROT 7.0 6.8 6.6  ALBUMIN 3.7 3.6 3.4*   No results found for this basename: LIPASE, AMYLASE,  in the last 168 hours No results found for this basename: AMMONIA,  in the last 168 hours CBC:  Recent Labs Lab 10/03/13 1900 10/04/13 0350  WBC 5.0 6.0  NEUTROABS 3.1 3.9  HGB 13.2 13.0  HCT 37.9 38.1  MCV 90.7 91.8  PLT 229 223   Cardiac Enzymes:  Recent Labs Lab 10/03/13 1900 10/04/13 0350 10/04/13 0750 10/04/13 1400  TROPONINI <0.30 <0.30 0.32* <0.30   BNP: BNP (last 3 results) No results found for this basename: PROBNP,  in the last 8760 hours CBG: No results found for this basename: GLUCAP,  in the last 168 hours     Signed:  Carolyne Littles, MD [facility] [phone] pager

## 2013-10-05 NOTE — Progress Notes (Signed)
Subjective: No CP  Still producing whitish sputum  Breathing is OK Objective: Filed Vitals:   10/04/13 1442 10/04/13 2137 10/05/13 0606 10/05/13 0716  BP: 167/72 155/81 88/53 129/54  Pulse: 86 100 74 72  Temp: 98.8 F (37.1 C) 97.5 F (36.4 C) 98.7 F (37.1 C)   TempSrc: Oral Oral Oral   Resp: 20 20 20    Height:      Weight:      SpO2: 96% 93% 94%    Weight change:   Intake/Output Summary (Last 24 hours) at 10/05/13 0939 Last data filed at 10/05/13 0825  Gross per 24 hour  Intake    240 ml  Output      0 ml  Net    240 ml    General: Alert, awake, oriented x3, in no acute distress Neck:  JVP is normal Heart: Regular rate and rhythm, without murmurs, rubs, gallops.  Lungs:Rhonchi  Clears with cough Exemities:  No edema.   Neuro: Grossly intact, nonfocal.  Teel:  SR Lab Results: Results for orders placed during the hospital encounter of 10/03/13 (from the past 24 hour(s))  TROPONIN I     Status: None   Collection Time    10/04/13  2:00 PM      Result Value Ref Range   Troponin I <0.30  <0.30 ng/mL  INFLUENZA PANEL BY PCR (TYPE A & B, H1N1)     Status: None   Collection Time    10/04/13  4:42 PM      Result Value Ref Range   Influenza A By PCR NEGATIVE  NEGATIVE   Influenza B By PCR NEGATIVE  NEGATIVE   H1N1 flu by pcr NOT DETECTED  NOT DETECTED  BASIC METABOLIC PANEL     Status: Abnormal   Collection Time    10/04/13  5:00 PM      Result Value Ref Range   Sodium 136 (*) 137 - 147 mEq/L   Potassium 4.5  3.7 - 5.3 mEq/L   Chloride 99  96 - 112 mEq/L   CO2 22  19 - 32 mEq/L   Glucose, Bld 121 (*) 70 - 99 mg/dL   BUN 13  6 - 23 mg/dL   Creatinine, Ser 9.83 (*) 0.50 - 1.10 mg/dL   Calcium 9.4  8.4 - 38.2 mg/dL   GFR calc non Af Amer 46 (*) >90 mL/min   GFR calc Af Amer 54 (*) >90 mL/min  MAGNESIUM     Status: None   Collection Time    10/04/13  5:00 PM      Result Value Ref Range   Magnesium 2.3  1.5 - 2.5 mg/dL  LIPID PANEL     Status: None   Collection  Time    10/05/13  6:57 AM      Result Value Ref Range   Cholesterol 140  0 - 200 mg/dL   Triglycerides 66  <505 mg/dL   HDL 62  >39 mg/dL   Total CHOL/HDL Ratio 2.3     VLDL 13  0 - 40 mg/dL   LDL Cholesterol 65  0 - 99 mg/dL  COMPREHENSIVE METABOLIC PANEL     Status: Abnormal   Collection Time    10/05/13  6:57 AM      Result Value Ref Range   Sodium 136 (*) 137 - 147 mEq/L   Potassium 4.4  3.7 - 5.3 mEq/L   Chloride 99  96 - 112 mEq/L   CO2 20  19 -  32 mEq/L   Glucose, Bld 94  70 - 99 mg/dL   BUN 22  6 - 23 mg/dL   Creatinine, Ser 0.981.46 (*) 0.50 - 1.10 mg/dL   Calcium 9.0  8.4 - 11.910.5 mg/dL   Total Protein 6.6  6.0 - 8.3 g/dL   Albumin 3.4 (*) 3.5 - 5.2 g/dL   AST 27  0 - 37 U/L   ALT 23  0 - 35 U/L   Alkaline Phosphatase 93  39 - 117 U/L   Total Bilirubin <0.2 (*) 0.3 - 1.2 mg/dL   GFR calc non Af Amer 35 (*) >90 mL/min   GFR calc Af Amer 40 (*) >90 mL/min  MAGNESIUM     Status: None   Collection Time    10/05/13  6:57 AM      Result Value Ref Range   Magnesium 2.1  1.5 - 2.5 mg/dL    Studies/Results: @RISRSLT24 @  Medications:  Reviewed   @PROBHOSP @  1  CP  Atypical  Im am not convinced cardiac I have reviewed echo from this AM   LVEF is 50 to 55% I will follow up with patient as outpatient and set up for myoview with Lexiscan  2HTN  I have added HCTZ to regimen  SBP of 88 was an error this am   I will follow up with patient as outpatient  3.  HL  Lipids are excellent  4.  COPD  Uses O2 at night.  REcomm robitussin.   LOS: 2 days   Dietrich Patesaula Jahden Schara 10/05/2013, 9:39 AM

## 2013-10-08 LAB — RESPIRATORY VIRUS PANEL
Adenovirus: NOT DETECTED
INFLUENZA A H1: NOT DETECTED
INFLUENZA A H3: NOT DETECTED
INFLUENZA B 1: NOT DETECTED
Influenza A: NOT DETECTED
Metapneumovirus: NOT DETECTED
Parainfluenza 1: NOT DETECTED
Parainfluenza 2: NOT DETECTED
Parainfluenza 3: NOT DETECTED
RESPIRATORY SYNCYTIAL VIRUS A: NOT DETECTED
RHINOVIRUS: NOT DETECTED
Respiratory Syncytial Virus B: NOT DETECTED

## 2013-10-22 ENCOUNTER — Ambulatory Visit (HOSPITAL_COMMUNITY): Payer: Medicare Other | Attending: Cardiology | Admitting: Radiology

## 2013-10-22 VITALS — BP 131/86 | HR 67 | Ht 60.0 in | Wt 147.0 lb

## 2013-10-22 DIAGNOSIS — R0989 Other specified symptoms and signs involving the circulatory and respiratory systems: Secondary | ICD-10-CM | POA: Insufficient documentation

## 2013-10-22 DIAGNOSIS — J449 Chronic obstructive pulmonary disease, unspecified: Secondary | ICD-10-CM | POA: Insufficient documentation

## 2013-10-22 DIAGNOSIS — I447 Left bundle-branch block, unspecified: Secondary | ICD-10-CM | POA: Insufficient documentation

## 2013-10-22 DIAGNOSIS — Z87891 Personal history of nicotine dependence: Secondary | ICD-10-CM | POA: Insufficient documentation

## 2013-10-22 DIAGNOSIS — J4489 Other specified chronic obstructive pulmonary disease: Secondary | ICD-10-CM | POA: Insufficient documentation

## 2013-10-22 DIAGNOSIS — Z8673 Personal history of transient ischemic attack (TIA), and cerebral infarction without residual deficits: Secondary | ICD-10-CM | POA: Insufficient documentation

## 2013-10-22 DIAGNOSIS — R0609 Other forms of dyspnea: Secondary | ICD-10-CM | POA: Insufficient documentation

## 2013-10-22 DIAGNOSIS — R079 Chest pain, unspecified: Secondary | ICD-10-CM | POA: Insufficient documentation

## 2013-10-22 DIAGNOSIS — Z8249 Family history of ischemic heart disease and other diseases of the circulatory system: Secondary | ICD-10-CM | POA: Insufficient documentation

## 2013-10-22 DIAGNOSIS — I10 Essential (primary) hypertension: Secondary | ICD-10-CM | POA: Insufficient documentation

## 2013-10-22 DIAGNOSIS — I959 Hypotension, unspecified: Secondary | ICD-10-CM | POA: Insufficient documentation

## 2013-10-22 MED ORDER — TECHNETIUM TC 99M SESTAMIBI GENERIC - CARDIOLITE
11.0000 | Freq: Once | INTRAVENOUS | Status: AC | PRN
Start: 2013-10-22 — End: 2013-10-22
  Administered 2013-10-22: 11 via INTRAVENOUS

## 2013-10-22 MED ORDER — TECHNETIUM TC 99M SESTAMIBI GENERIC - CARDIOLITE
33.0000 | Freq: Once | INTRAVENOUS | Status: AC | PRN
  Administered 2013-10-22: 33 via INTRAVENOUS

## 2013-10-22 MED ORDER — ADENOSINE (DIAGNOSTIC) 3 MG/ML IV SOLN
0.5600 mg/kg | Freq: Once | INTRAVENOUS | Status: AC
  Administered 2013-10-22: 37.5 mg via INTRAVENOUS

## 2013-10-22 NOTE — Progress Notes (Signed)
  [facility] SITE 3 NUCLEAR MED [address] [place], [place] [address] [phone]    Cardiology Nuclear Med Study  Melanie Williamson is a 73 y.o. female     MRN : [id number]     DOB: 1941-05-07  Procedure Date: 10/22/2013  Nuclear Med Background Indication for Stress Test:  Evaluation for Ischemia and Post Hospital[facility] for Chest Pain History:  No known history of CAD; 10/05/13- Echo- EF= 50-55%; Previous Normal Cardiac CT; COPD Cardiac Risk Factors: Family History - CAD, History of Smoking, Hypertension, LBBB and TIA  Symptoms:  Chest Pain   Nuclear Pre-Procedure Caffeine/Decaff Intake:  9:00pm NPO After: 0945   Lungs:  clear O2 Sat: 95% on room air. IV 0.9% NS with Angio Cath:  22g  IV Site: R Hand  IV Started by:  Cathlyn Parsons, RN  Chest Size (in):  34 Cup Size: B  Height: 5' (1.524 m)  Weight:  147 lb (66.679 kg)  BMI:  Body mass index is 28.71 kg/(m^2). Tech Comments:  Toprol taken at 0930 today    Nuclear Med Study 1 or 2 day study: 1 day  Stress Test Type:  Adenosine  Reading MD: n/a  Order Authorizing Provider: Gunnar Fusi Ross,MD  Resting Radionuclide: Technetium 53m Sestamibi  Resting Radionuclide Dose: 11.0 mCi   Stress Radionuclide:  Technetium 22m Sestamibi  Stress Radionuclide Dose: 33.0 mCi           Stress Protocol Rest HR: 67 Stress HR: 81  Rest BP: 131/86 Stress BP: 89/71  Exercise Time (min): n/a METS: n/a           Dose of Adenosine (mg):  37.4 mg Dose of Lexiscan: n/a mg  Dose of Atropine (mg): n/a Dose of Dobutamine: n/a mcg/kg/min (at max HR)  Stress Test Technologist: Nelson Chimes, BS-ES  Nuclear Technologist:  Domenic Polite, CNMT     Rest Procedure:  Myocardial perfusion imaging was performed at rest 45 minutes following the intravenous administration of Technetium 47m Sestamibi. Rest ECG: NSR-LBBB  Stress Procedure:  The patient received IV adenosine at 140 mcg/kg/min for 4 minutes.  Technetium 75m Sestamibi was injected at  the 2 minute mark and quantitative spect images were obtained after a 45 minute delay.  During the infusion of Adenosine, the patient complained of chest tightness.  This resolved in recovery.  Stress ECG: Uninteretable due to baseline LBBB  QPS Raw Data Images:  Normal; no motion artifact; normal heart/lung ratio. Stress Images:  There is decreased uptake in the septum. Rest Images:  There is decreased uptake in the septum. Subtraction (SDS):  No evidence of ischemia. Transient Ischemic Dilatation (Normal <1.22):  0.85 Lung/Heart Ratio (Normal <0.45):  0.29  Quantitative Gated Spect Images QGS EDV:  52 ml QGS ESV:  12 ml  Impression Exercise Capacity:  Adenosine study with no exercise. BP Response:  Hypotensive blood pressure response. Clinical Symptoms:  Mild chest pain/dyspnea. ECG Impression:  Baseline:  LBBB.  EKG uninterpretable due to LBBB at rest and stress. Comparison with Prior Nuclear Study: No previous nuclear study performed  Overall Impression:  Low risk stress nuclear study with a small septal bundle branch defect, worse at stress than rest. No ischemia.  LV Ejection Fraction: 77%.  LV Wall Motion:  Normal Wall Motion  Chrystie Nose, MD, [facility] Board Certified in Nuclear Cardiology Attending Cardiologist [facility]

## 2013-10-23 ENCOUNTER — Other Ambulatory Visit: Payer: Self-pay | Admitting: *Deleted

## 2013-10-23 MED ORDER — HYDROCHLOROTHIAZIDE 12.5 MG PO CAPS: 12.5000 mg | ORAL_CAPSULE | Freq: Every day | ORAL | Status: DC

## 2013-11-04 ENCOUNTER — Encounter: Payer: Medicare Other | Admitting: Internal Medicine

## 2014-02-04 ENCOUNTER — Other Ambulatory Visit: Payer: Self-pay | Admitting: Orthopedic Surgery

## 2014-02-04 DIAGNOSIS — M545 Low back pain, unspecified: Secondary | ICD-10-CM

## 2014-02-11 ENCOUNTER — Other Ambulatory Visit: Payer: Self-pay | Admitting: Orthopedic Surgery

## 2014-02-28 ENCOUNTER — Inpatient Hospital Stay (HOSPITAL_COMMUNITY)
Admission: EM | Admit: 2014-02-28 | Discharge: 2014-03-25 | DRG: 207 | Disposition: E | Payer: Medicare Other | Attending: Internal Medicine | Admitting: Internal Medicine

## 2014-02-28 ENCOUNTER — Inpatient Hospital Stay (HOSPITAL_COMMUNITY): Payer: Medicare Other

## 2014-02-28 ENCOUNTER — Other Ambulatory Visit: Payer: Self-pay

## 2014-02-28 ENCOUNTER — Emergency Department (HOSPITAL_COMMUNITY): Payer: Medicare Other

## 2014-02-28 ENCOUNTER — Encounter (HOSPITAL_COMMUNITY): Payer: Self-pay | Admitting: Emergency Medicine

## 2014-02-28 DIAGNOSIS — G931 Anoxic brain damage, not elsewhere classified: Secondary | ICD-10-CM | POA: Diagnosis present

## 2014-02-28 DIAGNOSIS — J40 Bronchitis, not specified as acute or chronic: Secondary | ICD-10-CM

## 2014-02-28 DIAGNOSIS — Z87891 Personal history of nicotine dependence: Secondary | ICD-10-CM

## 2014-02-28 DIAGNOSIS — I469 Cardiac arrest, cause unspecified: Secondary | ICD-10-CM | POA: Diagnosis present

## 2014-02-28 DIAGNOSIS — J441 Chronic obstructive pulmonary disease with (acute) exacerbation: Secondary | ICD-10-CM | POA: Diagnosis present

## 2014-02-28 DIAGNOSIS — Z515 Encounter for palliative care: Secondary | ICD-10-CM

## 2014-02-28 DIAGNOSIS — B370 Candidal stomatitis: Secondary | ICD-10-CM | POA: Diagnosis not present

## 2014-02-28 DIAGNOSIS — E871 Hypo-osmolality and hyponatremia: Secondary | ICD-10-CM | POA: Diagnosis present

## 2014-02-28 DIAGNOSIS — I447 Left bundle-branch block, unspecified: Secondary | ICD-10-CM | POA: Diagnosis present

## 2014-02-28 DIAGNOSIS — R1310 Dysphagia, unspecified: Secondary | ICD-10-CM | POA: Diagnosis not present

## 2014-02-28 DIAGNOSIS — N184 Chronic kidney disease, stage 4 (severe): Secondary | ICD-10-CM | POA: Diagnosis present

## 2014-02-28 DIAGNOSIS — K140 Glossitis: Secondary | ICD-10-CM | POA: Diagnosis not present

## 2014-02-28 DIAGNOSIS — G9340 Encephalopathy, unspecified: Secondary | ICD-10-CM | POA: Diagnosis present

## 2014-02-28 DIAGNOSIS — T502X5A Adverse effect of carbonic-anhydrase inhibitors, benzothiadiazides and other diuretics, initial encounter: Secondary | ICD-10-CM | POA: Diagnosis present

## 2014-02-28 DIAGNOSIS — D638 Anemia in other chronic diseases classified elsewhere: Secondary | ICD-10-CM | POA: Diagnosis present

## 2014-02-28 DIAGNOSIS — R5381 Other malaise: Secondary | ICD-10-CM | POA: Diagnosis not present

## 2014-02-28 DIAGNOSIS — J9611 Chronic respiratory failure with hypoxia: Secondary | ICD-10-CM

## 2014-02-28 DIAGNOSIS — Z6826 Body mass index (BMI) 26.0-26.9, adult: Secondary | ICD-10-CM

## 2014-02-28 DIAGNOSIS — I471 Supraventricular tachycardia: Secondary | ICD-10-CM | POA: Diagnosis present

## 2014-02-28 DIAGNOSIS — J189 Pneumonia, unspecified organism: Secondary | ICD-10-CM | POA: Diagnosis present

## 2014-02-28 DIAGNOSIS — E876 Hypokalemia: Secondary | ICD-10-CM | POA: Diagnosis present

## 2014-02-28 DIAGNOSIS — R7309 Other abnormal glucose: Secondary | ICD-10-CM | POA: Diagnosis present

## 2014-02-28 DIAGNOSIS — E870 Hyperosmolality and hypernatremia: Secondary | ICD-10-CM | POA: Diagnosis not present

## 2014-02-28 DIAGNOSIS — E872 Acidosis, unspecified: Secondary | ICD-10-CM | POA: Diagnosis present

## 2014-02-28 DIAGNOSIS — J9811 Atelectasis: Secondary | ICD-10-CM

## 2014-02-28 DIAGNOSIS — I5023 Acute on chronic systolic (congestive) heart failure: Secondary | ICD-10-CM

## 2014-02-28 DIAGNOSIS — E785 Hyperlipidemia, unspecified: Secondary | ICD-10-CM | POA: Diagnosis present

## 2014-02-28 DIAGNOSIS — R4701 Aphasia: Secondary | ICD-10-CM | POA: Diagnosis present

## 2014-02-28 DIAGNOSIS — E41 Nutritional marasmus: Secondary | ICD-10-CM | POA: Diagnosis present

## 2014-02-28 DIAGNOSIS — R4182 Altered mental status, unspecified: Secondary | ICD-10-CM

## 2014-02-28 DIAGNOSIS — N189 Chronic kidney disease, unspecified: Secondary | ICD-10-CM

## 2014-02-28 DIAGNOSIS — J9600 Acute respiratory failure, unspecified whether with hypoxia or hypercapnia: Secondary | ICD-10-CM

## 2014-02-28 DIAGNOSIS — E875 Hyperkalemia: Secondary | ICD-10-CM | POA: Diagnosis not present

## 2014-02-28 DIAGNOSIS — I5021 Acute systolic (congestive) heart failure: Secondary | ICD-10-CM | POA: Diagnosis present

## 2014-02-28 DIAGNOSIS — I129 Hypertensive chronic kidney disease with stage 1 through stage 4 chronic kidney disease, or unspecified chronic kidney disease: Secondary | ICD-10-CM | POA: Diagnosis present

## 2014-02-28 DIAGNOSIS — S27309D Unspecified injury of lung, unspecified, subsequent encounter: Secondary | ICD-10-CM

## 2014-02-28 DIAGNOSIS — N183 Chronic kidney disease, stage 3 unspecified: Secondary | ICD-10-CM | POA: Diagnosis present

## 2014-02-28 DIAGNOSIS — J438 Other emphysema: Secondary | ICD-10-CM

## 2014-02-28 DIAGNOSIS — Z9981 Dependence on supplemental oxygen: Secondary | ICD-10-CM

## 2014-02-28 DIAGNOSIS — R000 Tachycardia, unspecified: Secondary | ICD-10-CM | POA: Diagnosis not present

## 2014-02-28 DIAGNOSIS — Z66 Do not resuscitate: Secondary | ICD-10-CM | POA: Diagnosis not present

## 2014-02-28 DIAGNOSIS — J9620 Acute and chronic respiratory failure, unspecified whether with hypoxia or hypercapnia: Secondary | ICD-10-CM | POA: Diagnosis present

## 2014-02-28 DIAGNOSIS — I10 Essential (primary) hypertension: Secondary | ICD-10-CM | POA: Diagnosis present

## 2014-02-28 DIAGNOSIS — Z79899 Other long term (current) drug therapy: Secondary | ICD-10-CM | POA: Diagnosis not present

## 2014-02-28 DIAGNOSIS — R579 Shock, unspecified: Secondary | ICD-10-CM | POA: Diagnosis present

## 2014-02-28 DIAGNOSIS — J411 Mucopurulent chronic bronchitis: Secondary | ICD-10-CM

## 2014-02-28 DIAGNOSIS — J449 Chronic obstructive pulmonary disease, unspecified: Secondary | ICD-10-CM

## 2014-02-28 DIAGNOSIS — I214 Non-ST elevation (NSTEMI) myocardial infarction: Secondary | ICD-10-CM | POA: Diagnosis present

## 2014-02-28 DIAGNOSIS — J690 Pneumonitis due to inhalation of food and vomit: Secondary | ICD-10-CM | POA: Diagnosis present

## 2014-02-28 DIAGNOSIS — I4891 Unspecified atrial fibrillation: Secondary | ICD-10-CM | POA: Diagnosis present

## 2014-02-28 DIAGNOSIS — R0602 Shortness of breath: Secondary | ICD-10-CM

## 2014-02-28 DIAGNOSIS — I429 Cardiomyopathy, unspecified: Secondary | ICD-10-CM

## 2014-02-28 DIAGNOSIS — R40243 Glasgow coma scale score 3-8, unspecified time: Secondary | ICD-10-CM

## 2014-02-28 DIAGNOSIS — E43 Unspecified severe protein-calorie malnutrition: Secondary | ICD-10-CM | POA: Diagnosis present

## 2014-02-28 DIAGNOSIS — F05 Delirium due to known physiological condition: Secondary | ICD-10-CM | POA: Diagnosis present

## 2014-02-28 DIAGNOSIS — J9601 Acute respiratory failure with hypoxia: Secondary | ICD-10-CM | POA: Diagnosis present

## 2014-02-28 DIAGNOSIS — R51 Headache: Secondary | ICD-10-CM

## 2014-02-28 DIAGNOSIS — N179 Acute kidney failure, unspecified: Secondary | ICD-10-CM | POA: Diagnosis present

## 2014-02-28 DIAGNOSIS — I480 Paroxysmal atrial fibrillation: Secondary | ICD-10-CM

## 2014-02-28 DIAGNOSIS — R471 Dysarthria and anarthria: Secondary | ICD-10-CM | POA: Diagnosis present

## 2014-02-28 DIAGNOSIS — I059 Rheumatic mitral valve disease, unspecified: Secondary | ICD-10-CM

## 2014-02-28 HISTORY — DX: Chronic obstructive pulmonary disease, unspecified: J44.9

## 2014-02-28 LAB — BASIC METABOLIC PANEL
ANION GAP: 15 (ref 5–15)
ANION GAP: 17 — AB (ref 5–15)
ANION GAP: 18 — AB (ref 5–15)
ANION GAP: 20 — AB (ref 5–15)
ANION GAP: 23 — AB (ref 5–15)
Anion gap: 15 (ref 5–15)
BUN: 21 mg/dL (ref 6–23)
BUN: 21 mg/dL (ref 6–23)
BUN: 23 mg/dL (ref 6–23)
BUN: 26 mg/dL — AB (ref 6–23)
BUN: 27 mg/dL — ABNORMAL HIGH (ref 6–23)
BUN: 28 mg/dL — ABNORMAL HIGH (ref 6–23)
CALCIUM: 7.3 mg/dL — AB (ref 8.4–10.5)
CALCIUM: 7.6 mg/dL — AB (ref 8.4–10.5)
CALCIUM: 5.2 mg/dL — AB (ref 8.4–10.5)
CHLORIDE: 103 meq/L (ref 96–112)
CO2: 11 meq/L — AB (ref 19–32)
CO2: 14 mEq/L — ABNORMAL LOW (ref 19–32)
CO2: 14 meq/L — AB (ref 19–32)
CO2: 17 mEq/L — ABNORMAL LOW (ref 19–32)
CO2: 15 mEq/L — ABNORMAL LOW (ref 19–32)
CO2: 13 meq/L — AB (ref 19–32)
CREATININE: 1.26 mg/dL — AB (ref 0.50–1.10)
CREATININE: 1.13 mg/dL — AB (ref 0.50–1.10)
CREATININE: 0.86 mg/dL (ref 0.50–1.10)
CREATININE: 1.25 mg/dL — AB (ref 0.50–1.10)
Calcium: 8.6 mg/dL (ref 8.4–10.5)
Calcium: 7.5 mg/dL — ABNORMAL LOW (ref 8.4–10.5)
Calcium: 7.8 mg/dL — ABNORMAL LOW (ref 8.4–10.5)
Chloride: 102 mEq/L (ref 96–112)
Chloride: 104 mEq/L (ref 96–112)
Chloride: 105 mEq/L (ref 96–112)
Chloride: 105 mEq/L (ref 96–112)
Chloride: 116 mEq/L — ABNORMAL HIGH (ref 96–112)
Creatinine, Ser: 1.32 mg/dL — ABNORMAL HIGH (ref 0.50–1.10)
Creatinine, Ser: 1.18 mg/dL — ABNORMAL HIGH (ref 0.50–1.10)
GFR calc Af Amer: 45 mL/min — ABNORMAL LOW (ref >90)
GFR calc Af Amer: 52 mL/min — ABNORMAL LOW (ref >90)
GFR calc Af Amer: 54 mL/min — ABNORMAL LOW (ref >90)
GFR calc Af Amer: 76 mL/min — ABNORMAL LOW (ref >90)
GFR calc Af Amer: 48 mL/min — ABNORMAL LOW (ref >90)
GFR calc non Af Amer: 41 mL/min — ABNORMAL LOW (ref >90)
GFR calc non Af Amer: 45 mL/min — ABNORMAL LOW (ref >90)
GFR calc non Af Amer: 47 mL/min — ABNORMAL LOW (ref >90)
GFR calc non Af Amer: 65 mL/min — ABNORMAL LOW (ref >90)
GFR calc non Af Amer: 42 mL/min — ABNORMAL LOW (ref >90)
GFR, EST AFRICAN AMERICAN: 48 mL/min — AB (ref >90)
GFR, EST NON AFRICAN AMERICAN: 39 mL/min — AB (ref >90)
Glucose, Bld: 215 mg/dL — ABNORMAL HIGH (ref 70–99)
Glucose, Bld: 281 mg/dL — ABNORMAL HIGH (ref 70–99)
Glucose, Bld: 317 mg/dL — ABNORMAL HIGH (ref 70–99)
Glucose, Bld: 285 mg/dL — ABNORMAL HIGH (ref 70–99)
Glucose, Bld: 138 mg/dL — ABNORMAL HIGH (ref 70–99)
Glucose, Bld: 133 mg/dL — ABNORMAL HIGH (ref 70–99)
POTASSIUM: 4.9 meq/L (ref 3.7–5.3)
Potassium: 4.3 mEq/L (ref 3.7–5.3)
Potassium: 3.4 mEq/L — ABNORMAL LOW (ref 3.7–5.3)
Potassium: 3.6 mEq/L — ABNORMAL LOW (ref 3.7–5.3)
Potassium: 2.7 mEq/L — CL (ref 3.7–5.3)
Potassium: 4.1 mEq/L (ref 3.7–5.3)
SODIUM: 139 meq/L (ref 137–147)
Sodium: 136 mEq/L — ABNORMAL LOW (ref 137–147)
Sodium: 137 mEq/L (ref 137–147)
Sodium: 137 mEq/L (ref 137–147)
Sodium: 142 mEq/L (ref 137–147)
Sodium: 136 mEq/L — ABNORMAL LOW (ref 137–147)

## 2014-02-28 LAB — URINALYSIS, ROUTINE W REFLEX MICROSCOPIC
Bilirubin Urine: NEGATIVE
Glucose, UA: NEGATIVE mg/dL
Hgb urine dipstick: NEGATIVE
Ketones, ur: NEGATIVE mg/dL
Leukocytes, UA: NEGATIVE
Nitrite: NEGATIVE
PROTEIN: NEGATIVE mg/dL
Specific Gravity, Urine: 1.024 (ref 1.005–1.030)
UROBILINOGEN UA: 0.2 mg/dL (ref 0.0–1.0)
pH: 5 (ref 5.0–8.0)

## 2014-02-28 LAB — POCT I-STAT 3, ART BLOOD GAS (G3+)
Acid-base deficit: 10 mmol/L — ABNORMAL HIGH (ref 0.0–2.0)
BICARBONATE: 18.2 meq/L — AB (ref 20.0–24.0)
O2 Saturation: 90 %
PCO2 ART: 44.1 mmHg (ref 35.0–45.0)
PH ART: 7.218 — AB (ref 7.350–7.450)
PO2 ART: 66 mmHg — AB (ref 80.0–100.0)
Patient temperature: 96.4
TCO2: 20 mmol/L (ref 0–100)

## 2014-02-28 LAB — I-STAT ARTERIAL BLOOD GAS, ED
Acid-base deficit: 11 mmol/L — ABNORMAL HIGH (ref 0.0–2.0)
BICARBONATE: 18.8 meq/L — AB (ref 20.0–24.0)
O2 Saturation: 96 %
PH ART: 7.129 — AB (ref 7.350–7.450)
TCO2: 21 mmol/L (ref 0–100)
pCO2 arterial: 56.7 mmHg — ABNORMAL HIGH (ref 35.0–45.0)
pO2, Arterial: 105 mmHg — ABNORMAL HIGH (ref 80.0–100.0)

## 2014-02-28 LAB — I-STAT TROPONIN, ED: TROPONIN I, POC: 2.97 ng/mL — AB (ref 0.00–0.08)

## 2014-02-28 LAB — CBC
HCT: 37.3 % (ref 36.0–46.0)
HEMATOCRIT: 36.8 % (ref 36.0–46.0)
Hemoglobin: 11.7 g/dL — ABNORMAL LOW (ref 12.0–15.0)
Hemoglobin: 11.7 g/dL — ABNORMAL LOW (ref 12.0–15.0)
MCH: 31 pg (ref 26.0–34.0)
MCH: 31.2 pg (ref 26.0–34.0)
MCHC: 31.4 g/dL (ref 30.0–36.0)
MCHC: 31.8 g/dL (ref 30.0–36.0)
MCV: 97.4 fL (ref 78.0–100.0)
MCV: 99.5 fL (ref 78.0–100.0)
PLATELETS: 349 10*3/uL (ref 150–400)
Platelets: 397 10*3/uL (ref 150–400)
RBC: 3.75 MIL/uL — ABNORMAL LOW (ref 3.87–5.11)
RBC: 3.78 MIL/uL — ABNORMAL LOW (ref 3.87–5.11)
RDW: 13.1 % (ref 11.5–15.5)
RDW: 13.2 % (ref 11.5–15.5)
WBC: 15.4 10*3/uL — AB (ref 4.0–10.5)
WBC: 18.4 10*3/uL — ABNORMAL HIGH (ref 4.0–10.5)

## 2014-02-28 LAB — POCT I-STAT, CHEM 8
BUN: 21 mg/dL (ref 6–23)
BUN: 24 mg/dL — AB (ref 6–23)
CALCIUM ION: 1.08 mmol/L — AB (ref 1.13–1.30)
Calcium, Ion: 1.11 mmol/L — ABNORMAL LOW (ref 1.13–1.30)
Chloride: 110 mEq/L (ref 96–112)
Chloride: 108 mEq/L (ref 96–112)
Creatinine, Ser: 1.1 mg/dL (ref 0.50–1.10)
Creatinine, Ser: 1.3 mg/dL — ABNORMAL HIGH (ref 0.50–1.10)
GLUCOSE: 374 mg/dL — AB (ref 70–99)
Glucose, Bld: 293 mg/dL — ABNORMAL HIGH (ref 70–99)
HCT: 37 % (ref 36.0–46.0)
HCT: 40 % (ref 36.0–46.0)
HEMOGLOBIN: 13.6 g/dL (ref 12.0–15.0)
Hemoglobin: 12.6 g/dL (ref 12.0–15.0)
POTASSIUM: 3.1 meq/L — AB (ref 3.7–5.3)
Potassium: 3.8 mEq/L (ref 3.7–5.3)
SODIUM: 137 meq/L (ref 137–147)
Sodium: 142 mEq/L (ref 137–147)
TCO2: 17 mmol/L (ref 0–100)
TCO2: 17 mmol/L (ref 0–100)

## 2014-02-28 LAB — TROPONIN I
TROPONIN I: 1.38 ng/mL — AB (ref <0.30)
TROPONIN I: 1.41 ng/mL — AB (ref <0.30)
Troponin I: 1.35 ng/mL (ref <0.30)
Troponin I: 1.15 ng/mL (ref <0.30)
Troponin I: 1.42 ng/mL (ref <0.30)

## 2014-02-28 LAB — GLUCOSE, CAPILLARY
GLUCOSE-CAPILLARY: 286 mg/dL — AB (ref 70–99)
GLUCOSE-CAPILLARY: 224 mg/dL — AB (ref 70–99)
GLUCOSE-CAPILLARY: 122 mg/dL — AB (ref 70–99)
Glucose-Capillary: 241 mg/dL — ABNORMAL HIGH (ref 70–99)
Glucose-Capillary: 293 mg/dL — ABNORMAL HIGH (ref 70–99)
Glucose-Capillary: 368 mg/dL — ABNORMAL HIGH (ref 70–99)
Glucose-Capillary: 313 mg/dL — ABNORMAL HIGH (ref 70–99)
Glucose-Capillary: 225 mg/dL — ABNORMAL HIGH (ref 70–99)
Glucose-Capillary: 261 mg/dL — ABNORMAL HIGH (ref 70–99)
Glucose-Capillary: 268 mg/dL — ABNORMAL HIGH (ref 70–99)
Glucose-Capillary: 252 mg/dL — ABNORMAL HIGH (ref 70–99)
Glucose-Capillary: 206 mg/dL — ABNORMAL HIGH (ref 70–99)
Glucose-Capillary: 169 mg/dL — ABNORMAL HIGH (ref 70–99)
Glucose-Capillary: 162 mg/dL — ABNORMAL HIGH (ref 70–99)
Glucose-Capillary: 144 mg/dL — ABNORMAL HIGH (ref 70–99)
Glucose-Capillary: 124 mg/dL — ABNORMAL HIGH (ref 70–99)
Glucose-Capillary: 120 mg/dL — ABNORMAL HIGH (ref 70–99)

## 2014-02-28 LAB — BLOOD GAS, ARTERIAL
Acid-base deficit: 11.2 mmol/L — ABNORMAL HIGH (ref 0.0–2.0)
Bicarbonate: 15.7 mEq/L — ABNORMAL LOW (ref 20.0–24.0)
Drawn by: 23588
FIO2: 1 %
MECHVT: 450 mL
O2 Saturation: 99.1 %
PATIENT TEMPERATURE: 92.5
PEEP/CPAP: 8 cmH2O
PH ART: 7.219 — AB (ref 7.350–7.450)
RATE: 24 resp/min
TCO2: 17 mmol/L (ref 0–100)
pCO2 arterial: 37.6 mmHg (ref 35.0–45.0)
pO2, Arterial: 183 mmHg — ABNORMAL HIGH (ref 80.0–100.0)

## 2014-02-28 LAB — CBG MONITORING, ED: Glucose-Capillary: 189 mg/dL — ABNORMAL HIGH (ref 70–99)

## 2014-02-28 LAB — PROTIME-INR
INR: 1.27 (ref 0.00–1.49)
INR: 1.11 (ref 0.00–1.49)
PROTHROMBIN TIME: 14.3 s (ref 11.6–15.2)
Prothrombin Time: 15.9 seconds — ABNORMAL HIGH (ref 11.6–15.2)

## 2014-02-28 LAB — LACTIC ACID, PLASMA
LACTIC ACID, VENOUS: 3 mmol/L — AB (ref 0.5–2.2)
LACTIC ACID, VENOUS: 3.3 mmol/L — AB (ref 0.5–2.2)
Lactic Acid, Venous: 3.5 mmol/L — ABNORMAL HIGH (ref 0.5–2.2)

## 2014-02-28 LAB — APTT: APTT: 33 s (ref 24–37)

## 2014-02-28 LAB — MRSA PCR SCREENING: MRSA by PCR: NEGATIVE

## 2014-02-28 LAB — I-STAT CG4 LACTIC ACID, ED: Lactic Acid, Venous: 8.15 mmol/L — ABNORMAL HIGH (ref 0.5–2.2)

## 2014-02-28 LAB — PROCALCITONIN: Procalcitonin: 4.62 ng/mL

## 2014-02-28 LAB — HEPARIN LEVEL (UNFRACTIONATED)
Heparin Unfractionated: <0.1 IU/mL — ABNORMAL LOW (ref 0.30–0.70)
Heparin Unfractionated: <0.1 IU/mL — ABNORMAL LOW (ref 0.30–0.70)

## 2014-02-28 LAB — MAGNESIUM: Magnesium: 1.8 mg/dL (ref 1.5–2.5)

## 2014-02-28 MED ORDER — ASPIRIN 325 MG PO TABS: 325.0000 mg | ORAL_TABLET | Freq: Every day | ORAL | Status: DC

## 2014-02-28 MED ORDER — PANTOPRAZOLE SODIUM 40 MG PO PACK: 40.0000 mg | PACK | Freq: Every day | ORAL | Status: DC

## 2014-02-28 MED ORDER — PROPOFOL 10 MG/ML IV EMUL
5.0000 ug/kg/min | INTRAVENOUS | Status: DC
  Administered 2014-02-28: 5 ug/kg/min via INTRAVENOUS
  Filled 2014-02-28: qty 100

## 2014-02-28 MED ORDER — VANCOMYCIN HCL IN DEXTROSE 1-5 GM/200ML-% IV SOLN
1000.0000 mg | INTRAVENOUS | Status: DC
  Administered 2014-02-28 – 2014-03-01 (×2): 1000 mg via INTRAVENOUS
  Filled 2014-02-28 (×3): qty 200

## 2014-02-28 MED ORDER — INSULIN REGULAR HUMAN 100 UNIT/ML IJ SOLN
INTRAMUSCULAR | Status: DC
  Administered 2014-02-28: 11.7 [IU]/h via INTRAVENOUS
  Administered 2014-02-28: 2.3 [IU]/h via INTRAVENOUS
  Filled 2014-02-28 (×2): qty 1

## 2014-02-28 MED ORDER — ATORVASTATIN CALCIUM 40 MG PO TABS
40.0000 mg | ORAL_TABLET | Freq: Every day | ORAL | Status: DC
  Administered 2014-02-28 – 2014-03-15 (×15): 40 mg via ORAL
  Filled 2014-02-28 (×17): qty 1

## 2014-02-28 MED ORDER — CISATRACURIUM BOLUS VIA INFUSION: 0.0500 mg/kg | INTRAVENOUS | Status: DC | PRN

## 2014-02-28 MED ORDER — FENTANYL CITRATE 0.05 MG/ML IJ SOLN: 100.0000 ug | Freq: Once | INTRAMUSCULAR | Status: DC | PRN

## 2014-02-28 MED ORDER — CISATRACURIUM BOLUS VIA INFUSION
0.0500 mg/kg | INTRAVENOUS | Status: DC | PRN
Start: 2014-02-28 — End: 2014-03-01
  Filled 2014-02-28: qty 4

## 2014-02-28 MED ORDER — INSULIN ASPART 100 UNIT/ML ~~LOC~~ SOLN
2.0000 [IU] | SUBCUTANEOUS | Status: DC
  Administered 2014-02-28: 6 [IU] via SUBCUTANEOUS

## 2014-02-28 MED ORDER — SODIUM CHLORIDE 0.9 % IV SOLN
1.0000 mg/h | INTRAVENOUS | Status: DC
  Administered 2014-02-28 – 2014-03-01 (×2): 4 mg/h via INTRAVENOUS
  Filled 2014-02-28 (×4): qty 10

## 2014-02-28 MED ORDER — DEXTROSE 10 % IV SOLN: INTRAVENOUS | Status: DC | PRN

## 2014-02-28 MED ORDER — PANTOPRAZOLE SODIUM 40 MG IV SOLR
40.0000 mg | INTRAVENOUS | Status: DC
  Administered 2014-02-28 – 2014-03-01 (×3): 40 mg via INTRAVENOUS
  Filled 2014-02-28 (×4): qty 40

## 2014-02-28 MED ORDER — SODIUM CHLORIDE 0.9 % IV SOLN
2000.0000 mL | Freq: Once | INTRAVENOUS | Status: AC
  Administered 2014-02-28: 2000 mL via INTRAVENOUS

## 2014-02-28 MED ORDER — FUROSEMIDE 10 MG/ML IJ SOLN
60.0000 mg | Freq: Once | INTRAMUSCULAR | Status: AC
  Administered 2014-02-28: 60 mg via INTRAVENOUS

## 2014-02-28 MED ORDER — CISATRACURIUM BOLUS VIA INFUSION
0.1000 mg/kg | Freq: Once | INTRAVENOUS | Status: AC
  Administered 2014-02-28: 6.8 mg via INTRAVENOUS
  Filled 2014-02-28: qty 7

## 2014-02-28 MED ORDER — IPRATROPIUM BROMIDE 0.02 % IN SOLN
0.5000 mg | Freq: Once | RESPIRATORY_TRACT | Status: AC
  Administered 2014-02-28: 0.5 mg via RESPIRATORY_TRACT
  Filled 2014-02-28: qty 2.5

## 2014-02-28 MED ORDER — HEPARIN BOLUS VIA INFUSION
2000.0000 [IU] | Freq: Once | INTRAVENOUS | Status: AC
  Administered 2014-02-28: 2000 [IU] via INTRAVENOUS
  Filled 2014-02-28: qty 2000

## 2014-02-28 MED ORDER — DEXTROSE 5 % IV SOLN
0.5000 ug/min | INTRAVENOUS | Status: DC
  Administered 2014-02-28: 3 ug/min via INTRAVENOUS
  Filled 2014-02-28 (×2): qty 4

## 2014-02-28 MED ORDER — METHYLPREDNISOLONE SODIUM SUCC 125 MG IJ SOLR
125.0000 mg | Freq: Once | INTRAMUSCULAR | Status: AC
  Administered 2014-02-28: 125 mg via INTRAVENOUS
  Filled 2014-02-28: qty 2

## 2014-02-28 MED ORDER — POTASSIUM CHLORIDE 10 MEQ/50ML IV SOLN
10.0000 meq | INTRAVENOUS | Status: AC
  Administered 2014-02-28 (×4): 10 meq via INTRAVENOUS
  Filled 2014-02-28: qty 50

## 2014-02-28 MED ORDER — FENTANYL BOLUS VIA INFUSION: 50.0000 ug | INTRAVENOUS | Status: DC | PRN

## 2014-02-28 MED ORDER — LEVOFLOXACIN IN D5W 500 MG/100ML IV SOLN
500.0000 mg | INTRAVENOUS | Status: DC
  Administered 2014-03-02: 500 mg via INTRAVENOUS
  Filled 2014-02-28 (×2): qty 100

## 2014-02-28 MED ORDER — LEVOFLOXACIN IN D5W 500 MG/100ML IV SOLN
500.0000 mg | INTRAVENOUS | Status: DC
  Administered 2014-02-28: 500 mg via INTRAVENOUS
  Filled 2014-02-28: qty 100

## 2014-02-28 MED ORDER — ASPIRIN 325 MG PO TABS
325.0000 mg | ORAL_TABLET | Freq: Every day | ORAL | Status: DC
  Administered 2014-03-01 – 2014-03-03 (×3): 325 mg via ORAL
  Filled 2014-02-28 (×4): qty 1

## 2014-02-28 MED ORDER — ARTIFICIAL TEARS OP OINT
TOPICAL_OINTMENT | Freq: Three times a day (TID) | OPHTHALMIC | Status: DC
  Administered 2014-02-28: 1 via OPHTHALMIC
  Administered 2014-02-28: 16:00:00 via OPHTHALMIC
  Administered 2014-03-01: 1 via OPHTHALMIC
  Administered 2014-03-01: 06:00:00 via OPHTHALMIC
  Filled 2014-02-28 (×3): qty 3.5

## 2014-02-28 MED ORDER — ASPIRIN 300 MG RE SUPP
300.0000 mg | RECTAL | Status: AC
  Administered 2014-02-28: 300 mg via RECTAL

## 2014-02-28 MED ORDER — LEVOFLOXACIN IN D5W 500 MG/100ML IV SOLN: 500.0000 mg | INTRAVENOUS | Status: DC

## 2014-02-28 MED ORDER — HEPARIN BOLUS VIA INFUSION
3000.0000 [IU] | Freq: Once | INTRAVENOUS | Status: AC
  Administered 2014-02-28: 3000 [IU] via INTRAVENOUS
  Filled 2014-02-28: qty 3000

## 2014-02-28 MED ORDER — ALBUTEROL SULFATE (2.5 MG/3ML) 0.083% IN NEBU: 2.5000 mg | INHALATION_SOLUTION | RESPIRATORY_TRACT | Status: DC | PRN

## 2014-02-28 MED ORDER — NOREPINEPHRINE BITARTRATE 1 MG/ML IV SOLN: 0.5000 ug/min | INTRAVENOUS | Status: DC

## 2014-02-28 MED ORDER — CHLORHEXIDINE GLUCONATE 0.12 % MT SOLN: 15.0000 mL | Freq: Two times a day (BID) | OROMUCOSAL | Status: DC

## 2014-02-28 MED ORDER — NOREPINEPHRINE BITARTRATE 1 MG/ML IV SOLN
2.0000 ug/min | INTRAVENOUS | Status: DC
  Administered 2014-02-28: 40 ug/min via INTRAVENOUS
  Administered 2014-03-01: 25 ug/min via INTRAVENOUS
  Filled 2014-02-28 (×4): qty 16

## 2014-02-28 MED ORDER — SODIUM CHLORIDE 0.9 % IV SOLN
250.0000 mL | INTRAVENOUS | Status: DC | PRN
  Administered 2014-03-08: 250 mL via INTRAVENOUS

## 2014-02-28 MED ORDER — POTASSIUM CHLORIDE 10 MEQ/50ML IV SOLN
INTRAVENOUS | Status: AC
  Filled 2014-02-28: qty 100

## 2014-02-28 MED ORDER — PIPERACILLIN-TAZOBACTAM 3.375 G IVPB
3.3750 g | Freq: Three times a day (TID) | INTRAVENOUS | Status: DC
  Administered 2014-02-28 – 2014-03-04 (×12): 3.375 g via INTRAVENOUS
  Filled 2014-02-28 (×15): qty 50

## 2014-02-28 MED ORDER — FENTANYL BOLUS VIA INFUSION
50.0000 ug | INTRAVENOUS | Status: DC | PRN
  Filled 2014-02-28: qty 50

## 2014-02-28 MED ORDER — IPRATROPIUM-ALBUTEROL 0.5-2.5 (3) MG/3ML IN SOLN
3.0000 mL | RESPIRATORY_TRACT | Status: DC
Start: 2014-02-28 — End: 2014-03-01
  Administered 2014-02-28 – 2014-03-01 (×8): 3 mL via RESPIRATORY_TRACT
  Filled 2014-02-28 (×8): qty 3

## 2014-02-28 MED ORDER — CISATRACURIUM BESYLATE 10 MG/ML IV SOLN: 1.0000 ug/kg/min | INTRAVENOUS | Status: DC

## 2014-02-28 MED ORDER — BUDESONIDE 0.25 MG/2ML IN SUSP
0.2500 mg | Freq: Two times a day (BID) | RESPIRATORY_TRACT | Status: DC
  Administered 2014-02-28 – 2014-03-01 (×3): 0.25 mg via RESPIRATORY_TRACT
  Filled 2014-02-28 (×5): qty 2

## 2014-02-28 MED ORDER — CETYLPYRIDINIUM CHLORIDE 0.05 % MT LIQD
7.0000 mL | Freq: Four times a day (QID) | OROMUCOSAL | Status: DC
  Administered 2014-02-28: 7 mL via OROMUCOSAL

## 2014-02-28 MED ORDER — SODIUM CHLORIDE 0.9 % IV SOLN
25.0000 ug/h | INTRAVENOUS | Status: DC
  Administered 2014-02-28: 50 ug/h via INTRAVENOUS
  Administered 2014-03-01: 100 ug/h via INTRAVENOUS
  Filled 2014-02-28 (×2): qty 50

## 2014-02-28 MED ORDER — FENTANYL CITRATE 0.05 MG/ML IJ SOLN: 100.0000 ug | Freq: Once | INTRAMUSCULAR | Status: DC

## 2014-02-28 MED ORDER — ALBUTEROL (5 MG/ML) CONTINUOUS INHALATION SOLN
10.0000 mg/h | INHALATION_SOLUTION | RESPIRATORY_TRACT | Status: DC
  Administered 2014-02-28: 10 mg/h via RESPIRATORY_TRACT
  Filled 2014-02-28: qty 100

## 2014-02-28 MED ORDER — CHLORHEXIDINE GLUCONATE 0.12 % MT SOLN
15.0000 mL | Freq: Once | OROMUCOSAL | Status: AC
  Administered 2014-02-28: 15 mL via OROMUCOSAL
  Filled 2014-02-28: qty 15

## 2014-02-28 MED ORDER — SODIUM CHLORIDE 0.9 % IV SOLN
INTRAVENOUS | Status: DC
Start: 2014-02-28 — End: 2014-03-14
  Administered 2014-02-28 – 2014-03-12 (×5): via INTRAVENOUS

## 2014-02-28 MED ORDER — CETYLPYRIDINIUM CHLORIDE 0.05 % MT LIQD
7.0000 mL | Freq: Four times a day (QID) | OROMUCOSAL | Status: DC
  Administered 2014-02-28 – 2014-03-15 (×58): 7 mL via OROMUCOSAL

## 2014-02-28 MED ORDER — PROPOFOL 10 MG/ML IV EMUL
INTRAVENOUS | Status: AC
  Filled 2014-02-28: qty 100

## 2014-02-28 MED ORDER — VASOPRESSIN 20 UNIT/ML IJ SOLN
0.0300 [IU]/min | INTRAVENOUS | Status: DC
  Filled 2014-02-28: qty 2.5

## 2014-02-28 MED ORDER — INSULIN GLARGINE 100 UNIT/ML ~~LOC~~ SOLN
30.0000 [IU] | SUBCUTANEOUS | Status: DC
  Administered 2014-02-28: 30 [IU] via SUBCUTANEOUS
  Filled 2014-02-28 (×3): qty 0.3

## 2014-02-28 MED ORDER — SODIUM CHLORIDE 0.9 % IV SOLN: 25.0000 ug/h | INTRAVENOUS | Status: DC

## 2014-02-28 MED ORDER — HEPARIN (PORCINE) IN NACL 100-0.45 UNIT/ML-% IJ SOLN
1100.0000 [IU]/h | INTRAMUSCULAR | Status: DC
  Administered 2014-02-28: 850 [IU]/h via INTRAVENOUS
  Administered 2014-02-28: 650 [IU]/h via INTRAVENOUS
  Administered 2014-02-28: 800 [IU]/h via INTRAVENOUS
  Administered 2014-03-01: 1100 [IU]/h via INTRAVENOUS
  Filled 2014-02-28 (×3): qty 250

## 2014-02-28 MED ORDER — INSULIN ASPART 100 UNIT/ML ~~LOC~~ SOLN
2.0000 [IU] | SUBCUTANEOUS | Status: DC
  Administered 2014-03-01 (×2): 4 [IU] via SUBCUTANEOUS

## 2014-02-28 MED ORDER — POTASSIUM CHLORIDE 10 MEQ/50ML IV SOLN
10.0000 meq | INTRAVENOUS | Status: AC
  Administered 2014-02-28 (×2): 10 meq via INTRAVENOUS
  Filled 2014-02-28 (×2): qty 50

## 2014-02-28 MED ORDER — CISATRACURIUM BOLUS VIA INFUSION
0.1000 mg/kg | Freq: Once | INTRAVENOUS | Status: DC
Start: 2014-02-28 — End: 2014-02-28

## 2014-02-28 MED ORDER — SODIUM CHLORIDE 0.9 % IV SOLN
1.0000 ug/kg/min | INTRAVENOUS | Status: DC
  Administered 2014-02-28: 1 ug/kg/min via INTRAVENOUS
  Filled 2014-02-28 (×2): qty 20

## 2014-02-28 MED ORDER — PROPOFOL 10 MG/ML IV EMUL
5.0000 ug/kg/min | Freq: Once | INTRAVENOUS | Status: AC
  Administered 2014-02-28: 5 ug/kg/min via INTRAVENOUS

## 2014-02-28 MED ORDER — VASOPRESSIN 20 UNIT/ML IJ SOLN
0.0300 [IU]/min | INTRAVENOUS | Status: DC
  Administered 2014-02-28 – 2014-03-01 (×2): 0.03 [IU]/min via INTRAVENOUS
  Filled 2014-02-28: qty 2.5

## 2014-02-28 MED ORDER — FENTANYL CITRATE 0.05 MG/ML IJ SOLN: 100.0000 ug | INTRAMUSCULAR | Status: DC | PRN

## 2014-02-28 MED ORDER — CHLORHEXIDINE GLUCONATE 0.12 % MT SOLN
15.0000 mL | Freq: Two times a day (BID) | OROMUCOSAL | Status: DC
  Administered 2014-02-28 – 2014-03-15 (×30): 15 mL via OROMUCOSAL
  Filled 2014-02-28 (×30): qty 15

## 2014-02-28 NOTE — ED Notes (Signed)
Per ems pt lives with daughter. Pt called out to call 911 d/t trouble breathing. Upon ems arrival pt with agonal breathing and found to be in PEA. ems administered 3 doses of epi. Compressions x10 mins and then pt achieved ROSC. GCS remains 3.

## 2014-02-28 NOTE — Consult Note (Cosign Needed)
CARDIOLOGY CONSULT NOTE   Patient ID: Melanie Williamson MRN: [id number], DOB/AGE: 74/07/1940   Admit date: 03/19/2014 Date of Consult: 03/23/2014   Primary Physician: Warrick ParisianSTALLINGS,SHEILA, MD Primary Cardiologist: None  CC: PEA Arrest  Problem List  Past Medical History  Diagnosis Date  . Hypertension   . Hyperlipidemia 07/17/2011    History reviewed. No pertinent past surgical history.   Allergies  No Known Allergies  HPI   The patient is a 77F with a history of HTN, HLD, COPD, and chronic respiratory failure on home O2 who, earlier this evening, called out to her daughter saying that she had difficulty breathing. Her daughter called 911 and EMS arrived about 8 minutes later. The presenting rhythm was PEA with agonal respirations, and the team performed CPR for 10 minutes with compressions and 3 rounds of epinephrine, eventually regaining ROSC. The patient was noted to be GCS 3 throughout. Her labs revealed a Tn of 2.9, so cardiology was consulted for additional evaluation and management.  Of note, per report she described no chest pain prior to the episode.   ABG obtained in the ED was 7.13/57/105. EKG revealed baseline LBBB without acute changes.  Inpatient Medications  . aspirin  300 mg Rectal NOW  . fentaNYL  100 mcg Intravenous Once  . ipratropium-albuterol  3 mL Nebulization Q4H  . pantoprazole (PROTONIX) IV  40 mg Intravenous Q24H    Family History No family history on file.   Social History History   Social History  . Marital Status: Divorced    Spouse Name: N/A    Number of Children: N/A  . Years of Education: N/A   Occupational History  . Not on file.   Social History Main Topics  . Smoking status: Former Games developermoker  . Smokeless tobacco: Never Used  . Alcohol Use: No  . Drug Use: No  . Sexual Activity: No   Other Topics Concern  . Not on file   Social History Narrative  . No narrative on file     Review of Systems Unable to obtain  Physical  Exam  Blood pressure 153/90, pulse 116, temperature 97 F (36.1 C), temperature source Core (Comment), resp. rate 27, weight 150 lb (68.04 kg), SpO2 94.00%.  General: Intubated, sedated Neuro: Intubated and sedated. No spontaneous movement of her extremities HEENT: Normal  Neck: Supple without bruits or JVD. Lungs:  Bilateral rales and rhonchi Heart: RRR no s3, s4, or murmurs. Abdomen: Soft, non-tender, non-distended, BS + x 4.  Extremities: No clubbing, cyanosis or edema. DP/PT/Radials 2+ and equal bilaterally.  Labs  Lab Results  Component Value Date   WBC 15.4* 03/15/2014   HGB 11.7* 03/01/2014   HCT 37.3 03/02/2014   MCV 99.5 03/06/2014   PLT 349 03/15/2014    Recent Labs Lab 03/03/2014 0050  NA 139  K 4.9  CL 102  CO2 14*  BUN 21  CREATININE 1.32*  CALCIUM 8.6  GLUCOSE 215*   Lab Results  Component Value Date   CHOL 140 10/05/2013   HDL 62 10/05/2013   LDLCALC 65 10/05/2013   TRIG 66 10/05/2013   Lab Results  Component Value Date   DDIMER 16.06* 10/03/2013    Radiology/Studies  03/11/2014   CLINICAL DATA:  Assess endotracheal tube.  Status post CPR.  EXAM: PORTABLE CHEST - 1 VIEW  COMPARISON:  Chest radiograph October 03, 2013  FINDINGS: Endotracheal tube tip projects 3.5 cm above the carina. Nasogastric tube side port projects in proximal stomach, distal  tip not imaged. Cardiac silhouette appears mildly enlarged. Tortuous calcified aorta. Mediastinal silhouette is nonsuspicious. Increased lung volumes may reflect underlying COPD, unchanged. Diffuse interstitial prominence with patchy central alveolar airspace opacities. No pleural effusions. No pneumothorax.  Vascular calcifications projecting in the upper extremities. Osteopenia.  IMPRESSION: Endotracheal tube tip projects 3.5 cm above the carina, nasogastric tube past the proximal stomach.  Mild cardiomegaly, interstitial and alveolar airspace opacities suggesting pulmonary edema, less likely ARDS.   Electronically Signed   By:  Awilda Metro   On: 02/22/2014 01:06    ECG SR @120bpm , LBBB  ASSESSMENT AND PLAN 56F with a history of HTN, HLD, COPD, and chronic respiratory failure on home O2, presenting with PEA arrest of unclear etiology. This is a complicated patient with multiple comorbidities and a prior history of COPD and chronic respiratory failure. Based on the history, it seems like the primary etiology for her PEA arrest may be respiratory. At this point, given her EKG findings and unchanged baseline LBBB, along with her neurologic status, we will defer going to the cardiac cath lab to delineate her coronary anatomy.   1. NSTEMI: She likely has a troponin elevation in the setting of her cardiac arrest, unclear at this point whether it is a type 1 vs type 2 MI -ASA 81mg  -Reasonable to start heparin, but given compressions, etc. Would start with no bolus -can start atorvastatin 80mg  -hold betablocker at this point -continue to trend biomarkers. If the continue to rise markedly can consider cardiac cath, but would reassess her overall prognosis prior to proceeding.  Thank you for this consult. We will follow along with you  Signed, Sammuel Hines, MD MPH 03/02/2014, 2:24 AM

## 2014-02-28 NOTE — Progress Notes (Signed)
  Echocardiogram 2D Echocardiogram has been performed.  Melanie Williamson 03/15/2014, 12:22 PM

## 2014-02-28 NOTE — Code Documentation (Signed)
Continue cold saline at this time per Dr. Fonnie Jarvis

## 2014-02-28 NOTE — Progress Notes (Signed)
INITIAL NUTRITION ASSESSMENT  DOCUMENTATION CODES Per approved criteria  -Not Applicable   INTERVENTION: Once pt is rewarmed recommend initiate Vital AF 1.2 @ 20 ml/hr via OG tube and increase by 10 ml every 4 hours to goal rate of 50 ml/hr.   Tube feeding regimen provides 1440 kcal (102% of needs), 90 grams of protein, and 972 ml of H2O.   NUTRITION DIAGNOSIS: Inadequate oral intake related to inability to eat as evidenced by NPO status  Goal: Pt to meet >/= 90% of their estimated nutrition needs   Monitor:  TF initiation/tolerance, labs  Reason for Assessment: Ventilator   73 y.o. female  Admitting Dx: <principal problem not specified>  ASSESSMENT: Patient is currently intubated on ventilator support after being admitted for witnessed cardiac arrest. Pt with hx of COPD. Pt now on arctic sun protocol.  MV: 10.3 L/min Temp (24hrs), Avg:93.5 F (34.2 C), Min:89.1 F (31.7 C), Max:98.5 F (36.9 C) Use normal temp for estimating needs: 37 Nutrition-focused physical exam WDL.  Propofol: none  Pt has OG tube. Labs:  Potassium low on replacement cbg's 225-374  Height: Ht Readings from Last 1 Encounters:  02/26/2014 5\' 3"  (1.6 m)    Weight: Wt Readings from Last 1 Encounters:  03/23/2014 151 lb 7.3 oz (68.7 kg)    Ideal Body Weight: 52.2 kg   % Ideal Body Weight: 132%  Wt Readings from Last 10 Encounters:  03/08/2014 151 lb 7.3 oz (68.7 kg)  10/22/13 147 lb (66.679 kg)  10/04/13 155 lb 1.6 oz (70.353 kg)  07/17/11 120 lb (54.432 kg)    Usual Body Weight: 147-155 lb   % Usual Body Weight: 100%  BMI:  Body mass index is 26.84 kg/(m^2).  Estimated Nutritional Needs: Kcal: 1405 Protein: >/ = 85 grams Fluid: >1.5 L/day  Skin: large skin tear to right arm; left arm skin tear  Diet Order: NPO  EDUCATION NEEDS: -No education needs identified at this time   Intake/Output Summary (Last 24 hours) at 03/05/2014 1157 Last data filed at 03/19/2014 1130  Gross per  24 hour  Intake 641.71 ml  Output    450 ml  Net 191.71 ml    Last BM: none documented   Labs:   Recent Labs Lab 03/12/2014 0050 03/02/2014 0330 02/24/2014 0554 03/03/2014 0741 03/20/2014 1011  NA 139 136* 142 137 137  K 4.9 4.3 3.1* 3.8 3.4*  CL 102 104 110 108 105  CO2 14* 17*  --   --  15*  BUN 21 23 21  24* 27*  CREATININE 1.32* 1.26* 1.10 1.30* 1.18*  CALCIUM 8.6 7.5*  --   --  7.3*  MG  --   --   --   --  1.8  GLUCOSE 215* 281* 293* 374* 317*    CBG (last 3)   Recent Labs  02/23/2014 0909 03/07/2014 1017 03/11/2014 1118  GLUCAP 313* 225* 261*    Scheduled Meds: . antiseptic oral rinse  7 mL Mouth Rinse QID  . artificial tears   Both Eyes 3 times per day  . [START ON 03/01/2014] aspirin  325 mg Oral Daily  . atorvastatin  40 mg Oral q1800  . budesonide (PULMICORT) nebulizer solution  0.25 mg Nebulization BID  . chlorhexidine  15 mL Mouth Rinse BID  . fentaNYL  100 mcg Intravenous Once  . ipratropium-albuterol  3 mL Nebulization Q4H  . levofloxacin (LEVAQUIN) IV  500 mg Intravenous Q48H  . pantoprazole (PROTONIX) IV  40 mg Intravenous Q24H  .  piperacillin-tazobactam (ZOSYN)  IV  3.375 g Intravenous 3 times per day  . potassium chloride  10 mEq Intravenous Q1 Hr x 4  . propofol      . vancomycin  1,000 mg Intravenous Q24H    Continuous Infusions: . sodium chloride 10 mL/hr at 03/09/2014 1013  . cisatracurium (NIMBEX) infusion 1 mcg/kg/min (03/06/2014 0321)  . fentaNYL infusion INTRAVENOUS 100 mcg/hr (03/13/2014 1000)  . heparin 650 Units/hr (03/11/2014 0625)  . insulin (NOVOLIN-R) infusion 2.3 Units/hr (03/05/2014 0650)  . midazolam (VERSED) infusion 4 mg/hr (03/13/2014 1000)  . norepinephrine (LEVOPHED) Adult infusion 25 mcg/min (03/09/2014 1115)  . vasopressin (PITRESSIN) infusion - *FOR SHOCK* 0.03 Units/min (03/23/2014 1136)    Past Medical History  Diagnosis Date  . Hypertension   . Hyperlipidemia 07/17/2011    History reviewed. No pertinent past surgical  history.  Kendell BaneHeather Anagabriela Jokerst RD, LDN, CNSC [phone] Pager [phone] After Hours Pager

## 2014-02-28 NOTE — Progress Notes (Signed)
Utilization review completed. Hisashi Amadon, RN, BSN. 

## 2014-02-28 NOTE — Progress Notes (Signed)
Received pt already intubated by EMS. RT placed pt on vent settings as ordered by MD. Tube is 7.0 and 23 at the lips. Placement verified by portable CXR.

## 2014-02-28 NOTE — Code Documentation (Signed)
Respiratory attempting ABG

## 2014-02-28 NOTE — Code Documentation (Signed)
Dr. Fonnie Jarvis and Critical care NP updated of pt troponin.

## 2014-02-28 NOTE — Code Documentation (Addendum)
Per Dr. Fonnie Jarvis, after consulting cardiology and critical care, artic sun will not be in place. Keep pt with 2 L cool saline.

## 2014-02-28 NOTE — Progress Notes (Signed)
ANTICOAGULATION CONSULT NOTE - Initial Consult  Pharmacy Consult for Heparin  Indication: chest pain/ACS  No Known Allergies  Patient Measurements: 66.7 kg  Vital Signs: Temp: 98.5 F (36.9 C) (08/07 0056) Temp src: Rectal (08/07 0056) BP: 133/87 mmHg (08/07 0116) Pulse Rate: 115 (08/07 0116)  Labs:  Recent Labs  02/25/2014 0050  HGB 11.7*  HCT 37.3  PLT 349  APTT 33  LABPROT 15.9*  INR 1.27    Assessment: 73 y/o F s/p PEA arrest with ROSC, currently getting cold saline, unsure if arctic sun will be continued, other labs as above.   Goal of Therapy:  Heparin level 0.3-0.7 units/ml Monitor platelets by anticoagulation protocol: Yes   Plan:  -Heparin 3000 units BOLUS -Start heparin drip at 800 units/hr, may need to decrease if arctic sun is continued -1000 HL -Daily CBC/HL -Monitor for bleeding -F/U CCM/cards plans  Melanie Williamson 02/27/2014,1:21 AM

## 2014-02-28 NOTE — Procedures (Signed)
Central Venous Catheter Insertion Procedure Note Melanie Williamson [id number] 1941/02/23  Procedure: Insertion of Central Venous Catheter Indications: Assessment of intravascular volume, Drug and/or fluid administration and Frequent blood sampling  Procedure Details Consent: Risks of procedure as well as the alternatives and risks of each were explained to the (patient/caregiver).  Consent for procedure obtained. Time Out: Verified patient identification, verified procedure, site/side was marked, verified correct patient position, special equipment/implants available, medications/allergies/relevent history reviewed, required imaging and test results available.  Performed  Maximum sterile technique was used including antiseptics, cap, gloves, gown, hand hygiene, mask and sheet. Skin prep: Chlorhexidine; local anesthetic administered The primary attempt at insertion was made to the left internal jugular vein, which was draped and prepped prior to procedure and performed under sterile protocol. I was able to access left IJ with finder needle, but was unable to advance guidewire. Attempt was aborted and CXR was obtained to r/o PTX before progressing to the RIJ.  A antimicrobial bonded/coated triple lumen catheter was placed in the right internal jugular vein using the Seldinger technique. Ultrasound guidance used.Yes.   Catheter placed to 16 cm. Blood aspirated via all 3 ports and then flushed x 3. Line sutured x 2 and dressing applied.  Evaluation Blood flow good Complications: No apparent complications Patient did tolerate procedure well. Chest X-ray ordered to verify placement.  CXR: pending.  Joneen Roach, ACNP [facility] Pulmonology/Critical Care Pager [phone] or [phone]     Oretha Milch  MD

## 2014-02-28 NOTE — Progress Notes (Signed)
Bilateral lower extremity venous duplex completed:  No obvious evidence of DVT, superficial thrombosis, or Baker's cyst.  Right common femoral vein not visualized due to bandages.

## 2014-02-28 NOTE — Progress Notes (Signed)
eLink Physician-Brief Progress Note Patient Name: ANESHIA VILAS DOB: [birth year] MRN: [id number]  Date of Service  03/01/2014   HPI/Events of Note   New patient from ED Induced hypothermia protocol after cardiac arrest Presumed CHF based on Chest X-ray resp acidosis prior to neuromuscular blockade hyperglycemic  eICU Interventions  ICU hyperglycemia protocol Repeat ABG> will follow   Intervention Category Evaluation Type: New Patient Evaluation  Hershall Benkert 03/24/2014, 3:39 AM

## 2014-02-28 NOTE — Progress Notes (Signed)
ANTIBIOTIC CONSULT NOTE - INITIAL  Pharmacy Consult for vancomycin/zosyn Indication: pneumonia possible aspiration  No Known Allergies  Patient Measurements: Height: 5\' 3"  (160 cm) Weight: 151 lb 7.3 oz (68.7 kg) IBW/kg (Calculated) : 52.4   Vital Signs: Temp: 91.2 F (32.9 C) (08/07 0900) Temp src: Core (Comment) (08/07 0700) BP: 81/24 mmHg (08/07 0900) Pulse Rate: 107 (08/07 0900) Intake/Output from previous day: 08/06 0701 - 08/07 0700 In: 81 [I.V.:81] Out: 125 [Urine:125] Intake/Output from this shift:    Labs:  Recent Labs  03/24/2014 0050 03/02/2014 0330 02/24/2014 0554 03/05/2014 0741  WBC 15.4* 18.4*  --   --   HGB 11.7* 11.7* 12.6 13.6  PLT 349 397  --   --   CREATININE 1.32* 1.26* 1.10 1.30*   Estimated Creatinine Clearance: 35.8 ml/min (by C-G formula based on Cr of 1.3). No results found for this basename: VANCOTROUGH, Leodis Binet, VANCORANDOM, GENTTROUGH, GENTPEAK, GENTRANDOM, TOBRATROUGH, TOBRAPEAK, TOBRARND, AMIKACINPEAK, AMIKACINTROU, AMIKACIN,  in the last 72 hours   Microbiology: Recent Results (from the past 720 hour(s))  MRSA PCR SCREENING     Status: None   Collection Time    03/02/2014  3:29 AM      Result Value Ref Range Status   MRSA by PCR NEGATIVE  NEGATIVE Final   Comment:            The GeneXpert MRSA Assay (FDA     approved for NASAL specimens     only), is one component of a     comprehensive MRSA colonization     surveillance program. It is not     intended to diagnose MRSA     infection nor to guide or     monitor treatment for     MRSA infections.    Medical History: Past Medical History  Diagnosis Date  . Hypertension   . Hyperlipidemia 07/17/2011    Assessment: 73 year old female s/p cardiac arrest now intubated and started on hypothermia protocol. Levaquin ordered earlier (currenlty being given by nursing) new orders received to add-on vancomycin and zosyn for broad coverage. No fevers noted prior to hypothermia protocol,  wbc trending up and is currently 18. Renal function also slightly worse this am will follow abx dosing carefully. Cultures being collected.  Goal of Therapy:  Vancomycin trough level 15-20 mcg/ml  Plan:  Measure antibiotic drug levels at steady state Follow up culture results Change levaquin to 500mg  q48 (copd exacerbation) - will consider increasing to pneumonia dosing prior to next dose Vancomycin 1g IV q24 hours Zosyn 3.375g IV q8 hours - infuse each dose over 4 hours  Sheppard Coil PharmD., BCPS Clinical Pharmacist Pager [phone] 03/15/2014 9:31 AM

## 2014-02-28 NOTE — Progress Notes (Addendum)
ANTICOAGULATION CONSULT NOTE   Pharmacy Consult for Heparin  Indication: chest pain/ACS  No Known Allergies  Patient Measurements: 66.7 kg  Vital Signs: Temp: 89.2 F (31.8 C) (08/07 1200) Temp src: Core (Comment) (08/07 1200) BP: 81/24 mmHg (08/07 0954) Pulse Rate: 82 (08/07 1200)  Labs:  Recent Labs  03/18/2014 0050 03/01/2014 0330 02/23/2014 0434 03/05/2014 0554 03/24/2014 0741 02/27/2014 1011 03/19/2014 1246  HGB 11.7* 11.7*  --  12.6 13.6  --   --   HCT 37.3 36.8  --  37.0 40.0  --   --   PLT 349 397  --   --   --   --   --   APTT 33  --   --   --   --   --   --   LABPROT 15.9*  --   --   --   --  14.3  --   INR 1.27  --   --   --   --  1.11  --   HEPARINUNFRC  --   --   --   --   --  <0.10*  --   CREATININE 1.32* 1.26*  --  1.10 1.30* 1.18* 1.13*  TROPONINI  --   --  1.35*  --   --  1.38*  --     Assessment: 73 y/o F s/p PEA arrest with ROSC, currently on hypothermia protocol. IV heparin for ACS, initial level undetectable. No bleeding issues noted, CBC appears stable. Will adjust rate and recheck level tonight.   Patient to start rewarming tomorrow morning ~0600  Goal of Therapy:  Heparin level 0.3-0.7 units/ml Monitor platelets by anticoagulation protocol: Yes   Plan:  -Increase heparin drip to 850 units/hr -2000 HL -Daily CBC/HL -Monitor for bleeding   Sheppard Coil PharmD., BCPS Clinical Pharmacist Pager [phone] 03/12/2014 1:34 PM   Addendum:  Repeat heparin level is <0.10 despite increase to 850 units/hr.  Patient remains on hypothermia protocol with plans for rewarming on 03/01/14 at 0600 AM.  No bleeding issues per RN.   Plan:  Bolus heparin at 2000 units x1 Increase heparin drip to 1100 units/hr. Heparin level at 0300AM  Link Snuffer, PharmD, BCPS Clinical Pharmacist [phone] ,9:48 PM

## 2014-02-28 NOTE — Procedures (Signed)
Supervised and present through the entire procedure.  Shandale Malak, MD Pulmonary and Critical Care Medicine [facility] Pager: [phone]  

## 2014-02-28 NOTE — Progress Notes (Signed)
TELEMETRY: Reviewed telemetry pt in NSR: Filed Vitals:   04/03/14 0700 04/03/14 0800 04/03/14 0900 04/03/14 0954  BP: 94/51 103/58 81/24 81/24   Pulse: 36 92 107   Temp: 89.1 F (31.7 C) 89.1 F (31.7 C) 91.2 F (32.9 C)   TempSrc: Core (Comment)     Resp: 19 24 24    Height:      Weight:      SpO2: 99% 99% 92%     Intake/Output Summary (Last 24 hours) at 04/03/14 0955 Last data filed at 04/03/14 0700  Gross per 24 hour  Intake  81.04 ml  Output    125 ml  Net -43.96 ml   Filed Weights   04/03/14 0143 04/03/14 0300  Weight: 150 lb (68.04 kg) 151 lb 7.3 oz (68.7 kg)    Subjective Patient unresponsive on vent.  Marland Kitchen. antiseptic oral rinse  7 mL Mouth Rinse QID  . [START ON 03/01/2014] aspirin  325 mg Oral Daily  . atorvastatin  40 mg Oral q1800  . budesonide (PULMICORT) nebulizer solution  0.25 mg Nebulization BID  . chlorhexidine  15 mL Mouth Rinse BID  . fentaNYL  100 mcg Intravenous Once  . ipratropium-albuterol  3 mL Nebulization Q4H  . levofloxacin (LEVAQUIN) IV  500 mg Intravenous Q48H  . pantoprazole (PROTONIX) IV  40 mg Intravenous Q24H  . piperacillin-tazobactam (ZOSYN)  IV  3.375 g Intravenous 3 times per day  . potassium chloride  10 mEq Intravenous Q1 Hr x 4  . potassium chloride      . propofol      . vancomycin  1,000 mg Intravenous Q24H   . sodium chloride 10 mL/hr at 04/03/14 0300  . cisatracurium (NIMBEX) infusion 1 mcg/kg/min (04/03/14 0321)  . fentaNYL infusion INTRAVENOUS 50 mcg/hr (04/03/14 0322)  . heparin 650 Units/hr (04/03/14 0625)  . insulin (NOVOLIN-R) infusion 2.3 Units/hr (04/03/14 0650)  . midazolam (VERSED) infusion    . norepinephrine (LEVOPHED) Adult infusion 40 mcg/min (04/03/14 0830)    LABS: Basic Metabolic Panel:  Recent Labs  11/91/4708/05/08 0050 04/03/14 0330 04/03/14 0554 04/03/14 0741  NA 139 136* 142 137  K 4.9 4.3 3.1* 3.8  CL 102 104 110 108  CO2 14* 17*  --   --   GLUCOSE 215* 281* 293* 374*  BUN 21 23 21  24*    CREATININE 1.32* 1.26* 1.10 1.30*  CALCIUM 8.6 7.5*  --   --    Liver Function Tests: No results found for this basename: AST, ALT, ALKPHOS, BILITOT, PROT, ALBUMIN,  in the last 72 hours No results found for this basename: LIPASE, AMYLASE,  in the last 72 hours CBC:  Recent Labs  04/03/14 0050 04/03/14 0330 04/03/14 0554 04/03/14 0741  WBC 15.4* 18.4*  --   --   HGB 11.7* 11.7* 12.6 13.6  HCT 37.3 36.8 37.0 40.0  MCV 99.5 97.4  --   --   PLT 349 397  --   --    Cardiac Enzymes:  Recent Labs  04/03/14 0434  TROPONINI 1.35*   BNP: No results found for this basename: PROBNP,  in the last 72 hours D-Dimer: No results found for this basename: DDIMER,  in the last 72 hours Hemoglobin A1C: No results found for this basename: HGBA1C,  in the last 72 hours Fasting Lipid Panel: No results found for this basename: CHOL, HDL, LDLCALC, TRIG, CHOLHDL, LDLDIRECT,  in the last 72 hours Thyroid Function Tests: No results found for this basename: TSH, T4TOTAL,  FREET3, T3FREE, THYROIDAB,  in the last 72 hours   Radiology/Studies:  Ct Head Wo Contrast  03/19/2014   CLINICAL DATA:  Rule out bleed, post resuscitation.  EXAM: CT HEAD WITHOUT CONTRAST  TECHNIQUE: Contiguous axial images were obtained from the base of the skull through the vertex without intravenous contrast.  COMPARISON:  CT of the head report September 12, 1998 though images are not available for direct comparison.  FINDINGS: The ventricles and sulci are normal for age. No intraparenchymal hemorrhage, mass effect nor midline shift. Confluent supratentorial white matter hypodensities are within normal range for patient's age and though non-specific suggest sequelae of chronic small vessel ischemic disease. No acute large vascular territory infarcts. Bilateral basal ganglia and thalamus lacunar infarcts.  No abnormal extra-axial fluid collections. Basal cisterns are patent. Severe calcific atherosclerosis of the carotid siphons and  included vertebral arteries.  No skull fracture. The included ocular globes and orbital contents are non-suspicious. Sphenoid sinus air-fluid levels, paranasal sinus mucosal thickening. Mastoid air cells are well aerated.  IMPRESSION: No evidence of intracranial hemorrhage.  Involutional changes. Severe white matter changes suggest chronic small vessel ischemic disease with bilateral thalamus and basal ganglia lacunar infarcts.   Electronically Signed   By: Awilda Metro   On: 02/27/2014 03:10   Dg Chest Port 1 View  03/01/2014   CLINICAL DATA:  Status post central line placement.  EXAM: PORTABLE CHEST - 1 VIEW  COMPARISON:  Chest x-ray from the same day at 04:37 a.m.  FINDINGS: Stable heart size and upper mediastinal contours. Unchanged diffuse pulmonary edema. No increasing pleural fluid. No appreciable pneumothorax.  New right IJ catheter, tip at the SVC. Unchanged and unremarkable positioning of endotracheal and orogastric tubes. Endotracheal tube ends between the clavicular heads and carina.  IMPRESSION: 1. New right IJ catheter, tip at the SVC.  No pneumothorax. 2. Unchanged pulmonary edema.   Electronically Signed   By: Tiburcio Pea M.D.   On: 03/13/2014 06:33   Dg Chest Port 1 View  03/21/2014   CLINICAL DATA:  Rule out pneumothorax.  History of hypertension.  EXAM: PORTABLE CHEST - 1 VIEW  COMPARISON:  Chest x-ray from the same day at 12:52 a.m.  FINDINGS: Unchanged borderline cardiomegaly. Stable aortic contours. The endotracheal tube ends between the clavicular heads and carina. The orogastric tube reaches the stomach. There is unchanged pulmonary edema. No evidence of effusion or pneumothorax.  IMPRESSION: 1. No evidence of pneumothorax. 2. Endotracheal and orogastric tubes remain in good position. 3. Unchanged pulmonary edema.   Electronically Signed   By: Tiburcio Pea M.D.   On: 02/25/2014 05:01   Dg Chest Port 1 View  03/02/2014   CLINICAL DATA:  Assess endotracheal tube.  Status post  CPR.  EXAM: PORTABLE CHEST - 1 VIEW  COMPARISON:  Chest radiograph October 03, 2013  FINDINGS: Endotracheal tube tip projects 3.5 cm above the carina. Nasogastric tube side port projects in proximal stomach, distal tip not imaged. Cardiac silhouette appears mildly enlarged. Tortuous calcified aorta. Mediastinal silhouette is nonsuspicious. Increased lung volumes may reflect underlying COPD, unchanged. Diffuse interstitial prominence with patchy central alveolar airspace opacities. No pleural effusions. No pneumothorax.  Vascular calcifications projecting in the upper extremities. Osteopenia.  IMPRESSION: Endotracheal tube tip projects 3.5 cm above the carina, nasogastric tube past the proximal stomach.  Mild cardiomegaly, interstitial and alveolar airspace opacities suggesting pulmonary edema, less likely ARDS.   Electronically Signed   By: Awilda Metro   On: 02/22/2014 01:06  Ecg: sinus tachy with LBBB.  PHYSICAL EXAM General: Well developed, well nourished. Intubated on vent. Cooling pads in place. Head: Normocephalic, atraumatic, sclera non-icteric, oropharynx is clear Neck: Negative for carotid bruits. JVD not elevated. No adenopathy Lungs: Clear anteriorly Heart: RRR S1 S2 without murmurs, rubs, or gallops.  Abdomen: Soft, non-tender, non-distended Extremities: No clubbing, cyanosis or edema. Some bruising of shin areas bilaterally. Neuro: Sedated and unresponsive on vent.  ASSESSMENT AND PLAN: 1. S/p PEA arrest probably related to respiratory failure/arrest. Patient initially hypotensive by cuff and pressors initiated. Once Aline place BP much better and pressors being weaned.   2. NSTEMI. Cycling cardiac enzymes. Chronic LBBB. Possibly secondary to arrest. Will check LV function by Echo. May need cardiac cath once rewarmed depending on neurologic recovery. On IV heparin. ASA. Statin.  3. Acute on chronic respiratory failure- post arrest.   4.   Present on Admission:  . Cardiac  arrest  Signed, Sun Wilensky [place], MDFACC 17-Mar-2014 9:55 AM

## 2014-02-28 NOTE — Progress Notes (Signed)
Chaplain responded to post-CPR and offered emotional support to family. Escorted family upstairs to 2H lobby/waiting area. Notified unit of their location. Additional family in transit. Family aware of chaplain services and availability. Please page if needed.   Maurene Capes [phone]

## 2014-02-28 NOTE — Procedures (Signed)
Arterial Catheter Insertion Procedure Note Melanie Williamson [id number] 1940/08/16  Procedure: Insertion of Arterial Catheter  Indications: Blood pressure monitoring and Frequent blood sampling  Procedure Details Consent: Risks of procedure as well as the alternatives and risks of each were explained to the (patient/caregiver).  Consent for procedure obtained. Time Out: Verified patient identification, verified procedure, site/side was marked, verified correct patient position, special equipment/implants available, medications/allergies/relevent history reviewed, required imaging and test results available.  Performed  Maximum sterile technique was used including antiseptics, cap, gloves, gown, hand hygiene, mask and sheet. Skin prep: Chlorhexidine; local anesthetic administered 20 gauge catheter was inserted into right femoral artery using the Seldinger technique.  Evaluation Blood flow good; BP tracing good. Complications: No apparent complications.   Brett Canales Cartina Brousseau ACNP [facility] PCCM Pager [phone] till 3 pm If no answer page [phone] 03/13/2014, 9:00 AM

## 2014-02-28 NOTE — H&P (Signed)
PULMONARY / CRITICAL CARE MEDICINE   Name: Melanie Williamson MRN: [id number] DOB: 01/03/1941    ADMISSION DATE:  03/12/2014 CONSULTATION DATE:  03/15/2014  REFERRING MD :  EDP  CHIEF COMPLAINT:  Cardiac Arrest  INITIAL PRESENTATION: 73 year old female with COPD (on O2 at night), HTN presented to [facility] ED 8/6 after suffering a witnessed cardiac arrest at home. She was intubated in field and ROSC was achieved with 10 mins of CPR and epi x 3. Maximum total downtime 10-18 minutes based on EMS. PCCM asked to admit.   STUDIES:  CTA chest 8/6 >>>  SIGNIFICANT EVENTS: 8/6 Resp > cardiac arrest. ICU admission.   HISTORY OF PRESENT ILLNESS:  73 year old female with PMH as below, presented to [facility] ED 8/6. Family reports she has been sick past few weeks, but is unable to describe any symptoms besides weakness. They deny that she had any cough or complaints of SOB prior to this acute incident. They describe her using her inhalers as prescribed and no recent increase in rescue inhaler use. She was at home 8/6 when she told her daughter to call [phone] because she couldn't breathe. EMS arrived 8 minutes later, and found her to be in PEA so ACLS was initiated. ROSC was achieved with 10 mins of ACLS and epi x 3. In ED is tachycardic, CXR c/w edema. PCCM asked to admit.   PAST MEDICAL HISTORY :  Past Medical History  Diagnosis Date  . Hypertension   . Hyperlipidemia 07/17/2011   History reviewed. No pertinent past surgical history. Prior to Admission medications   Medication Sig Start Date End Date Taking? Authorizing Provider  ALPRAZolam Prudy Feeler) 0.5 MG tablet Take 0.5 mg by mouth daily as needed for anxiety.  09/24/13   Historical Provider, MD  amLODipine (NORVASC) 5 MG tablet Take 1 tablet (5 mg total) by mouth daily. 10/05/13   Drema Dallas, MD  hydrochlorothiazide (MICROZIDE) 12.5 MG capsule Take 1 capsule (12.5 mg total) by mouth daily. 10/23/13   Pricilla Riffle, MD  ibuprofen (ADVIL,MOTRIN) 200 MG tablet Take 600 mg by  mouth every 6 (six) hours as needed for moderate pain.    Historical Provider, MD  lisinopril (PRINIVIL,ZESTRIL) 40 MG tablet Take 40 mg by mouth daily.      Historical Provider, MD  metoprolol succinate (TOPROL-XL) 50 MG 24 hr tablet Take 1 tablet (50 mg total) by mouth 2 (two) times daily. Take with or immediately following a meal. 10/05/13   Drema Dallas, MD  pravastatin (PRAVACHOL) 40 MG tablet Take 40 mg by mouth daily.      Historical Provider, MD  PROAIR HFA 108 (90 BASE) MCG/ACT inhaler Inhale 2 puffs into the lungs daily as needed for wheezing or shortness of breath.  09/11/13   Historical Provider, MD  traMADol (ULTRAM) 50 MG tablet Take 50-100 mg by mouth every 6 (six) hours as needed. Maximum dose= 8 tablets per day. For pain     Historical Provider, MD  verapamil (COVERA HS) 240 MG (CO) 24 hr tablet Take 240 mg by mouth at bedtime.      Historical Provider, MD   No Known Allergies  FAMILY HISTORY:  No family history on file. SOCIAL HISTORY:  reports that she has quit smoking. She has never used smokeless tobacco. She reports that she does not drink alcohol or use illicit drugs.  REVIEW OF SYSTEMS:  Unable due to encephalopathy  SUBJECTIVE:   VITAL SIGNS: Temp:  [98.5 F (  36.9 C)] 98.5 F (36.9 C) (08/07 0056) Pulse Rate:  [115-121] 115 (08/07 0104) Resp:  [16-24] 24 (08/07 0104) BP: (76-118)/(22-83) 118/83 mmHg (08/07 0104) SpO2:  [99 %-100 %] 100 % (08/07 0104) FiO2 (%):  [100 %] 100 % (08/07 0041) HEMODYNAMICS:   VENTILATOR SETTINGS: Vent Mode:  [-] PRVC FiO2 (%):  [100 %] 100 % Set Rate:  [10 bmp] 10 bmp Vt Set:  [450 mL] 450 mL PEEP:  [5 cmH20] 5 cmH20 INTAKE / OUTPUT: No intake or output data in the 24 hours ending 03/04/2014 0114  PHYSICAL EXAMINATION: General:  Elderly female on vent Neuro:  Mild agitation, dyssynchronous with vent.  HEENT:  /AT, No JVD noted Cardiovascular:  Tachy, regular rhythm.  Lungs:  Tight rhonchi, exp wheeze throughout.   Abdomen:  Soft, non-distended, rotund Musculoskeletal:  No acute deformity Skin:  Skin tear R forearm, vascular disease changes to BLE, No edema  LABS:  CBC  Recent Labs Lab 03/24/2014 0050  WBC 15.4*  HGB 11.7*  HCT 37.3  PLT 349   Coag's No results found for this basename: APTT, INR,  in the last 168 hours BMET No results found for this basename: NA, K, CL, CO2, BUN, CREATININE, GLUCOSE,  in the last 168 hours Electrolytes No results found for this basename: CALCIUM, MG, PHOS,  in the last 168 hours Sepsis Markers  Recent Labs Lab 03/10/2014 0059  LATICACIDVEN 8.15*   ABG No results found for this basename: PHART, PCO2ART, PO2ART,  in the last 168 hours Liver Enzymes No results found for this basename: AST, ALT, ALKPHOS, BILITOT, ALBUMIN,  in the last 168 hours Cardiac Enzymes No results found for this basename: TROPONINI, PROBNP,  in the last 168 hours Glucose  Recent Labs Lab 03/04/2014 0057  GLUCAP 189*    Imaging No results found.   ASSESSMENT / PLAN:  PULMONARY OETT 8/6 >>> A: Acute respiratory failure s/p PEA arrest Pulmonary Edema - likely post arrest AECOPD Neg CT angio 09/2013 P:   Full vent support Scheduled BD F/u ABG CXR in am Pulmonary hygeine Duplex BLEs to r/o DVT, doubt PE here -have alternative cause for hypoxia  CARDIOVASCULAR A:  PEA arrest with ROSC - probably primary respiratory arrest (AECOPD) Tachycardia H/o HTN NSTEMI - likely type II  P:  IVF resuscitation Initiate therapeutic hypothermia MAP goal > 6465mm/Hg Heparin gtt Trend troponin, lactic acid Insert CVL CVP monitoring  RENAL A:   Acute on CKD (baseline creat 1.15) High AG metabolic acidosis - likely lactic  P:   Follow BMP  GASTROINTESTINAL A:   GI ppx  P:   NPO Protonix  HEMATOLOGIC A:   Doubt PE VTE prophylaxis  P:  Pursue PE workup if oxygenation still issue once edema clears Doppler legs Heparin gtt in setting of elevated troponin.    INFECTIOUS A:   SIRS, no obvious source  P:   BCx2 8/6 > UC  8/6 > Sputum 8/6 > Abx: Levaquin, start date 8/6, day 0  ENDOCRINE A:   No acute issues  P:   SSI and CBG while NPO  NEUROLOGIC A:   Acute encephalopathy s/p arrest  P:   RASS goal: -1 PAD 3 for sedaion CT head  TODAY'S SUMMARY: Likely a primary respiratory arrest leading to PEA. ROSC and ETT prior to presentation. Not requiring pressors. Will initiate therapeutic hypothermia and appropriate medical sedation. F/u doppers, echo 8/7. Guarded Prognosis d/w daughter  Joneen Roachaul Hoffman, ACNP [facility] Pager [phone] or [phone]  [phone]  I have personally obtained a history, examined the patient, evaluated laboratory and imaging results, formulated the assessment and plan and placed orders. CRITICAL CARE: The patient is critically ill with multiple organ systems failure and requires high complexity decision making for assessment and support, frequent evaluation and titration of therapies, application of advanced monitoring technologies and extensive interpretation of multiple databases. Critical Care Time devoted to patient care services described in this note is 60  minutes.   Oretha Milch MD

## 2014-02-28 NOTE — Progress Notes (Signed)
eLink Physician-Brief Progress Note Patient Name: Melanie Williamson DOB: 05/19/1941 MRN: [id number]  Date of Service  02/23/2014   HPI/Events of Note     eICU Interventions  Hypokalemia, repleted    Intervention Category Minor Interventions: Electrolytes abnormality - evaluation and management  Rabia Argote 03/19/2014, 6:10 AM

## 2014-02-28 NOTE — H&P (Signed)
PULMONARY / CRITICAL CARE MEDICINE  Name: Melanie Williamson MRN: [id number] DOB: Sep 22, 1940    ADMISSION DATE:  03/24/2014 CONSULTATION DATE:  02/24/2014  REFERRING MD :  EDP  CHIEF COMPLAINT:  Cardiac arrest  INITIAL PRESENTATION: 73 yo with COPD (on nocturnal oxygen) admitted 8/6 after suffering a witnessed cardiac arrest at home. She was intubated in field and ROSC was achieved with 10 mins of CPR and epi x 3. Maximum total downtime 10-18 minutes based on EMS.  STUDIES:  8/6  CT head >>> nad  SIGNIFICANT EVENTS: 8/6  Admitted after cardiac arrest, hypothermia protocol started  INTERVAL HISTORY: Hypotensive, hypoxic   VITAL SIGNS: Temp:  [89.1 F (31.7 C)-98.5 F (36.9 C)] 89.1 F (31.7 C) (08/07 0700) Pulse Rate:  [36-129] 36 (08/07 0700) Resp:  [16-28] 19 (08/07 0700) BP: (76-164)/(22-92) 94/51 mmHg (08/07 0700) SpO2:  [92 %-100 %] 99 % (08/07 0700) FiO2 (%):  [80 %-100 %] 80 % (08/07 0531) Weight:  [68.04 kg (150 lb)-68.7 kg (151 lb 7.3 oz)] 68.7 kg (151 lb 7.3 oz) (08/07 0300)  HEMODYNAMICS: CVP:  [16 mmHg] 16 mmHg  VENTILATOR SETTINGS: Vent Mode:  [-] PRVC FiO2 (%):  [80 %-100 %] 80 % Set Rate:  [10 bmp-25 bmp] 24 bmp Vt Set:  [450 mL] 450 mL PEEP:  [5 cmH20-12 cmH20] 12 cmH20 Plateau Pressure:  [23 cmH20] 23 cmH20  INTAKE / OUTPUT: Intake/Output     08/06 0701 - 08/07 0700 08/07 0701 - 08/08 0700   I.V. (mL/kg) 81 (1.2)    Total Intake(mL/kg) 81 (1.2)    Urine (mL/kg/hr) 125    Total Output 125     Net -44            PHYSICAL EXAMINATION: General:  Appears acutely ill, mechanically ventilated, synchronous Neuro:  Encephalopathic, nonfocal, cough / gag absent HEENT:  PERRL, OETT / OGT Cardiovascular:  RRR, no m/r/g Lungs:  Bilateral diminished air entry, no wheezing at this time Abdomen:  Soft, nontender, bowel sounds diminished Musculoskeletal:  No edema Skin:  No rash  LABS:  CBC  Recent Labs Lab 03/12/2014 0050 03/14/2014 0330 03/24/2014 0554  02/24/2014 0741  WBC 15.4* 18.4*  --   --   HGB 11.7* 11.7* 12.6 13.6  HCT 37.3 36.8 37.0 40.0  PLT 349 397  --   --    Coag's  Recent Labs Lab 02/27/2014 0050  APTT 33  INR 1.27   BMET  Recent Labs Lab 03/08/2014 0050 03/08/2014 0330 03/19/2014 0554 03/11/2014 0741  NA 139 136* 142 137  K 4.9 4.3 3.1* 3.8  CL 102 104 110 108  CO2 14* 17*  --   --   BUN 21 23 21  24*  CREATININE 1.32* 1.26* 1.10 1.30*  GLUCOSE 215* 281* 293* 374*   Electrolytes  Recent Labs Lab 02/22/2014 0050 03/22/2014 0330  CALCIUM 8.6 7.5*   Sepsis Markers  Recent Labs Lab 03/19/2014 0059  LATICACIDVEN 8.15*   ABG  Recent Labs Lab 02/24/2014 0201 03/19/2014 0502  PHART 7.129* 7.218*  PCO2ART 56.7* 44.1  PO2ART 105.0* 66.0*   Liver Enzymes No results found for this basename: AST, ALT, ALKPHOS, BILITOT, ALBUMIN,  in the last 168 hours  Cardiac Enzymes  Recent Labs Lab 02/27/2014 0434  TROPONINI 1.35*   Glucose  Recent Labs Lab 03/18/2014 0057 03/11/2014 0420 02/23/2014 0542 03/15/2014 0641 03/07/2014 0737  GLUCAP 189* 241* 286* 293* 368*   IMAGING:  Ct Head Wo Contrast  03/18/2014   CLINICAL  DATA:  Rule out bleed, post resuscitation.  EXAM: CT HEAD WITHOUT CONTRAST  TECHNIQUE: Contiguous axial images were obtained from the base of the skull through the vertex without intravenous contrast.  COMPARISON:  CT of the head report September 12, 1998 though images are not available for direct comparison.  FINDINGS: The ventricles and sulci are normal for age. No intraparenchymal hemorrhage, mass effect nor midline shift. Confluent supratentorial white matter hypodensities are within normal range for patient's age and though non-specific suggest sequelae of chronic small vessel ischemic disease. No acute large vascular territory infarcts. Bilateral basal ganglia and thalamus lacunar infarcts.  No abnormal extra-axial fluid collections. Basal cisterns are patent. Severe calcific atherosclerosis of the carotid  siphons and included vertebral arteries.  No skull fracture. The included ocular globes and orbital contents are non-suspicious. Sphenoid sinus air-fluid levels, paranasal sinus mucosal thickening. Mastoid air cells are well aerated.  IMPRESSION: No evidence of intracranial hemorrhage.  Involutional changes. Severe white matter changes suggest chronic small vessel ischemic disease with bilateral thalamus and basal ganglia lacunar infarcts.   Electronically Signed   By: Awilda Metroourtnay  Bloomer   On: 09/08/13 03:10   Dg Chest Port 1 View  03/19/2014   CLINICAL DATA:  Status post central line placement.  EXAM: PORTABLE CHEST - 1 VIEW  COMPARISON:  Chest x-ray from the same day at 04:37 a.m.  FINDINGS: Stable heart size and upper mediastinal contours. Unchanged diffuse pulmonary edema. No increasing pleural fluid. No appreciable pneumothorax.  New right IJ catheter, tip at the SVC. Unchanged and unremarkable positioning of endotracheal and orogastric tubes. Endotracheal tube ends between the clavicular heads and carina.  IMPRESSION: 1. New right IJ catheter, tip at the SVC.  No pneumothorax. 2. Unchanged pulmonary edema.   Electronically Signed   By: Tiburcio PeaJonathan  Watts M.D.   On: 09/08/13 06:33   Dg Chest Port 1 View  03/22/2014   CLINICAL DATA:  Rule out pneumothorax.  History of hypertension.  EXAM: PORTABLE CHEST - 1 VIEW  COMPARISON:  Chest x-ray from the same day at 12:52 a.m.  FINDINGS: Unchanged borderline cardiomegaly. Stable aortic contours. The endotracheal tube ends between the clavicular heads and carina. The orogastric tube reaches the stomach. There is unchanged pulmonary edema. No evidence of effusion or pneumothorax.  IMPRESSION: 1. No evidence of pneumothorax. 2. Endotracheal and orogastric tubes remain in good position. 3. Unchanged pulmonary edema.   Electronically Signed   By: Tiburcio PeaJonathan  Watts M.D.   On: 09/08/13 05:01   Dg Chest Port 1 View  03/21/2014   CLINICAL DATA:  Assess endotracheal tube.   Status post CPR.  EXAM: PORTABLE CHEST - 1 VIEW  COMPARISON:  Chest radiograph October 03, 2013  FINDINGS: Endotracheal tube tip projects 3.5 cm above the carina. Nasogastric tube side port projects in proximal stomach, distal tip not imaged. Cardiac silhouette appears mildly enlarged. Tortuous calcified aorta. Mediastinal silhouette is nonsuspicious. Increased lung volumes may reflect underlying COPD, unchanged. Diffuse interstitial prominence with patchy central alveolar airspace opacities. No pleural effusions. No pneumothorax.  Vascular calcifications projecting in the upper extremities. Osteopenia.  IMPRESSION: Endotracheal tube tip projects 3.5 cm above the carina, nasogastric tube past the proximal stomach.  Mild cardiomegaly, interstitial and alveolar airspace opacities suggesting pulmonary edema, less likely ARDS.   Electronically Signed   By: Awilda Metroourtnay  Bloomer   On: 09/08/13 01:06   ASSESSMENT / PLAN:  PULMONARY A: Acute respiratory failure, intubated 8/6 Pulmonary edema Possible aspiration pneumonia AECOPD, no acute bronchospasm P:  Goal pH>7.30, SpO2>92 Continuous mechanical support Decrease PEEP 8 as shock VAP bundle Defer SBT Trend ABG/CXR; ABG now - suspect acidosis may be driving hypotension Albuterol / Atrovent / Pulmicort  Defer systemic steroids  CARDIOVASCULAR A:  PEA arrest, likely secondary to respiratory compromise NSTEMI Shock ( septic vs cardiogenic ) P:  Goal MAP>65 Levophed gtt Add ASA, Lipitor BB / ACEI contraindicated - shock Trend troponin, lactic acid Place AL TTE  RENAL A:   AKI P:   Trend BMP  GASTROINTESTINAL A:   Nutrition GIPx P:   NPO Protonix TF if not extubated after Hypothermia Protocol done  HEMATOLOGIC A:   VTE Px Therapeutic heparinization for ACS P:  Trend CBC Heparin per pharmacy  INFECTIOUS A:   Possible aspiration pneumonia P:   PCT Blood cx 8/6 Levaquin 8/6 Add Zosyn 8/7  Add Vancomycin  8/7  ENDOCRINE A:   Hyperglycemia P:   Insulin gtt  NEUROLOGIC A:   Acute encephalopathy Possible anoxia P:   RASS goal -4 to -5 D/c Propofol as can be contributing to hypotension Fentanyl gtt Add Versed gtt Hypothermia  I have personally obtained history, examined patient, evaluated and interpreted laboratory and imaging results, reviewed medical records, formulated assessment / plan and placed orders.  CRITICAL CARE:  The patient is critically ill with multiple organ systems failure and requires high complexity decision making for assessment and support, frequent evaluation and titration of therapies, application of advanced monitoring technologies and extensive interpretation of multiple databases. Additional Critical Care Time devoted to patient care services described in this note is 30 minutes.   Lonia Farber, MD Pulmonary and Critical Care Medicine [facility] Pager: [phone]  02/23/2014, 9:01 AM

## 2014-02-28 NOTE — ED Provider Notes (Signed)
CSN: [id number]     Arrival date & time 28-Mar-2014  0037 History   First MD Initiated Contact with Patient 03-28-2014 [phone]     Chief Complaint  Patient presents with  . Cardiac Arrest     (Consider location/radiation/quality/duration/timing/severity/associated sxs/prior Treatment) HPI 73 year old female with history of COPD called out for help to her daughter who she lives with this evening and patient told the daughter to get her to the hospital; the daughter called 911 and EMS found the patient pulseless with agonal respiratory effort with PEA initiated compressions intubated patient placed to 3 doses of epinephrine chest compressions 10 minutes patient had return of spontaneous circulation remains GCS 3 Unknown pulseless downtime prior to CPR.  Rhonchi bilaterally GCS 3   PCCM recs no Arctic Sun pads [id number]  Past Medical History  Diagnosis Date  . Hypertension   . Hyperlipidemia 07/17/2011   COPD History reviewed. No pertinent past surgical history. No family history on file. History  Substance Use Topics  . Smoking status: Former Games developer  . Smokeless tobacco: Never Used  . Alcohol Use: No   OB History   Grav Para Term Preterm Abortions TAB SAB Ect Mult Living                 Review of Systems  Unable to perform ROS: Patient unresponsive      Allergies  Review of patient's allergies indicates no known allergies.  Home Medications   Prior to Admission medications   Medication Sig Start Date End Date Taking? Authorizing Provider  acetaminophen (TYLENOL) 650 MG CR tablet Take 650 mg by mouth every 8 (eight) hours as needed for pain.   Yes Historical Provider, MD  ALPRAZolam Prudy Feeler) 0.5 MG tablet Take 0.5 mg by mouth daily as needed for anxiety.  09/24/13  Yes Historical Provider, MD  amLODipine (NORVASC) 5 MG tablet Take 1 tablet (5 mg total) by mouth daily. 10/05/13  Yes Drema Dallas, MD  budesonide-formoterol [facility]) 160-4.5 MCG/ACT inhaler Inhale 2 puffs into the  lungs 2 (two) times daily.   Yes Historical Provider, MD  Caffeine (VIVARIN PO) Take 1 tablet by mouth as directed.   Yes Historical Provider, MD  diclofenac (CATAFLAM) 50 MG tablet Take 50 mg by mouth 2 (two) times daily.   Yes Historical Provider, MD  Homeopathic Products [facility] COLD REMEDY PO) Take 1 tablet by mouth every 3 (three) hours.   Yes Historical Provider, MD  lisinopril (PRINIVIL,ZESTRIL) 20 MG tablet Take 20 mg by mouth daily.   Yes Historical Provider, MD  metoprolol succinate (TOPROL-XL) 50 MG 24 hr tablet Take 25 mg by mouth 2 (two) times daily. Take with or immediately following a meal.   Yes Historical Provider, MD  pravastatin (PRAVACHOL) 40 MG tablet Take 40 mg by mouth daily.     Yes Historical Provider, MD  PROAIR HFA 108 (90 BASE) MCG/ACT inhaler Inhale 2 puffs into the lungs daily as needed for wheezing or shortness of breath.  09/11/13  Yes Historical Provider, MD  traMADol (ULTRAM) 50 MG tablet Take 50-100 mg by mouth 3 (three) times daily as needed for moderate pain. Maximum dose= 8 tablets per day. For pain   Yes Historical Provider, MD   BP 113/65  Pulse 102  Temp(Src) 95.5 F (35.3 C) (Oral)  Resp 24  Ht 5\' 3"  (1.6 m)  Wt 152 lb 1.9 oz (69 kg)  BMI 26.95 kg/m2  SpO2 98% Physical Exam  Nursing note and vitals reviewed. Constitutional:  GCS 3  HENT:  Head: Atraumatic.  Intubated by EMS PTA  Eyes: Right eye exhibits no discharge. Left eye exhibits no discharge.  Pupils nonreactive  Neck: Neck supple.  Cardiovascular: Normal rate and regular rhythm.   No murmur heard. Pulmonary/Chest: She is in respiratory distress. She has no wheezes. She has no rales. She exhibits no tenderness.  Intubated rhonchi and wheezes bilat; pulse ox 90's adequate O2 sat level while intubated  Abdominal: Soft. There is no tenderness. There is no rebound.  Musculoskeletal: She exhibits no tenderness.  Fem pulses intact  Neurological:  GCS 3  Skin: No rash noted.    ED Course   Procedures (including critical care time) Labs Review Labs Reviewed  CBC - Abnormal; Notable for the following:    WBC 15.4 (*)    RBC 3.75 (*)    Hemoglobin 11.7 (*)    All other components within normal limits  BASIC METABOLIC PANEL - Abnormal; Notable for the following:    CO2 14 (*)    Glucose, Bld 215 (*)    Creatinine, Ser 1.32 (*)    GFR calc non Af Amer 39 (*)    GFR calc Af Amer 45 (*)    Anion gap 23 (*)    All other components within normal limits  PROTIME-INR - Abnormal; Notable for the following:    Prothrombin Time 15.9 (*)    All other components within normal limits  URINALYSIS, ROUTINE W REFLEX MICROSCOPIC - Abnormal; Notable for the following:    APPearance CLOUDY (*)    All other components within normal limits  BASIC METABOLIC PANEL - Abnormal; Notable for the following:    Sodium 136 (*)    CO2 17 (*)    Glucose, Bld 281 (*)    Creatinine, Ser 1.26 (*)    Calcium 7.5 (*)    GFR calc non Af Amer 41 (*)    GFR calc Af Amer 48 (*)    All other components within normal limits  BASIC METABOLIC PANEL - Abnormal; Notable for the following:    Potassium 3.4 (*)    CO2 15 (*)    Glucose, Bld 317 (*)    BUN 27 (*)    Creatinine, Ser 1.18 (*)    Calcium 7.3 (*)    GFR calc non Af Amer 45 (*)    GFR calc Af Amer 52 (*)    Anion gap 17 (*)    All other components within normal limits  BASIC METABOLIC PANEL - Abnormal; Notable for the following:    Potassium 3.6 (*)    CO2 14 (*)    Glucose, Bld 285 (*)    BUN 26 (*)    Creatinine, Ser 1.13 (*)    Calcium 7.6 (*)    GFR calc non Af Amer 47 (*)    GFR calc Af Amer 54 (*)    Anion gap 18 (*)    All other components within normal limits  BASIC METABOLIC PANEL - Abnormal; Notable for the following:    Potassium 2.7 (*)    Chloride 116 (*)    CO2 11 (*)    Glucose, Bld 138 (*)    Calcium 5.2 (*)    GFR calc non Af Amer 65 (*)    GFR calc Af Amer 76 (*)    All other components within normal limits  CBC  - Abnormal; Notable for the following:    WBC 18.4 (*)    RBC 3.78 (*)  Hemoglobin 11.7 (*)    All other components within normal limits  TROPONIN I - Abnormal; Notable for the following:    Troponin I 1.35 (*)    All other components within normal limits  GLUCOSE, CAPILLARY - Abnormal; Notable for the following:    Glucose-Capillary 241 (*)    All other components within normal limits  GLUCOSE, CAPILLARY - Abnormal; Notable for the following:    Glucose-Capillary 286 (*)    All other components within normal limits  GLUCOSE, CAPILLARY - Abnormal; Notable for the following:    Glucose-Capillary 293 (*)    All other components within normal limits  GLUCOSE, CAPILLARY - Abnormal; Notable for the following:    Glucose-Capillary 368 (*)    All other components within normal limits  HEPARIN LEVEL (UNFRACTIONATED) - Abnormal; Notable for the following:    Heparin Unfractionated <0.10 (*)    All other components within normal limits  TROPONIN I - Abnormal; Notable for the following:    Troponin I 1.38 (*)    All other components within normal limits  LACTIC ACID, PLASMA - Abnormal; Notable for the following:    Lactic Acid, Venous 3.5 (*)    All other components within normal limits  BLOOD GAS, ARTERIAL - Abnormal; Notable for the following:    pH, Arterial 7.219 (*)    pO2, Arterial 183.0 (*)    Bicarbonate 15.7 (*)    Acid-base deficit 11.2 (*)    All other components within normal limits  GLUCOSE, CAPILLARY - Abnormal; Notable for the following:    Glucose-Capillary 313 (*)    All other components within normal limits  GLUCOSE, CAPILLARY - Abnormal; Notable for the following:    Glucose-Capillary 225 (*)    All other components within normal limits  GLUCOSE, CAPILLARY - Abnormal; Notable for the following:    Glucose-Capillary 261 (*)    All other components within normal limits  LACTIC ACID, PLASMA - Abnormal; Notable for the following:    Lactic Acid, Venous 3.0 (*)     All other components within normal limits  LACTIC ACID, PLASMA - Abnormal; Notable for the following:    Lactic Acid, Venous 3.3 (*)    All other components within normal limits  TROPONIN I - Abnormal; Notable for the following:    Troponin I 1.15 (*)    All other components within normal limits  TROPONIN I - Abnormal; Notable for the following:    Troponin I 1.42 (*)    All other components within normal limits  GLUCOSE, CAPILLARY - Abnormal; Notable for the following:    Glucose-Capillary 268 (*)    All other components within normal limits  GLUCOSE, CAPILLARY - Abnormal; Notable for the following:    Glucose-Capillary 252 (*)    All other components within normal limits  HEPARIN LEVEL (UNFRACTIONATED) - Abnormal; Notable for the following:    Heparin Unfractionated <0.10 (*)    All other components within normal limits  GLUCOSE, CAPILLARY - Abnormal; Notable for the following:    Glucose-Capillary 224 (*)    All other components within normal limits  LACTIC ACID, PLASMA - Abnormal; Notable for the following:    Lactic Acid, Venous 2.8 (*)    All other components within normal limits  TROPONIN I - Abnormal; Notable for the following:    Troponin I 1.40 (*)    All other components within normal limits  GLUCOSE, CAPILLARY - Abnormal; Notable for the following:    Glucose-Capillary 206 (*)  All other components within normal limits  GLUCOSE, CAPILLARY - Abnormal; Notable for the following:    Glucose-Capillary 122 (*)    All other components within normal limits  GLUCOSE, CAPILLARY - Abnormal; Notable for the following:    Glucose-Capillary 169 (*)    All other components within normal limits  GLUCOSE, CAPILLARY - Abnormal; Notable for the following:    Glucose-Capillary 162 (*)    All other components within normal limits  GLUCOSE, CAPILLARY - Abnormal; Notable for the following:    Glucose-Capillary 144 (*)    All other components within normal limits  GLUCOSE, CAPILLARY  - Abnormal; Notable for the following:    Glucose-Capillary 124 (*)    All other components within normal limits  GLUCOSE, CAPILLARY - Abnormal; Notable for the following:    Glucose-Capillary 120 (*)    All other components within normal limits  HEPARIN LEVEL (UNFRACTIONATED) - Abnormal; Notable for the following:    Heparin Unfractionated 1.80 (*)    All other components within normal limits  CBC - Abnormal; Notable for the following:    WBC 17.2 (*)    RBC 3.70 (*)    Hemoglobin 11.4 (*)    HCT 34.1 (*)    All other components within normal limits  BASIC METABOLIC PANEL - Abnormal; Notable for the following:    Sodium 136 (*)    CO2 13 (*)    Glucose, Bld 133 (*)    BUN 28 (*)    Creatinine, Ser 1.25 (*)    Calcium 7.8 (*)    GFR calc non Af Amer 42 (*)    GFR calc Af Amer 48 (*)    Anion gap 20 (*)    All other components within normal limits  TROPONIN I - Abnormal; Notable for the following:    Troponin I 1.41 (*)    All other components within normal limits  GLUCOSE, CAPILLARY - Abnormal; Notable for the following:    Glucose-Capillary 102 (*)    All other components within normal limits  BASIC METABOLIC PANEL - Abnormal; Notable for the following:    Sodium 135 (*)    CO2 15 (*)    BUN 28 (*)    Creatinine, Ser 1.24 (*)    Calcium 7.5 (*)    GFR calc non Af Amer 42 (*)    GFR calc Af Amer 49 (*)    Anion gap 18 (*)    All other components within normal limits  GLUCOSE, CAPILLARY - Abnormal; Notable for the following:    Glucose-Capillary 156 (*)    All other components within normal limits  BASIC METABOLIC PANEL - Abnormal; Notable for the following:    Sodium 134 (*)    CO2 14 (*)    Glucose, Bld 170 (*)    BUN 28 (*)    Creatinine, Ser 1.31 (*)    Calcium 7.5 (*)    GFR calc non Af Amer 39 (*)    GFR calc Af Amer 46 (*)    Anion gap 20 (*)    All other components within normal limits  CARBOXYHEMOGLOBIN - Abnormal; Notable for the following:    Total  hemoglobin 10.7 (*)    All other components within normal limits  CBG MONITORING, ED - Abnormal; Notable for the following:    Glucose-Capillary 189 (*)    All other components within normal limits  I-STAT CG4 LACTIC ACID, ED - Abnormal; Notable for the following:    Lactic Acid,  Venous 8.15 (*)    All other components within normal limits  I-STAT TROPOININ, ED - Abnormal; Notable for the following:    Troponin i, poc 2.97 (*)    All other components within normal limits  I-STAT ARTERIAL BLOOD GAS, ED - Abnormal; Notable for the following:    pH, Arterial 7.129 (*)    pCO2 arterial 56.7 (*)    pO2, Arterial 105.0 (*)    Bicarbonate 18.8 (*)    Acid-base deficit 11.0 (*)    All other components within normal limits  POCT I-STAT 3, ART BLOOD GAS (G3+) - Abnormal; Notable for the following:    pH, Arterial 7.218 (*)    pO2, Arterial 66.0 (*)    Bicarbonate 18.2 (*)    Acid-base deficit 10.0 (*)    All other components within normal limits  POCT I-STAT, CHEM 8 - Abnormal; Notable for the following:    Potassium 3.1 (*)    Glucose, Bld 293 (*)    Calcium, Ion 1.08 (*)    All other components within normal limits  POCT I-STAT, CHEM 8 - Abnormal; Notable for the following:    BUN 24 (*)    Creatinine, Ser 1.30 (*)    Glucose, Bld 374 (*)    Calcium, Ion 1.11 (*)    All other components within normal limits  POCT I-STAT, CHEM 8 - Abnormal; Notable for the following:    Sodium 134 (*)    BUN 26 (*)    Creatinine, Ser 1.40 (*)    Glucose, Bld 172 (*)    Calcium, Ion 0.96 (*)    Hemoglobin 11.6 (*)    HCT 34.0 (*)    All other components within normal limits  POCT I-STAT 3, ART BLOOD GAS (G3+) - Abnormal; Notable for the following:    pCO2 arterial 25.3 (*)    pO2, Arterial 108.0 (*)    Bicarbonate 16.3 (*)    Acid-base deficit 8.0 (*)    All other components within normal limits  POCT I-STAT, CHEM 8 - Abnormal; Notable for the following:    Sodium 135 (*)    BUN 27 (*)     Creatinine, Ser 1.40 (*)    Glucose, Bld 117 (*)    Calcium, Ion 1.01 (*)    Hemoglobin 11.6 (*)    HCT 34.0 (*)    All other components within normal limits  CULTURE, BLOOD (ROUTINE X 2)  CULTURE, BLOOD (ROUTINE X 2)  MRSA PCR SCREENING  URINE CULTURE  CULTURE, RESPIRATORY (NON-EXPECTORATED)  APTT  MAGNESIUM  PROCALCITONIN  PROTIME-INR  PROCALCITONIN  GLUCOSE, CAPILLARY  GLUCOSE, CAPILLARY  BLOOD GAS, ARTERIAL  BASIC METABOLIC PANEL  BASIC METABOLIC PANEL  BASIC METABOLIC PANEL  HEPARIN LEVEL (UNFRACTIONATED)  BLOOD GAS, ARTERIAL  BASIC METABOLIC PANEL    Imaging Review Ct Head Wo Contrast  30-Mar-2014   CLINICAL DATA:  Rule out bleed, post resuscitation.  EXAM: CT HEAD WITHOUT CONTRAST  TECHNIQUE: Contiguous axial images were obtained from the base of the skull through the vertex without intravenous contrast.  COMPARISON:  CT of the head report September 12, 1998 though images are not available for direct comparison.  FINDINGS: The ventricles and sulci are normal for age. No intraparenchymal hemorrhage, mass effect nor midline shift. Confluent supratentorial white matter hypodensities are within normal range for patient's age and though non-specific suggest sequelae of chronic small vessel ischemic disease. No acute large vascular territory infarcts. Bilateral basal ganglia and thalamus lacunar infarcts.  No  abnormal extra-axial fluid collections. Basal cisterns are patent. Severe calcific atherosclerosis of the carotid siphons and included vertebral arteries.  No skull fracture. The included ocular globes and orbital contents are non-suspicious. Sphenoid sinus air-fluid levels, paranasal sinus mucosal thickening. Mastoid air cells are well aerated.  IMPRESSION: No evidence of intracranial hemorrhage.  Involutional changes. Severe white matter changes suggest chronic small vessel ischemic disease with bilateral thalamus and basal ganglia lacunar infarcts.   Electronically Signed   By:  Awilda Metro   On: 03/04/2014 03:10   Dg Chest Port 1 View  02/27/2014   CLINICAL DATA:  Status post central line placement.  EXAM: PORTABLE CHEST - 1 VIEW  COMPARISON:  Chest x-ray from the same day at 04:37 a.m.  FINDINGS: Stable heart size and upper mediastinal contours. Unchanged diffuse pulmonary edema. No increasing pleural fluid. No appreciable pneumothorax.  New right IJ catheter, tip at the SVC. Unchanged and unremarkable positioning of endotracheal and orogastric tubes. Endotracheal tube ends between the clavicular heads and carina.  IMPRESSION: 1. New right IJ catheter, tip at the SVC.  No pneumothorax. 2. Unchanged pulmonary edema.   Electronically Signed   By: Tiburcio Pea M.D.   On: 03/12/2014 06:33   Dg Chest Port 1 View  02/22/2014   CLINICAL DATA:  Rule out pneumothorax.  History of hypertension.  EXAM: PORTABLE CHEST - 1 VIEW  COMPARISON:  Chest x-ray from the same day at 12:52 a.m.  FINDINGS: Unchanged borderline cardiomegaly. Stable aortic contours. The endotracheal tube ends between the clavicular heads and carina. The orogastric tube reaches the stomach. There is unchanged pulmonary edema. No evidence of effusion or pneumothorax.  IMPRESSION: 1. No evidence of pneumothorax. 2. Endotracheal and orogastric tubes remain in good position. 3. Unchanged pulmonary edema.   Electronically Signed   By: Tiburcio Pea M.D.   On: 03/22/2014 05:01   Dg Chest Port 1 View  03/09/2014   CLINICAL DATA:  Assess endotracheal tube.  Status post CPR.  EXAM: PORTABLE CHEST - 1 VIEW  COMPARISON:  Chest radiograph October 03, 2013  FINDINGS: Endotracheal tube tip projects 3.5 cm above the carina. Nasogastric tube side port projects in proximal stomach, distal tip not imaged. Cardiac silhouette appears mildly enlarged. Tortuous calcified aorta. Mediastinal silhouette is nonsuspicious. Increased lung volumes may reflect underlying COPD, unchanged. Diffuse interstitial prominence with patchy central  alveolar airspace opacities. No pleural effusions. No pneumothorax.  Vascular calcifications projecting in the upper extremities. Osteopenia.  IMPRESSION: Endotracheal tube tip projects 3.5 cm above the carina, nasogastric tube past the proximal stomach.  Mild cardiomegaly, interstitial and alveolar airspace opacities suggesting pulmonary edema, less likely ARDS.   Electronically Signed   By: Awilda Metro   On: 03/20/2014 01:06     EKG Interpretation None     Muse not working ECG: Sinus tachycardia, ventricular rate 122, left bundle branch block, no significant change noted compared with prior ECG MDM   Final diagnoses:  Cardiac arrest  NSTEMI (non-ST elevated myocardial infarction)  COPD with acute exacerbation  Glasgow coma scale total score 3-8    D/w PCCM and Cards no [employer] or [employer]. The patient appears reasonably stabilized for admission considering the current resources, flow, and capabilities available in the ED at this time, and I doubt any other [facility] requiring further screening and/or treatment in the ED prior to admission.  CRITICAL CARE Performed by: Hurman Horn Total critical care time: Critical care time was exclusive of separately billable procedures and  treating other patients. Critical care was necessary to treat or prevent imminent or life-threatening deterioration. Critical care was time spent personally by me on the following activities: development of treatment plan with patient and/or surrogate as well as nursing, discussions with consultants, evaluation of patient's response to treatment, examination of patient, obtaining history from patient or surrogate, ordering and performing treatments and interventions, ordering and review of laboratory studies, ordering and review of radiographic studies, pulse oximetry and re-evaluation of patient's condition.    Hurman Horn, MD 03/01/14 [phone]

## 2014-02-28 NOTE — Code Documentation (Signed)
Pt to CT

## 2014-02-28 NOTE — Code Documentation (Signed)
Critical care and cardiology at bedside.

## 2014-03-01 DIAGNOSIS — J9620 Acute and chronic respiratory failure, unspecified whether with hypoxia or hypercapnia: Secondary | ICD-10-CM

## 2014-03-01 DIAGNOSIS — I214 Non-ST elevation (NSTEMI) myocardial infarction: Secondary | ICD-10-CM

## 2014-03-01 LAB — BASIC METABOLIC PANEL
Anion gap: 18 — ABNORMAL HIGH (ref 5–15)
Anion gap: 20 — ABNORMAL HIGH (ref 5–15)
Anion gap: 17 — ABNORMAL HIGH (ref 5–15)
Anion gap: 18 — ABNORMAL HIGH (ref 5–15)
BUN: 28 mg/dL — AB (ref 6–23)
BUN: 28 mg/dL — AB (ref 6–23)
BUN: 29 mg/dL — AB (ref 6–23)
BUN: 29 mg/dL — ABNORMAL HIGH (ref 6–23)
CALCIUM: 7.1 mg/dL — AB (ref 8.4–10.5)
CALCIUM: 7.5 mg/dL — AB (ref 8.4–10.5)
CALCIUM: 7.5 mg/dL — AB (ref 8.4–10.5)
CO2: 15 mEq/L — ABNORMAL LOW (ref 19–32)
CO2: 14 meq/L — AB (ref 19–32)
CO2: 16 meq/L — AB (ref 19–32)
CO2: 16 meq/L — AB (ref 19–32)
CREATININE: 1.31 mg/dL — AB (ref 0.50–1.10)
CREATININE: 1.38 mg/dL — AB (ref 0.50–1.10)
CREATININE: 1.55 mg/dL — AB (ref 0.50–1.10)
Calcium: 7.2 mg/dL — ABNORMAL LOW (ref 8.4–10.5)
Chloride: 102 mEq/L (ref 96–112)
Chloride: 100 mEq/L (ref 96–112)
Chloride: 99 mEq/L (ref 96–112)
Chloride: 99 mEq/L (ref 96–112)
Creatinine, Ser: 1.24 mg/dL — ABNORMAL HIGH (ref 0.50–1.10)
GFR calc Af Amer: 46 mL/min — ABNORMAL LOW (ref >90)
GFR calc Af Amer: 43 mL/min — ABNORMAL LOW (ref >90)
GFR calc Af Amer: 37 mL/min — ABNORMAL LOW (ref >90)
GFR calc non Af Amer: 39 mL/min — ABNORMAL LOW (ref >90)
GFR calc non Af Amer: 32 mL/min — ABNORMAL LOW (ref >90)
GFR, EST AFRICAN AMERICAN: 49 mL/min — AB (ref >90)
GFR, EST NON AFRICAN AMERICAN: 37 mL/min — AB (ref >90)
GFR, EST NON AFRICAN AMERICAN: 42 mL/min — AB (ref >90)
GLUCOSE: 118 mg/dL — AB (ref 70–99)
GLUCOSE: 86 mg/dL (ref 70–99)
Glucose, Bld: 95 mg/dL (ref 70–99)
Glucose, Bld: 170 mg/dL — ABNORMAL HIGH (ref 70–99)
POTASSIUM: 4.1 meq/L (ref 3.7–5.3)
Potassium: 4.4 mEq/L (ref 3.7–5.3)
Potassium: 4.9 mEq/L (ref 3.7–5.3)
Potassium: 5.9 mEq/L — ABNORMAL HIGH (ref 3.7–5.3)
SODIUM: 134 meq/L — AB (ref 137–147)
SODIUM: 132 meq/L — AB (ref 137–147)
Sodium: 135 mEq/L — ABNORMAL LOW (ref 137–147)
Sodium: 133 mEq/L — ABNORMAL LOW (ref 137–147)

## 2014-03-01 LAB — CBC
HEMATOCRIT: 34.1 % — AB (ref 36.0–46.0)
Hemoglobin: 11.4 g/dL — ABNORMAL LOW (ref 12.0–15.0)
MCH: 30.8 pg (ref 26.0–34.0)
MCHC: 33.4 g/dL (ref 30.0–36.0)
MCV: 92.2 fL (ref 78.0–100.0)
Platelets: 366 10*3/uL (ref 150–400)
RBC: 3.7 MIL/uL — AB (ref 3.87–5.11)
RDW: 12.6 % (ref 11.5–15.5)
WBC: 17.2 10*3/uL — AB (ref 4.0–10.5)

## 2014-03-01 LAB — POCT I-STAT 3, ART BLOOD GAS (G3+)
ACID-BASE DEFICIT: 8 mmol/L — AB (ref 0.0–2.0)
Bicarbonate: 16.3 mEq/L — ABNORMAL LOW (ref 20.0–24.0)
O2 Saturation: 99 %
Patient temperature: 93
TCO2: 17 mmol/L (ref 0–100)
pCO2 arterial: 25.3 mmHg — ABNORMAL LOW (ref 35.0–45.0)
pH, Arterial: 7.402 (ref 7.350–7.450)
pO2, Arterial: 108 mmHg — ABNORMAL HIGH (ref 80.0–100.0)

## 2014-03-01 LAB — CARBOXYHEMOGLOBIN
Carboxyhemoglobin: 1 % (ref 0.5–1.5)
Methemoglobin: 0.7 % (ref 0.0–1.5)
O2 Saturation: 87.5 %
Total hemoglobin: 10.7 g/dL — ABNORMAL LOW (ref 12.0–16.0)

## 2014-03-01 LAB — POCT I-STAT, CHEM 8
BUN: 26 mg/dL — AB (ref 6–23)
BUN: 27 mg/dL — ABNORMAL HIGH (ref 6–23)
CALCIUM ION: 0.96 mmol/L — AB (ref 1.13–1.30)
CHLORIDE: 104 meq/L (ref 96–112)
CHLORIDE: 104 meq/L (ref 96–112)
Calcium, Ion: 1.01 mmol/L — ABNORMAL LOW (ref 1.13–1.30)
Creatinine, Ser: 1.4 mg/dL — ABNORMAL HIGH (ref 0.50–1.10)
Creatinine, Ser: 1.4 mg/dL — ABNORMAL HIGH (ref 0.50–1.10)
GLUCOSE: 117 mg/dL — AB (ref 70–99)
Glucose, Bld: 172 mg/dL — ABNORMAL HIGH (ref 70–99)
HCT: 34 % — ABNORMAL LOW (ref 36.0–46.0)
HCT: 34 % — ABNORMAL LOW (ref 36.0–46.0)
Hemoglobin: 11.6 g/dL — ABNORMAL LOW (ref 12.0–15.0)
Hemoglobin: 11.6 g/dL — ABNORMAL LOW (ref 12.0–15.0)
Potassium: 4.1 mEq/L (ref 3.7–5.3)
Potassium: 4.2 mEq/L (ref 3.7–5.3)
SODIUM: 135 meq/L — AB (ref 137–147)
Sodium: 134 mEq/L — ABNORMAL LOW (ref 137–147)
TCO2: 15 mmol/L (ref 0–100)
TCO2: 16 mmol/L (ref 0–100)

## 2014-03-01 LAB — GLUCOSE, CAPILLARY
GLUCOSE-CAPILLARY: 102 mg/dL — AB (ref 70–99)
GLUCOSE-CAPILLARY: 93 mg/dL (ref 70–99)
Glucose-Capillary: 108 mg/dL — ABNORMAL HIGH (ref 70–99)
Glucose-Capillary: 156 mg/dL — ABNORMAL HIGH (ref 70–99)
Glucose-Capillary: 84 mg/dL (ref 70–99)
Glucose-Capillary: 96 mg/dL (ref 70–99)

## 2014-03-01 LAB — HEPARIN LEVEL (UNFRACTIONATED)
HEPARIN UNFRACTIONATED: 0.73 [IU]/mL — AB (ref 0.30–0.70)
Heparin Unfractionated: 1.8 IU/mL — ABNORMAL HIGH (ref 0.30–0.70)

## 2014-03-01 LAB — TROPONIN I: TROPONIN I: 1.4 ng/mL — AB (ref <0.30)

## 2014-03-01 LAB — LACTIC ACID, PLASMA: LACTIC ACID, VENOUS: 2.8 mmol/L — AB (ref 0.5–2.2)

## 2014-03-01 LAB — PROCALCITONIN: PROCALCITONIN: 7.17 ng/mL

## 2014-03-01 MED ORDER — METOPROLOL TARTRATE 1 MG/ML IV SOLN
2.5000 mg | INTRAVENOUS | Status: DC | PRN
  Administered 2014-03-01: 3 mg via INTRAVENOUS
  Administered 2014-03-02: 2.5 mg via INTRAVENOUS
  Administered 2014-03-07: 5 mg via INTRAVENOUS
  Filled 2014-03-01 (×3): qty 5

## 2014-03-01 MED ORDER — IPRATROPIUM-ALBUTEROL 0.5-2.5 (3) MG/3ML IN SOLN
3.0000 mL | Freq: Four times a day (QID) | RESPIRATORY_TRACT | Status: DC
  Administered 2014-03-01 – 2014-03-02 (×4): 3 mL via RESPIRATORY_TRACT
  Filled 2014-03-01 (×4): qty 3

## 2014-03-01 MED ORDER — BUDESONIDE 0.5 MG/2ML IN SUSP
0.5000 mg | Freq: Two times a day (BID) | RESPIRATORY_TRACT | Status: DC
Start: 2014-03-01 — End: 2014-03-16
  Administered 2014-03-01 – 2014-03-15 (×29): 0.5 mg via RESPIRATORY_TRACT
  Filled 2014-03-01 (×35): qty 2

## 2014-03-01 MED ORDER — HEPARIN (PORCINE) IN NACL 100-0.45 UNIT/ML-% IJ SOLN
1300.0000 [IU]/h | INTRAMUSCULAR | Status: DC
  Administered 2014-03-02: 1050 [IU]/h via INTRAVENOUS
  Administered 2014-03-04 – 2014-03-05 (×2): 1300 [IU]/h via INTRAVENOUS
  Filled 2014-03-01 (×11): qty 250

## 2014-03-01 MED ORDER — SODIUM CHLORIDE 0.9 % IV BOLUS (SEPSIS)
500.0000 mL | Freq: Once | INTRAVENOUS | Status: AC
  Administered 2014-03-01: 500 mL via INTRAVENOUS

## 2014-03-01 NOTE — Progress Notes (Signed)
ANTICOAGULATION CONSULT NOTE - Initial Consult  Pharmacy Consult for Heparin  Indication: chest pain/ACS  No Known Allergies  Patient Measurements: 66.7 kg  Vital Signs: Temp: 91.4 F (33 C) (08/08 0500) Pulse Rate: 98 (08/08 0500)  Labs:  Recent Labs  02/22/2014 0050 03/03/2014 0330  03/05/2014 0554 03/15/2014 0741 03/03/2014 1011  03/04/2014 2000 02/26/2014 2200 03/01/14 0029 03/01/14 0300 03/01/14 0400 03/01/14 0456  HGB 11.7* 11.7*  --  12.6 13.6  --   --   --   --   --  11.4*  --   --   HCT 37.3 36.8  --  37.0 40.0  --   --   --   --   --  34.1*  --   --   PLT 349 397  --   --   --   --   --   --   --   --  366  --   --   APTT 33  --   --   --   --   --   --   --   --   --   --   --   --   LABPROT 15.9*  --   --   --   --  14.3  --   --   --   --   --   --   --   INR 1.27  --   --   --   --  1.11  --   --   --   --   --   --   --   HEPARINUNFRC  --   --   --   --   --  <0.10*  --  <0.10*  --   --  1.80*  --   --   CREATININE 1.32* 1.26*  --  1.10 1.30* 1.18*  < > 1.25*  --  1.24*  --   --  1.31*  TROPONINI  --   --   < >  --   --  1.38*  < > 1.42* 1.41*  --   --  1.40*  --   < > = values in this interval not displayed.  Assessment: 73 y/o F s/p PEA arrest with ROSC, now on arctic sun protocol, starting re-warming this AM, HL is SUPRA-therapeutic this AM (drawn correctly).   Goal of Therapy:  Heparin level 0.3-0.7 units/ml Monitor platelets by anticoagulation protocol: Yes   Plan:  -Hold heparin x 1 hour -Re-start heparin at 900 units/hr at 0745 -1600 HL -Daily CBC/HL -Monitor for bleeding -May have increased heparin needs after re-warming  Abran Duke 03/01/2014,6:30 AM

## 2014-03-01 NOTE — Progress Notes (Signed)
ANTICOAGULATION CONSULT NOTE - Follow Up Consult  Pharmacy Consult for Heparin Indication: chest pain/ACS  No Known Allergies  Patient Measurements: Height: 5\' 3"  (160 cm) Weight: 152 lb 1.9 oz (69 kg) IBW/kg (Calculated) : 52.4 Heparin Dosing Weight: 66.5 kg  Vital Signs: Temp: 96.3 F (35.7 C) (08/08 1600) Temp src: Oral (08/08 1512) BP: 125/69 mmHg (08/08 1629) Pulse Rate: 109 (08/08 1629)  Labs:  Recent Labs  03/06/2014 0050 03/04/2014 0330  03/11/2014 1011  02/22/2014 2000 03/02/2014 2200  03/01/14 0300 03/01/14 0400  03/01/14 0809 03/01/14 1151 03/01/14 1256 03/01/14 1600  HGB 11.7* 11.7*  < >  --   --   --   --   --  11.4*  --   --  11.6* 11.6*  --   --   HCT 37.3 36.8  < >  --   --   --   --   --  34.1*  --   --  34.0* 34.0*  --   --   PLT 349 397  --   --   --   --   --   --  366  --   --   --   --   --   --   APTT 33  --   --   --   --   --   --   --   --   --   --   --   --   --   --   LABPROT 15.9*  --   --  14.3  --   --   --   --   --   --   --   --   --   --   --   INR 1.27  --   --  1.11  --   --   --   --   --   --   --   --   --   --   --   HEPARINUNFRC  --   --   < > <0.10*  --  <0.10*  --   --  1.80*  --   --   --   --   --  0.73*  CREATININE 1.32* 1.26*  < > 1.18*  < > 1.25*  --   < >  --   --   < > 1.40* 1.40* 1.38*  --   TROPONINI  --   --   < > 1.38*  < > 1.42* 1.41*  --   --  1.40*  --   --   --   --   --   < > = values in this interval not displayed.  Estimated Creatinine Clearance: 33.8 ml/min (by C-G formula based on Cr of 1.38).   Medications:  Heparin @ 900 units/hr (9 ml/hr)  Assessment: 73 YOF admitted on 8/6 after suffering a cardiac arrest at home. The patient underwent intubation and CPR and was started on the hypothermia protocol. The patient was started on heparin for NSTEMI. Cards considering cath if the patient has neurologic recovery.   The patient started rewarming at 0630 this morning.  Heparin level this evening remains slightly  SUPRAtherapeutic despite a rate adjustment this morning (HL 0.73 << 1.8, goal of 0.3-0.7). CBC stable - no bleeding or issues noted per the RN. Since the patient is still in the rewarming period - it is likely that heparin will start to be cleared faster. Will  only reduce rate slightly and will plan to recheck a level a little sooner in ~6 hours (as opposed to 8 hours)  Goal of Therapy:  Heparin level 0.3-0.7 units/ml Monitor platelets by anticoagulation protocol: Yes   Plan:  1. Decrease heparin drip rate slightly to 850 units/hr (8.5 ml/hr) 2. Will continue to monitor for any signs/symptoms of bleeding and will follow up with heparin level in 6 hours (earlier since the patient is still re-warming)  Georgina Pillion, PharmD, BCPS Clinical Pharmacist Pager: [phone] 03/01/2014 5:06 PM

## 2014-03-01 NOTE — Progress Notes (Signed)
Pt noted to open eyes spontaneously. Does not follow commands. Does not track. Corneal reflex intact. Family at bedside. Sedatives stopped at this time as per protocol.

## 2014-03-01 NOTE — Progress Notes (Signed)
eLink Physician-Brief Progress Note Patient Name: Melanie Williamson DOB: 1940/11/16 MRN: [id number]  Date of Service  03/01/2014   HPI/Events of Note   Tachycardia/ prob ST   Intake/Output Summary (Last 24 hours) at 03/01/14 2026 Last data filed at 03/01/14 1900  Gross per 24 hour  Intake 1945.55 ml  Output   1170 ml  Net 775.55 ml    CVP:  [2 mmHg-14 mmHg] 14 mmHg    eICU Interventions  NS x 500 cc/ try to d/c pressors Lopressor 2.5 -5 mg every 3 h prn IV    Intervention Category Intermediate Interventions: Arrhythmia - evaluation and management  Sandrea Hughs 03/01/2014, 8:26 PM

## 2014-03-01 NOTE — Progress Notes (Signed)
Patient ID: BELLAMY RUBEY, female   DOB: 07/26/1940, 73 y.o.   MRN: [id number]   SUBJECTIVE: Remains intubated, sedated and cooled.  She is on norepinephrine and vasopressin currently.   Scheduled Meds: . antiseptic oral rinse  7 mL Mouth Rinse QID  . artificial tears   Both Eyes 3 times per day  . aspirin  325 mg Oral Daily  . atorvastatin  40 mg Oral q1800  . budesonide (PULMICORT) nebulizer solution  0.5 mg Nebulization BID  . chlorhexidine  15 mL Mouth Rinse BID  . insulin aspart  2-6 Units Subcutaneous 6 times per day  . insulin glargine  30 Units Subcutaneous Q24H  . ipratropium-albuterol  3 mL Nebulization Q6H  . levofloxacin (LEVAQUIN) IV  500 mg Intravenous Q48H  . pantoprazole (PROTONIX) IV  40 mg Intravenous Q24H  . piperacillin-tazobactam (ZOSYN)  IV  3.375 g Intravenous 3 times per day  . vancomycin  1,000 mg Intravenous Q24H   Continuous Infusions: . sodium chloride 10 mL/hr (03/01/14 0826)  . cisatracurium (NIMBEX) infusion 1 mcg/kg/min (03/15/2014 0700)  . dextrose    . fentaNYL infusion INTRAVENOUS 100 mcg/hr (03/01/2014 1000)  . heparin 900 Units/hr (03/01/14 0845)  . midazolam (VERSED) infusion 4 mg/hr (03/01/14 0420)  . norepinephrine (LEVOPHED) Adult infusion 17 mcg/min (03/01/14 0850)  . vasopressin (PITRESSIN) infusion - *FOR SHOCK* 0.03 Units/min (03/13/2014 1136)   PRN Meds:.sodium chloride, albuterol, cisatracurium, dextrose, fentaNYL, fentaNYL    Filed Vitals:   03/01/14 0600 03/01/14 0640 03/01/14 0700 03/01/14 0800  BP:      Pulse: 91  79 88  Temp: 90.1 F (32.3 C) 89.4 F (31.9 C) 89.1 F (31.7 C) 90.3 F (32.4 C)  TempSrc:    Core (Comment)  Resp: 24 24 24 16   Height:      Weight: 152 lb 1.9 oz (69 kg)     SpO2: 100%  100% 100%    Intake/Output Summary (Last 24 hours) at 03/01/14 0907 Last data filed at 03/01/14 0800  Gross per 24 hour  Intake 2459.73 ml  Output   1818 ml  Net 641.73 ml    LABS: Basic Metabolic Panel:  Recent Labs  40/98/11 1011  03/01/14 0029 03/01/14 0456 03/01/14 0809  NA 137  < > 135* 134* 134*  K 3.4*  < > 4.1 4.4 4.1  CL 105  < > 102 100 104  CO2 15*  < > 15* 14*  --   GLUCOSE 317*  < > 95 170* 172*  BUN 27*  < > 28* 28* 26*  CREATININE 1.18*  < > 1.24* 1.31* 1.40*  CALCIUM 7.3*  < > 7.5* 7.5*  --   MG 1.8  --   --   --   --   < > = values in this interval not displayed. Liver Function Tests: No results found for this basename: AST, ALT, ALKPHOS, BILITOT, PROT, ALBUMIN,  in the last 72 hours No results found for this basename: LIPASE, AMYLASE,  in the last 72 hours CBC:  Recent Labs  03/06/2014 0330  03/01/14 0300 03/01/14 0809  WBC 18.4*  --  17.2*  --   HGB 11.7*  < > 11.4* 11.6*  HCT 36.8  < > 34.1* 34.0*  MCV 97.4  --  92.2  --   PLT 397  --  366  --   < > = values in this interval not displayed. Cardiac Enzymes:  Recent Labs  03/09/2014 2000 02/25/2014 2200 03/01/14 0400  TROPONINI 1.42* 1.41* 1.40*   BNP: No components found with this basename: POCBNP,  D-Dimer: No results found for this basename: DDIMER,  in the last 72 hours Hemoglobin A1C: No results found for this basename: HGBA1C,  in the last 72 hours Fasting Lipid Panel: No results found for this basename: CHOL, HDL, LDLCALC, TRIG, CHOLHDL, LDLDIRECT,  in the last 72 hours Thyroid Function Tests: No results found for this basename: TSH, T4TOTAL, FREET3, T3FREE, THYROIDAB,  in the last 72 hours Anemia Panel: No results found for this basename: VITAMINB12, FOLATE, FERRITIN, TIBC, IRON, RETICCTPCT,  in the last 72 hours  RADIOLOGY: Ct Head Wo Contrast  03/11/2014   CLINICAL DATA:  Rule out bleed, post resuscitation.  EXAM: CT HEAD WITHOUT CONTRAST  TECHNIQUE: Contiguous axial images were obtained from the base of the skull through the vertex without intravenous contrast.  COMPARISON:  CT of the head report September 12, 1998 though images are not available for direct comparison.  FINDINGS: The ventricles and sulci  are normal for age. No intraparenchymal hemorrhage, mass effect nor midline shift. Confluent supratentorial white matter hypodensities are within normal range for patient's age and though non-specific suggest sequelae of chronic small vessel ischemic disease. No acute large vascular territory infarcts. Bilateral basal ganglia and thalamus lacunar infarcts.  No abnormal extra-axial fluid collections. Basal cisterns are patent. Severe calcific atherosclerosis of the carotid siphons and included vertebral arteries.  No skull fracture. The included ocular globes and orbital contents are non-suspicious. Sphenoid sinus air-fluid levels, paranasal sinus mucosal thickening. Mastoid air cells are well aerated.  IMPRESSION: No evidence of intracranial hemorrhage.  Involutional changes. Severe white matter changes suggest chronic small vessel ischemic disease with bilateral thalamus and basal ganglia lacunar infarcts.   Electronically Signed   By: Awilda Metro   On: 02/27/2014 03:10   Dg Chest Port 1 View  03/15/2014   CLINICAL DATA:  Status post central line placement.  EXAM: PORTABLE CHEST - 1 VIEW  COMPARISON:  Chest x-ray from the same day at 04:37 a.m.  FINDINGS: Stable heart size and upper mediastinal contours. Unchanged diffuse pulmonary edema. No increasing pleural fluid. No appreciable pneumothorax.  New right IJ catheter, tip at the SVC. Unchanged and unremarkable positioning of endotracheal and orogastric tubes. Endotracheal tube ends between the clavicular heads and carina.  IMPRESSION: 1. New right IJ catheter, tip at the SVC.  No pneumothorax. 2. Unchanged pulmonary edema.   Electronically Signed   By: Tiburcio Pea M.D.   On: 03/03/2014 06:33   Dg Chest Port 1 View  02/24/2014   CLINICAL DATA:  Rule out pneumothorax.  History of hypertension.  EXAM: PORTABLE CHEST - 1 VIEW  COMPARISON:  Chest x-ray from the same day at 12:52 a.m.  FINDINGS: Unchanged borderline cardiomegaly. Stable aortic contours.  The endotracheal tube ends between the clavicular heads and carina. The orogastric tube reaches the stomach. There is unchanged pulmonary edema. No evidence of effusion or pneumothorax.  IMPRESSION: 1. No evidence of pneumothorax. 2. Endotracheal and orogastric tubes remain in good position. 3. Unchanged pulmonary edema.   Electronically Signed   By: Tiburcio Pea M.D.   On: 03/01/2014 05:01   Dg Chest Port 1 View  02/25/2014   CLINICAL DATA:  Assess endotracheal tube.  Status post CPR.  EXAM: PORTABLE CHEST - 1 VIEW  COMPARISON:  Chest radiograph October 03, 2013  FINDINGS: Endotracheal tube tip projects 3.5 cm above the carina. Nasogastric tube side port projects in proximal stomach, distal  tip not imaged. Cardiac silhouette appears mildly enlarged. Tortuous calcified aorta. Mediastinal silhouette is nonsuspicious. Increased lung volumes may reflect underlying COPD, unchanged. Diffuse interstitial prominence with patchy central alveolar airspace opacities. No pleural effusions. No pneumothorax.  Vascular calcifications projecting in the upper extremities. Osteopenia.  IMPRESSION: Endotracheal tube tip projects 3.5 cm above the carina, nasogastric tube past the proximal stomach.  Mild cardiomegaly, interstitial and alveolar airspace opacities suggesting pulmonary edema, less likely ARDS.   Electronically Signed   By: Awilda Metroourtnay  Bloomer   On: 02/22/2014 01:06    PHYSICAL EXAM General: Intubated/sedated Neck: No JVD, no thyromegaly or thyroid nodule.  Lungs: Decreased breath sounds throughout CV: Nondisplaced PMI.  Heart regular S1/S2, no S3/S4, no murmur.  No peripheral edema.   Abdomen: Soft, no hepatosplenomegaly, no distention.  Neurologic: Sedated  Extremities: No clubbing or cyanosis.   TELEMETRY: Reviewed telemetry pt in NSR  ASSESSMENT AND PLAN: 73 yo with history of COPD on home oxygen had PEA arrest yesterday, now intubated/cooled.  1. Cardiac arrest: PEA.  Suspect primary respiratory  arrest.  Now on hypothermia protocol requiring pressors.  Rewarming today.  2. Pulmonary: Severe COPD, suspect respiratory infection/COPD exacerbation is driving this. She is on vanco/Zosyn.  PCT elevated at 7.2.  3. Hypotension: ?related to hypothermia versus septic shock.  Wean pressors today with rewarming. Will send co-ox.  4. Elevated troponin: Mild TnI elevation to 1.4, steady.  Suspect this is most likely demand ischemia related to PEA arrest/hypotension.  Has baseline LBBB.  Echo showed EF 35-40% with wall motion abnormalities, this is worse than prior (EF 50%).  Could be stunning related to arrest.   - Continue ASA, statin, heparin gtt.  Would stop heparin gtt tomorrow if no other indication.  - If she has neurologic recovery, should get cardiac cath (though as above, suspect primary respiratory event).   Marca AnconaDalton Solyana Nonaka 03/01/2014 9:21 AM

## 2014-03-01 NOTE — Progress Notes (Signed)
PULMONARY / CRITICAL CARE MEDICINE  Name: Melanie Williamson MRN: [id number] DOB: Jun 11, 1941    ADMISSION DATE:  03/19/2014  REFERRING MD :  EDP  CHIEF COMPLAINT:  Cardiac arrest  INITIAL PRESENTATION: 73 yo with COPD (on nocturnal oxygen) admitted 8/6 after suffering a witnessed cardiac arrest at home. She was intubated in field and ROSC was achieved with 10 mins of CPR and epi x 3. Maximum total downtime 10-18 minutes based on EMS.  STUDIES:  8/6  CT head >>> nad 8/7  Echo >> EF 35 to 40%, mod MR 8/7  Doppler legs b/l >> no DVT  SIGNIFICANT EVENTS: 8/6  Admitted after cardiac arrest, hypothermia protocol started 8/8  Rewarming started at 6:30 am  INTERVAL HISTORY:  Rewarming started this AM.  VITAL SIGNS: Temp:  [88.9 F (31.6 C)-92.3 F (33.5 C)] 90.3 F (32.4 C) (08/08 0800) Pulse Rate:  [62-107] 88 (08/08 0800) Resp:  [12-28] 16 (08/08 0800) BP: (81-129)/(24-59) 129/59 mmHg (08/07 1344) SpO2:  [92 %-100 %] 100 % (08/08 0800) Arterial Line BP: (107-182)/(56-100) 114/61 mmHg (08/08 0800) FiO2 (%):  [50 %-100 %] 50 % (08/08 0400) Weight:  [152 lb 1.9 oz (69 kg)] 152 lb 1.9 oz (69 kg) (08/08 0600)  HEMODYNAMICS: CVP:  [3 mmHg-9 mmHg] 3 mmHg  VENTILATOR SETTINGS: Vent Mode:  [-] PRVC FiO2 (%):  [50 %-100 %] 50 % Set Rate:  [24 bmp] 24 bmp Vt Set:  [450 mL] 450 mL PEEP:  [8 cmH20-12 cmH20] 8 cmH20 Plateau Pressure:  [20 cmH20-23 cmH20] 20 cmH20  INTAKE / OUTPUT: Intake/Output     08/07 0701 - 08/08 0700 08/08 0701 - 08/09 0700   I.V. (mL/kg) 1959.6 (28.4)    NG/GT 40    IV Piggyback 700    Total Intake(mL/kg) 2699.6 (39.1)    Urine (mL/kg/hr) 2083 (1.3)    Total Output 2083     Net +616.6            PHYSICAL EXAMINATION: General: no distress Neuro: RASS -4 HEENT: ETT in place Cardiovascular: regular, 2/6 SM Lungs: no wheeze Abdomen:  Soft, nontender Musculoskeletal:  No edema Skin:  No rash  LABS:  CBC  Recent Labs Lab 03/18/2014 0050 02/22/2014 0330   03/15/2014 0741 03/01/14 0300 03/01/14 0809  WBC 15.4* 18.4*  --   --  17.2*  --   HGB 11.7* 11.7*  < > 13.6 11.4* 11.6*  HCT 37.3 36.8  < > 40.0 34.1* 34.0*  PLT 349 397  --   --  366  --   < > = values in this interval not displayed.  Coag's  Recent Labs Lab 03/05/2014 0050 02/27/2014 1011  APTT 33  --   INR 1.27 1.11   BMET  Recent Labs Lab 03/19/2014 2000 03/01/14 0029 03/01/14 0456 03/01/14 0809  NA 136* 135* 134* 134*  K 4.1 4.1 4.4 4.1  CL 103 102 100 104  CO2 13* 15* 14*  --   BUN 28* 28* 28* 26*  CREATININE 1.25* 1.24* 1.31* 1.40*  GLUCOSE 133* 95 170* 172*   Electrolytes  Recent Labs Lab 02/22/2014 1011  03/19/2014 2000 03/01/14 0029 03/01/14 0456  CALCIUM 7.3*  < > 7.8* 7.5* 7.5*  MG 1.8  --   --   --   --   < > = values in this interval not displayed.  Sepsis Markers  Recent Labs Lab 03/07/2014 0059 03/14/2014 1011 03/06/2014 1600 03/01/2014 2200 03/01/14 0400  LATICACIDVEN 8.15* 3.5* 3.0*  3.3* 2.8*  PROCALCITON  --  4.62  --   --  7.17   ABG  Recent Labs Lab 03-09-2014 0201 March 09, 2014 0502 03/09/2014 0900  PHART 7.129* 7.218* 7.219*  PCO2ART 56.7* 44.1 37.6  PO2ART 105.0* 66.0* 183.0*   Cardiac Enzymes  Recent Labs Lab 03/09/2014 2000 March 09, 2014 2200 03/01/14 0400  TROPONINI 1.42* 1.41* 1.40*   Glucose  Recent Labs Lab 2014/03/09 1958 Mar 09, 2014 2101 03-09-2014 2159 2014-03-09 2302 03/01/14 0010 03/01/14 0346  GLUCAP 124* 120* 102* 93 96 156*   IMAGING:  Ct Head Wo Contrast  03-09-2014   CLINICAL DATA:  Rule out bleed, post resuscitation.  EXAM: CT HEAD WITHOUT CONTRAST  TECHNIQUE: Contiguous axial images were obtained from the base of the skull through the vertex without intravenous contrast.  COMPARISON:  CT of the head report September 12, 1998 though images are not available for direct comparison.  FINDINGS: The ventricles and sulci are normal for age. No intraparenchymal hemorrhage, mass effect nor midline shift. Confluent supratentorial white  matter hypodensities are within normal range for patient's age and though non-specific suggest sequelae of chronic small vessel ischemic disease. No acute large vascular territory infarcts. Bilateral basal ganglia and thalamus lacunar infarcts.  No abnormal extra-axial fluid collections. Basal cisterns are patent. Severe calcific atherosclerosis of the carotid siphons and included vertebral arteries.  No skull fracture. The included ocular globes and orbital contents are non-suspicious. Sphenoid sinus air-fluid levels, paranasal sinus mucosal thickening. Mastoid air cells are well aerated.  IMPRESSION: No evidence of intracranial hemorrhage.  Involutional changes. Severe white matter changes suggest chronic small vessel ischemic disease with bilateral thalamus and basal ganglia lacunar infarcts.   Electronically Signed   By: Awilda Metro   On: Mar 09, 2014 03:10   Dg Chest Port 1 View  2014/03/09   CLINICAL DATA:  Status post central line placement.  EXAM: PORTABLE CHEST - 1 VIEW  COMPARISON:  Chest x-ray from the same day at 04:37 a.m.  FINDINGS: Stable heart size and upper mediastinal contours. Unchanged diffuse pulmonary edema. No increasing pleural fluid. No appreciable pneumothorax.  New right IJ catheter, tip at the SVC. Unchanged and unremarkable positioning of endotracheal and orogastric tubes. Endotracheal tube ends between the clavicular heads and carina.  IMPRESSION: 1. New right IJ catheter, tip at the SVC.  No pneumothorax. 2. Unchanged pulmonary edema.   Electronically Signed   By: Tiburcio Pea M.D.   On: 03-09-14 06:33   Dg Chest Port 1 View  2014/03/09   CLINICAL DATA:  Rule out pneumothorax.  History of hypertension.  EXAM: PORTABLE CHEST - 1 VIEW  COMPARISON:  Chest x-ray from the same day at 12:52 a.m.  FINDINGS: Unchanged borderline cardiomegaly. Stable aortic contours. The endotracheal tube ends between the clavicular heads and carina. The orogastric tube reaches the stomach. There  is unchanged pulmonary edema. No evidence of effusion or pneumothorax.  IMPRESSION: 1. No evidence of pneumothorax. 2. Endotracheal and orogastric tubes remain in good position. 3. Unchanged pulmonary edema.   Electronically Signed   By: Tiburcio Pea M.D.   On: 03/09/2014 05:01   Dg Chest Port 1 View  Mar 09, 2014   CLINICAL DATA:  Assess endotracheal tube.  Status post CPR.  EXAM: PORTABLE CHEST - 1 VIEW  COMPARISON:  Chest radiograph October 03, 2013  FINDINGS: Endotracheal tube tip projects 3.5 cm above the carina. Nasogastric tube side port projects in proximal stomach, distal tip not imaged. Cardiac silhouette appears mildly enlarged. Tortuous calcified aorta. Mediastinal silhouette is  nonsuspicious. Increased lung volumes may reflect underlying COPD, unchanged. Diffuse interstitial prominence with patchy central alveolar airspace opacities. No pleural effusions. No pneumothorax.  Vascular calcifications projecting in the upper extremities. Osteopenia.  IMPRESSION: Endotracheal tube tip projects 3.5 cm above the carina, nasogastric tube past the proximal stomach.  Mild cardiomegaly, interstitial and alveolar airspace opacities suggesting pulmonary edema, less likely ARDS.   Electronically Signed   By: Awilda Metroourtnay  Bloomer   On: 03/14/2014 01:06   ASSESSMENT / PLAN:  PULMONARY ETT 8/6 >>  A: Acute respiratory failure 2nd to AECOPD, aspiration PNA, and acute pulmonary edema. P:   Adjust PEEP, FiO2 to keep SaO2 90 to 94% F/u CXR, ABG Continue pulmicort, duoneb Allow for some degree of permissive hypercapnia to avoid PEEPi  CARDIOVASCULAR Rt IJ CVL 8/7 >> Rt femoral Aline 8/7 >>  A:  PEA arrest with NSTEMI with acute systolic heart failure, likely secondary to respiratory compromise. Shock ( septic vs cardiogenic ). P:  Goal MAP > 75 while on hypothermia protocol Continue ASA, Lipitor BB / ACEI contraindicated - shock  RENAL A:   AKI. P:   Monitor renal fx, urine outpt  electrolytes  GASTROINTESTINAL A:   Nutrition. GIPx. P:   NPO Protonix TF if not extubated after Hypothermia Protocol done  HEMATOLOGIC A:   VTE Px. Therapeutic heparinization for ACS. P:  Trend CBC Heparin per pharmacy  INFECTIOUS A:   Possible aspiration pneumonia P:   Day 2 vancomycin, zosyn, levaquin started 8/7 Blood cx 8/7 >>  Urine cx 8/7 >>  ENDOCRINE A:   Hyperglycemia. P:   SSI  NEUROLOGIC A:   Acute encephalopathy with possible anoxia. P:   Hypothermia protocol >> rewarming started in AM of 8/08  CC time 35 minutes.  Coralyn HellingVineet Ellianah Cordy, MD [facility] 03/01/2014, 8:41 AM Pager:  [phone] After 3pm call: [phone]

## 2014-03-02 ENCOUNTER — Inpatient Hospital Stay (HOSPITAL_COMMUNITY): Payer: Medicare Other

## 2014-03-02 DIAGNOSIS — I498 Other specified cardiac arrhythmias: Secondary | ICD-10-CM

## 2014-03-02 DIAGNOSIS — I471 Supraventricular tachycardia: Secondary | ICD-10-CM | POA: Diagnosis present

## 2014-03-02 LAB — BASIC METABOLIC PANEL
Anion gap: 16 — ABNORMAL HIGH (ref 5–15)
BUN: 31 mg/dL — ABNORMAL HIGH (ref 6–23)
CHLORIDE: 101 meq/L (ref 96–112)
CO2: 16 meq/L — AB (ref 19–32)
CREATININE: 1.71 mg/dL — AB (ref 0.50–1.10)
Calcium: 7.2 mg/dL — ABNORMAL LOW (ref 8.4–10.5)
GFR calc non Af Amer: 28 mL/min — ABNORMAL LOW (ref >90)
GFR, EST AFRICAN AMERICAN: 33 mL/min — AB (ref >90)
Glucose, Bld: 49 mg/dL — ABNORMAL LOW (ref 70–99)
Potassium: 5.6 mEq/L — ABNORMAL HIGH (ref 3.7–5.3)
Sodium: 133 mEq/L — ABNORMAL LOW (ref 137–147)

## 2014-03-02 LAB — COMPREHENSIVE METABOLIC PANEL
ALT: 24 U/L (ref 0–35)
ANION GAP: 15 (ref 5–15)
AST: 36 U/L (ref 0–37)
Albumin: 2.1 g/dL — ABNORMAL LOW (ref 3.5–5.2)
Alkaline Phosphatase: 73 U/L (ref 39–117)
BUN: 33 mg/dL — ABNORMAL HIGH (ref 6–23)
CO2: 16 meq/L — AB (ref 19–32)
Calcium: 7 mg/dL — ABNORMAL LOW (ref 8.4–10.5)
Chloride: 103 mEq/L (ref 96–112)
Creatinine, Ser: 1.79 mg/dL — ABNORMAL HIGH (ref 0.50–1.10)
GFR calc Af Amer: 31 mL/min — ABNORMAL LOW (ref >90)
GFR, EST NON AFRICAN AMERICAN: 27 mL/min — AB (ref >90)
GLUCOSE: 92 mg/dL (ref 70–99)
Potassium: 5.3 mEq/L (ref 3.7–5.3)
SODIUM: 134 meq/L — AB (ref 137–147)
Total Bilirubin: 0.3 mg/dL (ref 0.3–1.2)
Total Protein: 5.4 g/dL — ABNORMAL LOW (ref 6.0–8.3)

## 2014-03-02 LAB — CBC
HCT: 28.8 % — ABNORMAL LOW (ref 36.0–46.0)
Hemoglobin: 9.5 g/dL — ABNORMAL LOW (ref 12.0–15.0)
MCH: 30.9 pg (ref 26.0–34.0)
MCHC: 33 g/dL (ref 30.0–36.0)
MCV: 93.8 fL (ref 78.0–100.0)
PLATELETS: 261 10*3/uL (ref 150–400)
RBC: 3.07 MIL/uL — ABNORMAL LOW (ref 3.87–5.11)
RDW: 13.2 % (ref 11.5–15.5)
WBC: 19 10*3/uL — ABNORMAL HIGH (ref 4.0–10.5)

## 2014-03-02 LAB — BLOOD GAS, ARTERIAL
ACID-BASE DEFICIT: 9.2 mmol/L — AB (ref 0.0–2.0)
BICARBONATE: 15.8 meq/L — AB (ref 20.0–24.0)
Drawn by: 28340
FIO2: 40 %
O2 SAT: 98.5 %
PEEP: 8 cmH2O
Patient temperature: 98.6
RATE: 24 resp/min
TCO2: 16.8 mmol/L (ref 0–100)
pCO2 arterial: 32.6 mmHg — ABNORMAL LOW (ref 35.0–45.0)
pH, Arterial: 7.308 — ABNORMAL LOW (ref 7.350–7.450)
pO2, Arterial: 127 mmHg — ABNORMAL HIGH (ref 80.0–100.0)

## 2014-03-02 LAB — URINE CULTURE
CULTURE: NO GROWTH
Colony Count: NO GROWTH

## 2014-03-02 LAB — GLUCOSE, CAPILLARY
GLUCOSE-CAPILLARY: 92 mg/dL (ref 70–99)
Glucose-Capillary: 101 mg/dL — ABNORMAL HIGH (ref 70–99)
Glucose-Capillary: 106 mg/dL — ABNORMAL HIGH (ref 70–99)
Glucose-Capillary: 124 mg/dL — ABNORMAL HIGH (ref 70–99)
Glucose-Capillary: 214 mg/dL — ABNORMAL HIGH (ref 70–99)
Glucose-Capillary: 48 mg/dL — ABNORMAL LOW (ref 70–99)
Glucose-Capillary: 88 mg/dL (ref 70–99)
Glucose-Capillary: 94 mg/dL (ref 70–99)
Glucose-Capillary: 95 mg/dL (ref 70–99)
Glucose-Capillary: >600 mg/dL (ref 70–99)

## 2014-03-02 LAB — HEPARIN LEVEL (UNFRACTIONATED)
HEPARIN UNFRACTIONATED: 0.41 [IU]/mL (ref 0.30–0.70)
HEPARIN UNFRACTIONATED: 0.12 [IU]/mL — AB (ref 0.30–0.70)

## 2014-03-02 LAB — MAGNESIUM: Magnesium: 1.4 mg/dL — ABNORMAL LOW (ref 1.5–2.5)

## 2014-03-02 MED ORDER — DEXTROSE 50 % IV SOLN
INTRAVENOUS | Status: AC
  Filled 2014-03-02: qty 50

## 2014-03-02 MED ORDER — SODIUM BICARBONATE 650 MG PO TABS
650.0000 mg | ORAL_TABLET | Freq: Three times a day (TID) | ORAL | Status: AC
  Administered 2014-03-02 – 2014-03-03 (×6): 650 mg
  Filled 2014-03-02 (×6): qty 1

## 2014-03-02 MED ORDER — AMIODARONE HCL IN DEXTROSE 360-4.14 MG/200ML-% IV SOLN
30.0000 mg/h | INTRAVENOUS | Status: DC
  Administered 2014-03-02 – 2014-03-05 (×6): 30 mg/h via INTRAVENOUS
  Filled 2014-03-02 (×14): qty 200

## 2014-03-02 MED ORDER — VITAL HIGH PROTEIN PO LIQD
1000.0000 mL | ORAL | Status: DC
  Administered 2014-03-02 (×2)
  Administered 2014-03-02: 1000 mL
  Administered 2014-03-02 – 2014-03-03 (×2)
  Filled 2014-03-02 (×3): qty 1000

## 2014-03-02 MED ORDER — BISACODYL 10 MG RE SUPP: 10.0000 mg | Freq: Every day | RECTAL | Status: DC | PRN

## 2014-03-02 MED ORDER — SODIUM CHLORIDE 0.9 % IV BOLUS (SEPSIS)
250.0000 mL | Freq: Once | INTRAVENOUS | Status: AC
  Administered 2014-03-02: 250 mL via INTRAVENOUS

## 2014-03-02 MED ORDER — BISACODYL 5 MG PO TBEC: 5.0000 mg | DELAYED_RELEASE_TABLET | Freq: Every day | ORAL | Status: DC | PRN

## 2014-03-02 MED ORDER — LEVALBUTEROL HCL 0.63 MG/3ML IN NEBU
0.6300 mg | INHALATION_SOLUTION | RESPIRATORY_TRACT | Status: DC | PRN
  Administered 2014-03-08: 0.63 mg via RESPIRATORY_TRACT
  Filled 2014-03-02: qty 3

## 2014-03-02 MED ORDER — PANTOPRAZOLE SODIUM 40 MG PO PACK
40.0000 mg | PACK | ORAL | Status: DC
  Administered 2014-03-02 – 2014-03-04 (×3): 40 mg
  Filled 2014-03-02 (×5): qty 20

## 2014-03-02 MED ORDER — INSULIN GLARGINE 100 UNIT/ML ~~LOC~~ SOLN
20.0000 [IU] | SUBCUTANEOUS | Status: DC
  Administered 2014-03-02 – 2014-03-04 (×3): 20 [IU] via SUBCUTANEOUS
  Filled 2014-03-02 (×5): qty 0.2

## 2014-03-02 MED ORDER — AMIODARONE LOAD VIA INFUSION
150.0000 mg | Freq: Once | INTRAVENOUS | Status: AC
  Administered 2014-03-02: 150 mg via INTRAVENOUS
  Filled 2014-03-02: qty 83.34

## 2014-03-02 MED ORDER — ACETAMINOPHEN 160 MG/5ML PO SOLN
500.0000 mg | Freq: Four times a day (QID) | ORAL | Status: DC | PRN
  Administered 2014-03-02: 500 mg
  Filled 2014-03-02: qty 20.3

## 2014-03-02 MED ORDER — ARFORMOTEROL TARTRATE 15 MCG/2ML IN NEBU
15.0000 ug | INHALATION_SOLUTION | Freq: Two times a day (BID) | RESPIRATORY_TRACT | Status: DC
  Administered 2014-03-02 – 2014-03-15 (×25): 15 ug via RESPIRATORY_TRACT
  Filled 2014-03-02 (×32): qty 2

## 2014-03-02 MED ORDER — FENTANYL CITRATE 0.05 MG/ML IJ SOLN
50.0000 ug | INTRAMUSCULAR | Status: DC | PRN
  Administered 2014-03-03: 25 ug via INTRAVENOUS
  Filled 2014-03-02: qty 2

## 2014-03-02 MED ORDER — IPRATROPIUM BROMIDE 0.02 % IN SOLN
0.5000 mg | Freq: Two times a day (BID) | RESPIRATORY_TRACT | Status: DC
  Administered 2014-03-02 – 2014-03-08 (×12): 0.5 mg via RESPIRATORY_TRACT
  Filled 2014-03-02 (×14): qty 2.5

## 2014-03-02 MED ORDER — DEXTROSE 50 % IV SOLN
50.0000 mL | Freq: Once | INTRAVENOUS | Status: AC | PRN
  Administered 2014-03-02: 50 mL via INTRAVENOUS

## 2014-03-02 MED ORDER — DOCUSATE SODIUM 100 MG PO CAPS
100.0000 mg | ORAL_CAPSULE | Freq: Two times a day (BID) | ORAL | Status: DC | PRN
  Filled 2014-03-02: qty 1

## 2014-03-02 MED ORDER — AMIODARONE HCL IN DEXTROSE 360-4.14 MG/200ML-% IV SOLN
60.0000 mg/h | INTRAVENOUS | Status: AC
  Administered 2014-03-02: 60 mg/h via INTRAVENOUS
  Filled 2014-03-02: qty 200

## 2014-03-02 MED ORDER — INSULIN ASPART 100 UNIT/ML ~~LOC~~ SOLN
0.0000 [IU] | SUBCUTANEOUS | Status: DC
  Administered 2014-03-03: 3 [IU] via SUBCUTANEOUS
  Administered 2014-03-03 – 2014-03-04 (×5): 2 [IU] via SUBCUTANEOUS
  Administered 2014-03-05: 3 [IU] via SUBCUTANEOUS

## 2014-03-02 MED ORDER — MIDAZOLAM HCL 2 MG/2ML IJ SOLN
1.0000 mg | INTRAMUSCULAR | Status: DC | PRN
  Administered 2014-03-03: 2 mg via INTRAVENOUS
  Administered 2014-03-03: 1 mg via INTRAVENOUS
  Administered 2014-03-03 (×2): 2 mg via INTRAVENOUS
  Filled 2014-03-02 (×4): qty 2

## 2014-03-02 MED ORDER — VANCOMYCIN HCL IN DEXTROSE 750-5 MG/150ML-% IV SOLN
750.0000 mg | INTRAVENOUS | Status: DC
  Administered 2014-03-02 – 2014-03-04 (×3): 750 mg via INTRAVENOUS
  Filled 2014-03-02 (×3): qty 150

## 2014-03-02 MED ORDER — DEXTROSE 5 % IV SOLN
2.0000 ug/min | INTRAVENOUS | Status: DC
  Administered 2014-03-02: 4 ug/min via INTRAVENOUS
  Filled 2014-03-02 (×2): qty 4

## 2014-03-02 MED ORDER — FENTANYL CITRATE 0.05 MG/ML IJ SOLN
50.0000 ug | INTRAMUSCULAR | Status: DC | PRN
  Administered 2014-03-03: 25 ug via INTRAVENOUS
  Administered 2014-03-03 – 2014-03-05 (×4): 50 ug via INTRAVENOUS
  Filled 2014-03-02 (×5): qty 2

## 2014-03-02 MED ORDER — SODIUM CHLORIDE 0.9 % IV BOLUS (SEPSIS)
500.0000 mL | Freq: Once | INTRAVENOUS | Status: AC
  Administered 2014-03-02: 500 mL via INTRAVENOUS

## 2014-03-02 NOTE — Progress Notes (Signed)
Patient ID: Melanie Williamson, female   DOB: 09/05/1940, 73 y.o.   MRN: [id number]   SUBJECTIVE:   Rewarmed. Remains intubated. Off pressors and sedation x 12 hours. Unresponsive even to deep pain.  Pending repeat head CT today.   HR remains in 120s. Appears to be in AFL on tele.  TELE 105-130 ? AFL/atrial tach   Scheduled Meds: . antiseptic oral rinse  7 mL Mouth Rinse QID  . arformoterol  15 mcg Nebulization Q12H  . aspirin  325 mg Oral Daily  . atorvastatin  40 mg Oral q1800  . budesonide (PULMICORT) nebulizer solution  0.5 mg Nebulization BID  . chlorhexidine  15 mL Mouth Rinse BID  . dextrose      . feeding supplement (VITAL HIGH PROTEIN)  1,000 mL Per Tube Q24H  . insulin aspart  0-15 Units Subcutaneous 6 times per day  . insulin glargine  20 Units Subcutaneous Q24H  . ipratropium  0.5 mg Nebulization Q12H  . levofloxacin (LEVAQUIN) IV  500 mg Intravenous Q48H  . pantoprazole sodium  40 mg Per Tube Q24H  . piperacillin-tazobactam (ZOSYN)  IV  3.375 g Intravenous 3 times per day  . sodium bicarbonate  650 mg Per Tube TID  . vancomycin  750 mg Intravenous Q24H   Continuous Infusions: . sodium chloride Stopped (03/02/14 0531)  . heparin 1,050 Units/hr (03/02/14 1044)   PRN Meds:.sodium chloride, bisacodyl, bisacodyl, docusate sodium, fentaNYL, fentaNYL, levalbuterol, metoprolol, midazolam    Filed Vitals:   03/02/14 1000 03/02/14 1026 03/02/14 1100 03/02/14 1145  BP:      Pulse: 126  123 134  Temp: 100.6 F (38.1 C)  100.6 F (38.1 C) 100.9 F (38.3 C)  TempSrc:      Resp: 33  28 31  Height:      Weight:      SpO2: 99% 100% 99% 96%    Intake/Output Summary (Last 24 hours) at 03/02/14 1148 Last data filed at 03/02/14 1100  Gross per 24 hour  Intake 1870.39 ml  Output    400 ml  Net 1470.39 ml    LABS: Basic Metabolic Panel:  Recent Labs  16/83/72 1011  03/02/14 0053 03/02/14 0456  NA 137  < > 133* 134*  K 3.4*  < > 5.6* 5.3  CL 105  < > 101 103  CO2  15*  < > 16* 16*  GLUCOSE 317*  < > 49* 92  BUN 27*  < > 31* 33*  CREATININE 1.18*  < > 1.71* 1.79*  CALCIUM 7.3*  < > 7.2* 7.0*  MG 1.8  --   --  1.4*  < > = values in this interval not displayed. Liver Function Tests:  Recent Labs  03/02/14 0456  AST 36  ALT 24  ALKPHOS 73  BILITOT 0.3  PROT 5.4*  ALBUMIN 2.1*   No results found for this basename: LIPASE, AMYLASE,  in the last 72 hours CBC:  Recent Labs  03/01/14 0300  03/01/14 1151 03/02/14 0456  WBC 17.2*  --   --  19.0*  HGB 11.4*  < > 11.6* 9.5*  HCT 34.1*  < > 34.0* 28.8*  MCV 92.2  --   --  93.8  PLT 366  --   --  261  < > = values in this interval not displayed. Cardiac Enzymes:  Recent Labs  02/24/2014 2000 03/15/2014 2200 03/01/14 0400  TROPONINI 1.42* 1.41* 1.40*   BNP: No components found with this basename:  POCBNP,  D-Dimer: No results found for this basename: DDIMER,  in the last 72 hours Hemoglobin A1C: No results found for this basename: HGBA1C,  in the last 72 hours Fasting Lipid Panel: No results found for this basename: CHOL, HDL, LDLCALC, TRIG, CHOLHDL, LDLDIRECT,  in the last 72 hours Thyroid Function Tests: No results found for this basename: TSH, T4TOTAL, FREET3, T3FREE, THYROIDAB,  in the last 72 hours Anemia Panel: No results found for this basename: VITAMINB12, FOLATE, FERRITIN, TIBC, IRON, RETICCTPCT,  in the last 72 hours  RADIOLOGY: Ct Head Wo Contrast  03/12/2014   CLINICAL DATA:  Rule out bleed, post resuscitation.  EXAM: CT HEAD WITHOUT CONTRAST  TECHNIQUE: Contiguous axial images were obtained from the base of the skull through the vertex without intravenous contrast.  COMPARISON:  CT of the head report September 12, 1998 though images are not available for direct comparison.  FINDINGS: The ventricles and sulci are normal for age. No intraparenchymal hemorrhage, mass effect nor midline shift. Confluent supratentorial white matter hypodensities are within normal range for patient's  age and though non-specific suggest sequelae of chronic small vessel ischemic disease. No acute large vascular territory infarcts. Bilateral basal ganglia and thalamus lacunar infarcts.  No abnormal extra-axial fluid collections. Basal cisterns are patent. Severe calcific atherosclerosis of the carotid siphons and included vertebral arteries.  No skull fracture. The included ocular globes and orbital contents are non-suspicious. Sphenoid sinus air-fluid levels, paranasal sinus mucosal thickening. Mastoid air cells are well aerated.  IMPRESSION: No evidence of intracranial hemorrhage.  Involutional changes. Severe white matter changes suggest chronic small vessel ischemic disease with bilateral thalamus and basal ganglia lacunar infarcts.   Electronically Signed   By: Awilda Metro   On: 03/01/2014 03:10   Dg Chest Port 1 View     CLINICAL DATA:  Status post central line placement.  EXAM: PORTABLE CHEST - 1 VIEW  COMPARISON:  Chest x-ray from the same day at 04:37 a.m.  FINDINGS: Stable heart size and upper mediastinal contours. Unchanged diffuse pulmonary edema. No increasing pleural fluid. No appreciable pneumothorax.  New right IJ catheter, tip at the SVC. Unchanged and unremarkable positioning of endotracheal and orogastric tubes. Endotracheal tube ends between the clavicular heads and carina.  IMPRESSION: 1. New right IJ catheter, tip at the SVC.  No pneumothorax. 2. Unchanged pulmonary edema.   Electronically Signed   By: Tiburcio Pea M.D.   On: 03/15/2014 06:33   Dg Chest Port 1 View  03/10/2014   CLINICAL DATA:  Rule out pneumothorax.  History of hypertension.  EXAM: PORTABLE CHEST - 1 VIEW  COMPARISON:  Chest x-ray from the same day at 12:52 a.m.  FINDINGS: Unchanged borderline cardiomegaly. Stable aortic contours. The endotracheal tube ends between the clavicular heads and carina. The orogastric tube reaches the stomach. There is unchanged pulmonary edema. No evidence of effusion or  pneumothorax.  IMPRESSION: 1. No evidence of pneumothorax. 2. Endotracheal and orogastric tubes remain in good position. 3. Unchanged pulmonary edema.   Electronically Signed   By: Tiburcio Pea M.D.   On: 02/24/2014 05:01   Dg Chest Port 1 View  02/27/2014   CLINICAL DATA:  Assess endotracheal tube.  Status post CPR.  EXAM: PORTABLE CHEST - 1 VIEW  COMPARISON:  Chest radiograph October 03, 2013  FINDINGS: Endotracheal tube tip projects 3.5 cm above the carina. Nasogastric tube side port projects in proximal stomach, distal tip not imaged. Cardiac silhouette appears mildly enlarged. Tortuous calcified aorta. Mediastinal silhouette  is nonsuspicious. Increased lung volumes may reflect underlying COPD, unchanged. Diffuse interstitial prominence with patchy central alveolar airspace opacities. No pleural effusions. No pneumothorax.  Vascular calcifications projecting in the upper extremities. Osteopenia.  IMPRESSION: Endotracheal tube tip projects 3.5 cm above the carina, nasogastric tube past the proximal stomach.  Mild cardiomegaly, interstitial and alveolar airspace opacities suggesting pulmonary edema, less likely ARDS.   Electronically Signed   By: Awilda Metroourtnay  Bloomer   On: 01-19-14 01:06    PHYSICAL EXAM General: Intubated. Unressponsive even to deep pain. Pupils pinpoint  Neck: No JVD, no thyromegaly or thyroid nodule.  Lungs: Decreased breath sounds throughout CV: Nondisplaced PMI.  Tachy regular S1/S2, no S3/S4, no murmur.  No peripheral edema.  Clicking over sternum ? Fractured ribs Abdomen: Soft, no hepatosplenomegaly, no distention.  Neurologic: unresponsive even to deep pain  Extremities: No clubbing or cyanosis.   TELEMETRY: Reviewed telemetry pt in NSR  ASSESSMENT AND PLAN: 73 yo with history of COPD on home oxygen had PEA arrest yesterday, now intubated and s/p cooling  1. Cardiac arrest: PEA.  Suspect primary respiratory arrest.  Now rewarmed and off sedation. Exam concerning for  anoxic brain injury. Pending repeat head CT today 2. Pulmonary: Severe COPD, suspect respiratory infection/COPD exacerbation is driving this. She is on vanco/Zosyn.  PCT elevated at 7.2. PCCM managing 3. Hypotension: ?related to hypothermia versus septic shock.  On abx 4. Tachycardia; Appears to be AFL/a tach. BP too soft for cardizem. Will start amio. Continue heparin 5. Elevated troponin: Mild TnI elevation to 1.4, steady.  Suspect this is most likely demand ischemia related to PEA arrest/hypotension.  Has baseline LBBB.  Echo showed EF 35-40% with wall motion abnormalities, this is worse than prior (EF 50%).  Could be stunning related to arrest.   - Continue ASA, statin, heparin gtt.  - If she has neurologic recovery can consider cardiac cath  6. Hyponatremia/Hyperkalemia 7. A/c renal failure, stage IV  Exam concerning for anoxic brain injury. Agree with repeat head CT. Updated family on situation. Apparently down time 10-18 mins so prognosis very guarded. Will continue off sedation. Next 24-48 hours will be telling. Start amio for AFT/atach  The patient is critically ill with multiple organ systems failure and requires high complexity decision making for assessment and support, frequent evaluation and titration of therapies, application of advanced monitoring technologies and extensive interpretation of multiple databases.   Critical Care Time devoted to patient care services described in this note is 35 Minutes.   Arvilla Meresaniel Montravious Weigelt MD 03/02/2014 11:48 AM

## 2014-03-02 NOTE — Progress Notes (Addendum)
Amio gtt started as ordered. During bolus infusion, pt BP dropped to 70's systolic. Bolus stopped. BP remained low, rate dropped to 30mg /hr. Dr Gala Romney paged.  Dr Gala Romney notified of pressures. bolus started, new orders for levophed.

## 2014-03-02 NOTE — Progress Notes (Signed)
Amiodarone Drug - Drug Interaction Consult Note  Recommendations: Amiodarone is metabolized by the cytochrome P450 system and therefore has the potential to cause many drug interactions. Amiodarone has an average plasma half-life of 50 days (range 20 to 100 days). There is potential for drug interactions to occur several weeks or months after stopping treatment and the onset of drug interactions may be slow after initiating amiodarone.   Currently, patient is on Levaquin which may increase the risk of QTc prolongation especially when used concomitantly with amiodarone. Patient's QTc yesterday was documented at 546 so should continue to monitor and may need to consider alternative antibiotic option. Use with atorvastatin may increase the risk of myopathy so patient should be monitored for signs and symptoms. Will continue to monitor for interactions based on medication changes made.    Thank You,  Margie Billet, PharmD Clinical Pharmacist - Resident Pager: [phone] Pharmacy: [phone] 03/02/2014 1:01 PM

## 2014-03-02 NOTE — Progress Notes (Signed)
ANTICOAGULATION and ANTIBIOTIC CONSULT NOTE - Follow Up Consult  Pharmacy Consult for Heparin  Indication: chest pain/ACS   No Known Allergies  Patient Measurements: Height: 5\' 3"  (160 cm) Weight: 155 lb 10.3 oz (70.6 kg) IBW/kg (Calculated) : 52.4 Heparin Dosing Weight: 66.5 kg  Vital Signs: Temp: 101.3 F (38.5 C) (08/09 1800) BP: 108/52 mmHg (08/09 1532) Pulse Rate: 121 (08/09 1800)  Labs:  Recent Labs  02/22/2014 0050 02/26/2014 0330  03/07/2014 1011  03/24/2014 2000 03/18/2014 2200  03/01/14 0300 03/01/14 0400  03/01/14 0809 03/01/14 1151  03/01/14 2030 03/02/14 0053 03/02/14 0130 03/02/14 0456 03/02/14 0500 03/02/14 1805  HGB 11.7* 11.7*  < >  --   --   --   --   --  11.4*  --   --  11.6* 11.6*  --   --   --   --  9.5*  --   --   HCT 37.3 36.8  < >  --   --   --   --   --  34.1*  --   --  34.0* 34.0*  --   --   --   --  28.8*  --   --   PLT 349 397  --   --   --   --   --   --  366  --   --   --   --   --   --   --   --  261  --   --   APTT 33  --   --   --   --   --   --   --   --   --   --   --   --   --   --   --   --   --   --   --   LABPROT 15.9*  --   --  14.3  --   --   --   --   --   --   --   --   --   --   --   --   --   --   --   --   INR 1.27  --   --  1.11  --   --   --   --   --   --   --   --   --   --   --   --   --   --   --   --   HEPARINUNFRC  --   --   < > <0.10*  --  <0.10*  --   --  1.80*  --   --   --   --   < >  --   --  0.41  --  <0.10* 0.12*  CREATININE 1.32* 1.26*  < > 1.18*  < > 1.25*  --   < >  --   --   < > 1.40* 1.40*  < > 1.55* 1.71*  --  1.79*  --   --   TROPONINI  --   --   < > 1.38*  < > 1.42* 1.41*  --   --  1.40*  --   --   --   --   --   --   --   --   --   --   < > = values in this interval not displayed.  Estimated Creatinine Clearance:  26.4 ml/min (by C-G formula based on Cr of 1.79).   Medications:  Heparin @ 1050 units/hr  Assessment: 73 YOF admitted on 8/6 after suffering a cardiac arrest at home. The patient underwent  intubation and CPR and was started on the hypothermia protocol.   ANTICOAGULATION: Heparin started for NSTEMI. Cards considering cath if the patient has neurologic recovery. The patient was rewarmed earlier today and HL was undetectable. Heparin infusion rate was increased to 1050 units/hr. HL is now 0.12. No bleeding or issues noted per RN.   Goal of Therapy:  Heparin level 0.3-0.7 units/ml Monitor platelets by anticoagulation protocol: Yes   Plan:  - Increase heparin gtt to 1300 u/hr - F/u 8 hour HL - Daily HL, CBC, s/s bleeding - F/u plans for continuation and cath  Vinnie LevelBenjamin Domingos Riggi, PharmD.  Clinical Pharmacist Pager [phone]

## 2014-03-02 NOTE — Progress Notes (Signed)
Hypoglycemic Event  CBG: 48  Treatment: d50 73ml  Symptoms: none  Follow-up CBG: Time:214 CBG Result:0050  Possible Reasons for Event: NPO, impaired metabolism  related to hypothermia/hyperthermia Comments/MD notified protocol   Melanie Williamson, Melanie Williamson  Remember to initiate Hypoglycemia Order Set & complete

## 2014-03-02 NOTE — Progress Notes (Signed)
ANTICOAGULATION CONSULT NOTE  Pharmacy Consult for Heparin Indication: chest pain/ACS  No Known Allergies  Patient Measurements: Height: 5\' 3"  (160 cm) Weight: 155 lb 10.3 oz (70.6 kg) IBW/kg (Calculated) : 52.4 Heparin Dosing Weight: 66.5 kg  Vital Signs: Temp: 97.4 F (36.3 C) (08/09 0001) Temp src: Oral (08/09 0001) BP: 125/69 mmHg (08/08 1629) Pulse Rate: 117 (08/09 0100)  Labs:  Recent Labs  02/27/2014 0050 02/22/2014 0330  02/23/2014 1011  03/22/2014 2000 03/08/2014 2200  03/01/14 0300 03/01/14 0400  03/01/14 0809 03/01/14 1151 03/01/14 1256 03/01/14 1600 03/01/14 2030 03/02/14 0053 03/02/14 0130  HGB 11.7* 11.7*  < >  --   --   --   --   --  11.4*  --   --  11.6* 11.6*  --   --   --   --   --   HCT 37.3 36.8  < >  --   --   --   --   --  34.1*  --   --  34.0* 34.0*  --   --   --   --   --   PLT 349 397  --   --   --   --   --   --  366  --   --   --   --   --   --   --   --   --   APTT 33  --   --   --   --   --   --   --   --   --   --   --   --   --   --   --   --   --   LABPROT 15.9*  --   --  14.3  --   --   --   --   --   --   --   --   --   --   --   --   --   --   INR 1.27  --   --  1.11  --   --   --   --   --   --   --   --   --   --   --   --   --   --   HEPARINUNFRC  --   --   < > <0.10*  --  <0.10*  --   --  1.80*  --   --   --   --   --  0.73*  --   --  0.41  CREATININE 1.32* 1.26*  < > 1.18*  < > 1.25*  --   < >  --   --   < > 1.40* 1.40* 1.38*  --  1.55* 1.71*  --   TROPONINI  --   --   < > 1.38*  < > 1.42* 1.41*  --   --  1.40*  --   --   --   --   --   --   --   --   < > = values in this interval not displayed.  Estimated Creatinine Clearance: 27.6 ml/min (by C-G formula based on Cr of 1.71).  Assessment: 73 yo female s/p cardiac arrest/hypothermia protocol/NSTEMI, for heparin  Goal of Therapy:  Heparin level 0.3-0.7 units/ml Monitor platelets by anticoagulation protocol: Yes   Plan:  Continue Heparin at current rate  Geannie RisenGreg Brynlei Klausner, PharmD, BCPS  03/02/2014 3:03 AM

## 2014-03-02 NOTE — Progress Notes (Deleted)
Adult Hypoglycemia Protocol Treatment Guidelines  1. RN shall initiate Hypoglycemia Protocol emergency measures immediately when:            w        Routine or STAT CBG and/or a lab glucose indicates hypoglycemia (CBG < 70 mg/dl)  2. Treat the patient according to ability to take PO's and severity of hypoglycemia.   3. If patient is on GlucoStabilizer, follow directions provided by the GlucoStabilizer for hypoglycemic events.  4. If patient on insulin pump, follow Hypoglycemia Protocol.  If patient requires more than one treatment have patient place pump in SUSPEND and notify MD.  DO NOT leave pump in SUSPEND for greater than 30 minutes unless ordered by MD.  A. Treatment for Mild or Moderate-Patient cooperative and able to swallow    1.  Patient taking PO's and can cooperate   a.  Give one of the following 15 gram CHO options:                           w     1 tube oral dextrose gel                           w     3-4 Glucose tablets                           w     4 oz. Juice                           w     4 oz. regular soda                                    ESRD patients:  clear, regular soda                           w     8 oz. skim milk    b.  Recheck CBG in 15 minutes after treatment                            w       If CBG < 70 mg/dl, repeat treatment and recheck until hypoglycemia is resolved                            w       If CBG > 70 mg/dl and next meal is more than 1 hour away, give additional 15 grams CHO   2.  Patient NPO-Patient cooperative and no altered mental status    a.  Give 25 ml of D50 IV.   b.  Recheck CBG in 15 minutes after treatment.                             w         If CBG is less than 70 mg/dl, repeat treatment and recheck until hypoglycemia is resolved.   c.  Notify MD for further orders.             SPECIAL CONSIDERATIONS:    a.    If no IV access,                              w        [facility]                             w         Give 25 ml of D50 IV.    b.  If unable to gain IV access                             w          Give Glucagon IM:     i.  1 mg if patient weighs more than 45.5 kg     ii.  0.5 mg if patient weighs less than 45.5 kg   c.  Notify MD for further orders  B.  1.  Position patient on side   2Hypoglycemic Event  CBG: 48  Treatment: d50  Symptoms:none  Follow-up CBG: Time:214 CBG Result:0050  Possible Reasons for Event: NPO/ impaired metabolism  Comments/MD notified:protocol    Melanie Williamson, Melanie Williamson  Remember to initiate Hypoglycemia Order Set & complete 3.  Recheck CBG in 15 minutes.                    w      If CBG is less than 70 mg/dl, repeat treatment and recheck until hypoglycemia is resolved. Treatment for Severe-- Patient unconscious or unable to take PO's safely     4.  Notify MD for further orders.    SPECIAL CONSIDERATIONS:    a.  If no IV access                              w     Give Glucagon IM                                              i.  1 mg if patient weighs more than 45.5 kg                                             ii.  0.5 mg if patient weighs less than 45.5 kg                              w      Start IV of D5W at 50 ml/hr and give 50 ml D50 IV   b.  If no IV access and active seizure                               w       Call Rapid Response   c.  If unable to gain IV access, give Glucagon IM:  w          1 mg if patient weighs more than 45.5 kg                              w          0.5 mg if patient weighs less than 45.5 kg   d.  Notify MD for further orders.  C. Complete smart text progress note to document intervention and follow-up CBG   1. In [facility] patient chart, click on Notes (left side of screen)   2. Create Progress Note   3. Click on [employer].  In the Match box type "hypo" and enter    4. Double click on CHL IP HYPOGLYCEMIC EVENT and enter data   5. MD must be notified if patient is NPO or  experienced severe hypoglycemia

## 2014-03-02 NOTE — Progress Notes (Signed)
ANTICOAGULATION and ANTIBIOTIC CONSULT NOTE - Follow Up Consult  Pharmacy Consult for Heparin and Vancomycin/Zosyn Indication: chest pain/ACS and COPD exacerbation/PNA  No Known Allergies  Patient Measurements: Height: 5\' 3"  (160 cm) Weight: 155 lb 10.3 oz (70.6 kg) IBW/kg (Calculated) : 52.4 Heparin Dosing Weight: 66.5 kg  Vital Signs: Temp: 100.3 F (37.9 C) (08/09 0600) Temp src: Oral (08/09 0600) BP: 112/45 mmHg (08/09 0755) Pulse Rate: 118 (08/09 0755)  Labs:  Recent Labs  03/20/2014 0050 03/06/2014 0330  02/27/2014 1011  03/06/2014 2000  2200  03/01/14 0300 03/01/14 0400  03/01/14 0809 03/01/14 1151  03/01/14 1600 03/01/14 2030 03/02/14 0053 03/02/14 0130 03/02/14 0456 03/02/14 0500  HGB 11.7* 11.7*  < >  --   --   --   --   --  11.4*  --   --  11.6* 11.6*  --   --   --   --   --  9.5*  --   HCT 37.3 36.8  < >  --   --   --   --   --  34.1*  --   --  34.0* 34.0*  --   --   --   --   --  28.8*  --   PLT 349 397  --   --   --   --   --   --  366  --   --   --   --   --   --   --   --   --  261  --   APTT 33  --   --   --   --   --   --   --   --   --   --   --   --   --   --   --   --   --   --   --   LABPROT 15.9*  --   --  14.3  --   --   --   --   --   --   --   --   --   --   --   --   --   --   --   --   INR 1.27  --   --  1.11  --   --   --   --   --   --   --   --   --   --   --   --   --   --   --   --   HEPARINUNFRC  --   --   < > <0.10*  --  <0.10*  --   --  1.80*  --   --   --   --   --  0.73*  --   --  0.41  --  <0.10*  CREATININE 1.32* 1.26*  < > 1.18*  < > 1.25*  --   < >  --   --   < > 1.40* 1.40*  < >  --  1.55* 1.71*  --  1.79*  --   TROPONINI  --   --   < > 1.38*  < > 1.42* 1.41*  --   --  1.40*  --   --   --   --   --   --   --   --   --   --   < > = values in  this interval not displayed.  Estimated Creatinine Clearance: 26.4 ml/min (by C-G formula based on Cr of 1.79).   Medications:  Heparin @ 850 units/hr  Assessment: 73 YOF admitted  on 8/6 after suffering a cardiac arrest at home. The patient underwent intubation and CPR and was started on the hypothermia protocol.   ANTICOAGULATION: Heparin started for NSTEMI. Cards considering cath if the patient has neurologic recovery. The patient has now been rewarmed and HL is now undetectable. H/H has trended down (hgb 11.6>9.5), no bleeding or issues noted per RN.   ANTIBIOTIC: Patient started on IV abx for COPD exacerbation and concern for aspiration. Now on day #3 of Vanc/Zosyn/LVQ. Tmax 100.3, WBC elevated at 19 and renal function has declined (CrCl ~26 ml/min). Blood cultures are ngtd and urine culture is negative.   Goal of Therapy:  Heparin level 0.3-0.7 units/ml Monitor platelets by anticoagulation protocol: Yes   Plan:  - Increase heparin gtt to 1050 u/hr - F/u 8 hour HL - Daily HL, CBC, s/s bleeding - F/u plans for continuation and cath  - Decrease vancomycin dose to 750 mg IV q24h with change in renal function - Continue Zosyn 3.375 gm IV q8h (4hr infusion) -- may need to adjust dose if renal function continues to worsen - Continue LVQ 500 mg IV q48h per MD - Monitor renal function, temp, WBC, C&S, [facility] at [facility]  Margie Billet, PharmD Clinical Pharmacist - Resident Pager: [phone] Pharmacy: [phone] 03/02/2014 8:19 AM

## 2014-03-02 NOTE — Progress Notes (Signed)
Brief Nutrition Note  Consult received for enteral/tube feeding initiation and management.  Adult Enteral Nutrition Protocol initiated by MD. Full assessment to follow.  Admitting Dx: Cardiac arrest [427.5] NSTEMI (non-ST elevated myocardial infarction) [410.70] COPD with acute exacerbation [491.21] Glasgow coma scale total score 3-8 [780.01]  Body mass index is 27.58 kg/(m^2). Pt meets criteria for overweight based on current BMI.  Labs:   Recent Labs Lab 03/05/2014 1011  03/01/14 2030 03/02/14 0053 03/02/14 0456  NA 137  < > 133* 133* 134*  K 3.4*  < > 5.9* 5.6* 5.3  CL 105  < > 99 101 103  CO2 15*  < > 16* 16* 16*  BUN 27*  < > 29* 31* 33*  CREATININE 1.18*  < > 1.55* 1.71* 1.79*  CALCIUM 7.3*  < > 7.2* 7.2* 7.0*  MG 1.8  --   --   --  1.4*  GLUCOSE 317*  < > 86 49* 92  < > = values in this interval not displayed.  Loyce Dys, MS RD LDN Clinical Inpatient Dietitian Weekend/After hours pager: [phone]

## 2014-03-02 NOTE — Progress Notes (Signed)
Unable to elicit a clear gag and cough reflex but patient tries to sit up when suctioning and doing mouthcare

## 2014-03-02 NOTE — Progress Notes (Signed)
PULMONARY / CRITICAL CARE MEDICINE  Name: Melanie Williamson B Deterding MRN: [id number] DOB: 12/30/1940    ADMISSION DATE:  03/15/2014  REFERRING MD :  EDP  CHIEF COMPLAINT:  Cardiac arrest  INITIAL PRESENTATION: 73 yo with COPD (on nocturnal oxygen) admitted 8/6 after suffering a witnessed cardiac arrest at home. She was intubated in field and ROSC was achieved with 10 mins of CPR and epi x 3. Maximum total downtime 10-18 minutes based on EMS.  STUDIES:  8/6  CT head >>> nad 8/7  Echo >> EF 35 to 40%, mod MR 8/7  Doppler legs b/l >> no DVT  SIGNIFICANT EVENTS: 8/6  Admitted after cardiac arrest, hypothermia protocol started 8/8  Rewarming started at 6:30 am, off pressors  INTERVAL HISTORY:  Off pressors.  Tolerating pressure support.    VITAL SIGNS: Temp:  [92.8 F (33.8 C)-100.3 F (37.9 C)] 100.3 F (37.9 C) (08/09 0600) Pulse Rate:  [91-132] 118 (08/09 0755) Resp:  [13-32] 16 (08/09 0755) BP: (108-125)/(45-69) 112/45 mmHg (08/09 0755) SpO2:  [97 %-100 %] 99 % (08/09 0755) Arterial Line BP: (90-143)/(45-77) 122/52 mmHg (08/09 0600) FiO2 (%):  [40 %] 40 % (08/09 0755) Weight:  [155 lb 10.3 oz (70.6 kg)] 155 lb 10.3 oz (70.6 kg) (08/09 0100)  HEMODYNAMICS: CVP:  [5 mmHg-14 mmHg] 7 mmHg  VENTILATOR SETTINGS: Vent Mode:  [-] PSV;CPAP FiO2 (%):  [40 %] 40 % Set Rate:  [24 bmp] 24 bmp Vt Set:  [450 mL] 450 mL PEEP:  [8 cmH20] 8 cmH20 Pressure Support:  [5 cmH20] 5 cmH20 Plateau Pressure:  [17 cmH20] 17 cmH20  INTAKE / OUTPUT: Intake/Output     08/08 0701 - 08/09 0700 08/09 0701 - 08/10 0700   I.V. (mL/kg) 868.3 (12.3) 10 (0.1)   NG/GT     IV Piggyback 850    Total Intake(mL/kg) 1718.3 (24.3) 10 (0.1)   Urine (mL/kg/hr) 580 (0.3)    Total Output 580     Net +1138.3 +10          PHYSICAL EXAMINATION: General: no distress Neuro: comatose HEENT: ETT in place Cardiovascular: regular, 2/6 SM Lungs: no wheeze Abdomen:  Soft, nontender Musculoskeletal:  No edema Skin:  No  rash  LABS:  CBC  Recent Labs Lab 03/23/2014 0330  03/01/14 0300 03/01/14 0809 03/01/14 1151 03/02/14 0456  WBC 18.4*  --  17.2*  --   --  19.0*  HGB 11.7*  < > 11.4* 11.6* 11.6* 9.5*  HCT 36.8  < > 34.1* 34.0* 34.0* 28.8*  PLT 397  --  366  --   --  261  < > = values in this interval not displayed.  Coag's  Recent Labs Lab 03/10/2014 0050 02/26/2014 1011  APTT 33  --   INR 1.27 1.11   BMET  Recent Labs Lab 03/01/14 2030 03/02/14 0053 03/02/14 0456  NA 133* 133* 134*  K 5.9* 5.6* 5.3  CL 99 101 103  CO2 16* 16* 16*  BUN 29* 31* 33*  CREATININE 1.55* 1.71* 1.79*  GLUCOSE 86 49* 92   Electrolytes  Recent Labs Lab  1011  03/01/14 2030 03/02/14 0053 03/02/14 0456  CALCIUM 7.3*  < > 7.2* 7.2* 7.0*  MG 1.8  --   --   --  1.4*  < > = values in this interval not displayed.  Sepsis Markers  Recent Labs Lab 03/23/2014 0059 03/15/2014 1011 03/20/2014 1600 03/15/2014 2200 03/01/14 0400  LATICACIDVEN 8.15* 3.5* 3.0* 3.3* 2.8*  PROCALCITON  --  4.62  --   --  7.17   ABG  Recent Labs Lab 03/15/2014 0900 03/01/14 0921 03/02/14 0414  PHART 7.219* 7.402 7.308*  PCO2ART 37.6 25.3* 32.6*  PO2ART 183.0* 108.0* 127.0*   Cardiac Enzymes  Recent Labs Lab 03/01/2014 2000 03/02/2014 2200 03/01/14 0400  TROPONINI 1.42* 1.41* 1.40*   Glucose  Recent Labs Lab 03/01/14 1550 03/01/14 1934 03/02/14 0018 03/02/14 0047 03/02/14 0054 03/02/14 0208  GLUCAP 108* 84 48* >600* 214* 124*   IMAGING:  Dg Chest Port 1 View  03/02/2014   CLINICAL DATA:  Respiratory failure  EXAM: PORTABLE CHEST - 1 VIEW  COMPARISON:  02/27/2014 and prior chest radiographs  FINDINGS: Cardiomegaly again identified.  An endotracheal tube with tip 2.4 cm above the carina, right IJ central venous catheter with tip overlying mid SVC, and NG tube entering the stomach with tip off the field of view identified.  Near complete resolution of pulmonary edema identified.  Left lower lung  consolidations/atelectasis and small left pleural effusion noted.  There is no evidence of pneumothorax.  IMPRESSION: Near complete resolution of pulmonary edema.  Left lower lung consolidations/atelectasis and small left pleural effusion.  Support apparatus as described.   Electronically Signed   By: Laveda Abbe M.D.   On: 03/02/2014 07:40   ASSESSMENT / PLAN:  PULMONARY ETT 8/6 >>  A: Acute respiratory failure 2nd to AECOPD, aspiration PNA, and acute pulmonary edema. P:   Pressure support wean as tolerated >> mental status barrier to extubation trial F/u CXR Continue pulmicort Change to atrovent, brovana and prn xopenex Allow for some degree of permissive hypercapnia to avoid PEEPi  CARDIOVASCULAR Rt IJ CVL 8/7 >> Rt femoral Aline 8/7 >>  A:  PEA arrest with NSTEMI with acute systolic heart failure, likely secondary to respiratory compromise. Shock ( septic vs cardiogenic ) >> resolved 8/09. Sinus tachycardia. P:  Continue ASA, Lipitor Rate control per cardiology  RENAL A:   AKI. Non gap metabolic acidosis. P:   Monitor renal fx, urine outpt electrolytes Add HCO3 on 8/09  GASTROINTESTINAL A:   Nutrition. GIPx. P:   NPO Protonix Tube feeds while on vent  HEMATOLOGIC A:   VTE Px. Therapeutic heparinization for ACS. Anemia of critical illness. P:  Trend CBC Heparin per pharmacy  INFECTIOUS A:   Possible aspiration pneumonia P:   Day 3 vancomycin, zosyn, levaquin started 8/7 Blood cx 8/7 >>   ENDOCRINE A:   Hyperglycemia. P:   SSI  NEUROLOGIC A:   Acute encephalopathy with possible anoxia. P:   F/u CT head, EEG >> depending on results might need further evaluation by neurology  CC time 35 minutes.  Updated family at bedside.  Coralyn Helling, MD [facility] Pulmonary/Critical Care 03/02/2014, 9:36 AM Pager:  [phone] After 3pm call: [phone]

## 2014-03-02 NOTE — Progress Notes (Signed)
 Fent gtt and 10 mL Versed gtt wasted in sink with Fritzi Mandes, RN.

## 2014-03-03 ENCOUNTER — Inpatient Hospital Stay (HOSPITAL_COMMUNITY): Payer: Medicare Other

## 2014-03-03 DIAGNOSIS — I10 Essential (primary) hypertension: Secondary | ICD-10-CM

## 2014-03-03 DIAGNOSIS — R4020 Unspecified coma: Secondary | ICD-10-CM

## 2014-03-03 DIAGNOSIS — J441 Chronic obstructive pulmonary disease with (acute) exacerbation: Secondary | ICD-10-CM

## 2014-03-03 LAB — CBC
HEMATOCRIT: 25 % — AB (ref 36.0–46.0)
Hemoglobin: 8.6 g/dL — ABNORMAL LOW (ref 12.0–15.0)
MCH: 31.7 pg (ref 26.0–34.0)
MCHC: 34.4 g/dL (ref 30.0–36.0)
MCV: 92.3 fL (ref 78.0–100.0)
Platelets: 234 10*3/uL (ref 150–400)
RBC: 2.71 MIL/uL — ABNORMAL LOW (ref 3.87–5.11)
RDW: 13.6 % (ref 11.5–15.5)
WBC: 18.5 10*3/uL — AB (ref 4.0–10.5)

## 2014-03-03 LAB — POCT I-STAT 3, ART BLOOD GAS (G3+)
Acid-base deficit: 15 mmol/L — ABNORMAL HIGH (ref 0.0–2.0)
Bicarbonate: 10 mEq/L — ABNORMAL LOW (ref 20.0–24.0)
O2 Saturation: 98 %
PCO2 ART: 19.5 mmHg — AB (ref 35.0–45.0)
PH ART: 7.316 — AB (ref 7.350–7.450)
PO2 ART: 108 mmHg — AB (ref 80.0–100.0)
TCO2: 11 mmol/L (ref 0–100)

## 2014-03-03 LAB — BASIC METABOLIC PANEL
ANION GAP: 17 — AB (ref 5–15)
BUN: 38 mg/dL — ABNORMAL HIGH (ref 6–23)
CALCIUM: 6.6 mg/dL — AB (ref 8.4–10.5)
CO2: 15 meq/L — AB (ref 19–32)
Chloride: 101 mEq/L (ref 96–112)
Creatinine, Ser: 2 mg/dL — ABNORMAL HIGH (ref 0.50–1.10)
GFR calc Af Amer: 27 mL/min — ABNORMAL LOW (ref >90)
GFR calc non Af Amer: 24 mL/min — ABNORMAL LOW (ref >90)
Glucose, Bld: 117 mg/dL — ABNORMAL HIGH (ref 70–99)
Potassium: 4.3 mEq/L (ref 3.7–5.3)
SODIUM: 133 meq/L — AB (ref 137–147)

## 2014-03-03 LAB — GLUCOSE, CAPILLARY
GLUCOSE-CAPILLARY: 124 mg/dL — AB (ref 70–99)
GLUCOSE-CAPILLARY: 108 mg/dL — AB (ref 70–99)
GLUCOSE-CAPILLARY: 146 mg/dL — AB (ref 70–99)
GLUCOSE-CAPILLARY: 127 mg/dL — AB (ref 70–99)
GLUCOSE-CAPILLARY: 147 mg/dL — AB (ref 70–99)
Glucose-Capillary: 105 mg/dL — ABNORMAL HIGH (ref 70–99)

## 2014-03-03 LAB — LACTIC ACID, PLASMA: LACTIC ACID, VENOUS: 0.9 mmol/L (ref 0.5–2.2)

## 2014-03-03 LAB — HEPARIN LEVEL (UNFRACTIONATED): Heparin Unfractionated: 0.45 IU/mL (ref 0.30–0.70)

## 2014-03-03 MED ORDER — FUROSEMIDE 10 MG/ML IJ SOLN
40.0000 mg | Freq: Once | INTRAMUSCULAR | Status: AC
  Administered 2014-03-03: 40 mg via INTRAVENOUS

## 2014-03-03 MED ORDER — FUROSEMIDE 10 MG/ML IJ SOLN
INTRAMUSCULAR | Status: AC
  Filled 2014-03-03: qty 4

## 2014-03-03 MED ORDER — FUROSEMIDE 10 MG/ML IJ SOLN
INTRAMUSCULAR | Status: AC
Start: 2014-03-03 — End: 2014-03-03
  Administered 2014-03-03: 80 mg via INTRAVENOUS
  Filled 2014-03-03: qty 4

## 2014-03-03 MED ORDER — VITAL AF 1.2 CAL PO LIQD
1000.0000 mL | ORAL | Status: DC
  Administered 2014-03-03: 1000 mL
  Filled 2014-03-03 (×7): qty 1000

## 2014-03-03 MED ORDER — DILTIAZEM HCL 100 MG IV SOLR
5.0000 mg/h | INTRAVENOUS | Status: DC
  Administered 2014-03-03 – 2014-03-04 (×3): 5 mg/h via INTRAVENOUS
  Filled 2014-03-03: qty 100

## 2014-03-03 MED ORDER — FUROSEMIDE 10 MG/ML IJ SOLN
80.0000 mg | Freq: Once | INTRAMUSCULAR | Status: AC
  Administered 2014-03-03: 80 mg via INTRAVENOUS

## 2014-03-03 MED FILL — Albuterol Sulfate Soln Nebu 0.5% (5 MG/ML): RESPIRATORY_TRACT | Qty: 20 | Status: AC

## 2014-03-03 NOTE — Progress Notes (Signed)
Pt appears very restless, pink frothy sputum visible via ETT. CVP increased to 23 from 17. Notified Dr. Delford Field, new orders given for 80 mg of lasix. Will continue to monitor.

## 2014-03-03 NOTE — Progress Notes (Signed)
1500 : Pt noted with pink/ reddish frothy secretion via ETT. Dr Kendrick Fries paged awaiting response.  1510: CCM  Notified, new orders received . Lasix  40 mg given, will  Cont to monitor.

## 2014-03-03 NOTE — Care Management Note (Addendum)
    Page 1 of 1   03/13/2014     10:04:54 AM CARE MANAGEMENT NOTE 03/13/2014  Patient:  Melanie Williamson, Melanie Williamson   Account Number:  [id number]  Date Initiated:  03/03/2014  Documentation initiated by:  Junius Creamer  Subjective/Objective Assessment:   adm w cardiac arrest, on vent     Action/Plan:   lives w fam, pcp dr Warrick Parisian   Anticipated DC Date:     Anticipated DC Plan:           Choice offered to / List presented to:             Status of service:   Medicare Important Message given?  YES (If response is "NO", the following Medicare IM given date fields will be blank) Date Medicare IM given:  03/10/2014 Medicare IM given by:  Junius Creamer Date Additional Medicare IM given:  03/13/2014 Additional Medicare IM given by:  [facility] Kaylum Shrum  Discharge Disposition:    Per UR Regulation:  Reviewed for med. necessity/level of care/duration of stay  If discussed at Long Length of Stay Meetings, dates discussed:   03/06/2014  03/11/2014  03/13/2014    Comments:

## 2014-03-03 NOTE — Progress Notes (Signed)
PULMONARY / CRITICAL CARE MEDICINE  Name: Melanie Williamson MRN: [id number] DOB: Jan 04, 1941    ADMISSION DATE:  03/01/2014  REFERRING MD :  EDP  CHIEF COMPLAINT:  Cardiac arrest  INITIAL PRESENTATION: 73 yo with COPD (on nocturnal oxygen) admitted 8/6 after suffering a witnessed cardiac arrest at home. She was intubated in field and ROSC was achieved with 10 mins of CPR and epi x 3. Maximum total downtime 10-18 minutes based on EMS.  STUDIES:  8/6  CT head >>> nad 8/7  Echo >> EF 35 to 40%, mod MR 8/7  Doppler legs b/l >> no DVT 8/10 CT head > atrophy and small vessel disease, chronic lacunar infarcts stable, no intracranial abnormality  SIGNIFICANT EVENTS: 8/6  Admitted after cardiac arrest, hypothermia protocol started 8/8  Rewarming started at 6:30 am, off pressors  INTERVAL HISTORY:  Started tracking and following simple commands  VITAL SIGNS: Temp:  [97.9 F (36.6 C)-101.3 F (38.5 C)] 99.1 F (37.3 C) (08/10 1128) Pulse Rate:  [73-135] 101 (08/10 1128) Resp:  [18-35] 19 (08/10 1128) BP: (108-155)/(51-67) 109/51 mmHg (08/10 1128) SpO2:  [92 %-100 %] 96 % (08/10 1128) Arterial Line BP: (80-152)/(39-67) 116/50 mmHg (08/10 1100) FiO2 (%):  [40 %] 40 % (08/10 1140) Weight:  [70.3 kg (154 lb 15.7 oz)] 70.3 kg (154 lb 15.7 oz) (08/10 0126)  HEMODYNAMICS: CVP:  [9 mmHg] 9 mmHg  VENTILATOR SETTINGS: Vent Mode:  [-] PRVC FiO2 (%):  [40 %] 40 % Set Rate:  [20 bmp] 20 bmp Vt Set:  [450 mL] 450 mL PEEP:  [5 cmH20-8 cmH20] 5 cmH20 Plateau Pressure:  [18 cmH20] 18 cmH20  INTAKE / OUTPUT: Intake/Output     08/09 0701 - 08/10 0700 08/10 0701 - 08/11 0700   P.O. 240    I.V. (mL/kg) 1098.3 (15.6) 161.8 (2.3)   Other 36    NG/GT 472.7 260   IV Piggyback 650    Total Intake(mL/kg) 2497 (35.5) 421.8 (6)   Urine (mL/kg/hr) 410 (0.2)    Total Output 410     Net +2087 +421.8        Urine Occurrence 360 x    Stool Occurrence 3 x      PHYSICAL EXAMINATION:  Gen: awake on  vent HEENT: NCAT, ETT PULM: Wheezing bilaterally, tachypnic CV: RRR, no mgr AB: BS+, soft Ext: warm, no edema Neuro: awake on vent, tracks with eyes, doesn't follow commands or withdraw to pain for me  LABS:  CBC  Recent Labs Lab 03/01/14 0300  03/01/14 1151 03/02/14 0456 03/03/14 0450  WBC 17.2*  --   --  19.0* 18.5*  HGB 11.4*  < > 11.6* 9.5* 8.6*  HCT 34.1*  < > 34.0* 28.8* 25.0*  PLT 366  --   --  261 234  < > = values in this interval not displayed.  Coag's  Recent Labs Lab 03/15/2014 0050 02/23/2014 1011  APTT 33  --   INR 1.27 1.11   BMET  Recent Labs Lab 03/02/14 0053 03/02/14 0456 03/03/14 0450  NA 133* 134* 133*  K 5.6* 5.3 4.3  CL 101 103 101  CO2 16* 16* 15*  BUN 31* 33* 38*  CREATININE 1.71* 1.79* 2.00*  GLUCOSE 49* 92 117*   Electrolytes  Recent Labs Lab 03/12/2014 1011  03/02/14 0053 03/02/14 0456 03/03/14 0450  CALCIUM 7.3*  < > 7.2* 7.0* 6.6*  MG 1.8  --   --  1.4*  --   < > =  values in this interval not displayed.  Sepsis Markers  Recent Labs Lab 03/15/2014 0059 03/01/2014 1011 03/20/2014 1600 03/02/2014 2200 03/01/14 0400  LATICACIDVEN 8.15* 3.5* 3.0* 3.3* 2.8*  PROCALCITON  --  4.62  --   --  7.17   ABG  Recent Labs Lab 03/15/2014 0900 03/01/14 0921 03/02/14 0414  PHART 7.219* 7.402 7.308*  PCO2ART 37.6 25.3* 32.6*  PO2ART 183.0* 108.0* 127.0*   Cardiac Enzymes  Recent Labs Lab 03/23/2014 2000 03/10/2014 2200 03/01/14 0400  TROPONINI 1.42* 1.41* 1.40*   Glucose  Recent Labs Lab 03/02/14 1738 03/02/14 2002 03/02/14 2322 03/03/14 0344 03/03/14 0822 03/03/14 1127  GLUCAP 94 95 101* 105* 124* 108*   IMAGING:  Ct Head Wo Contrast  03/02/2014   CLINICAL DATA:  COPD. Cardiac arrest at home on 02/27/2014. Maximum down time 10-18 min.  EXAM: CT HEAD WITHOUT CONTRAST  TECHNIQUE: Contiguous axial images were obtained from the base of the skull through the vertex without intravenous contrast.  COMPARISON:  03/13/2014   FINDINGS: There is mild central and cortical atrophy. Periventricular white matter changes are consistent with small vessel disease and appear stable. Chronic lacunar infarcts are again identified in the basal ganglia bilaterally. No calvarial fracture. There is atherosclerotic calcification of the internal carotid arteries.  IMPRESSION: 1. Atrophy and small vessel disease. 2. 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 3.  No evidence for acute intracranial abnormality.   Electronically Signed   By: Rosalie Gums M.D.   On: 03/02/2014 17:23   Dg Chest Port 1 View  03/03/2014   CLINICAL DATA:  Followup of pneumonia.  EXAM: PORTABLE CHEST - 1 VIEW  COMPARISON:  03/02/2014  FINDINGS: Endotracheal tube 2.7 cm above carina. Nasogastric tube extends beyond the inferior aspect of the film. Right internal jugular line tip at low SVC.  Cardiomegaly accentuated by AP portable technique. Atherosclerosis in the transverse aorta. Mild to moderate left hemidiaphragm elevation. Small left pleural effusion. No pneumothorax. No congestive failure. Patchy left base airspace disease.  IMPRESSION: No significant change since the prior exam.  Small left pleural effusion with adjacent atelectasis or infection.   Electronically Signed   By: Jeronimo Greaves M.D.   On: 03/03/2014 07:39   Dg Chest Port 1 View  03/02/2014   CLINICAL DATA:  Respiratory failure  EXAM: PORTABLE CHEST - 1 VIEW  COMPARISON:  03/20/2014 and prior chest radiographs  FINDINGS: Cardiomegaly again identified.  An endotracheal tube with tip 2.4 cm above the carina, right IJ central venous catheter with tip overlying mid SVC, and NG tube entering the stomach with tip off the field of view identified.  Near complete resolution of pulmonary edema identified.  Left lower lung consolidations/atelectasis and small left pleural effusion noted.  There is no evidence of pneumothorax.  IMPRESSION: Near complete resolution of pulmonary edema.   Left lower lung consolidations/atelectasis and small left pleural effusion.  Support apparatus as described.   Electronically Signed   By: Laveda Abbe M.D.   On: 03/02/2014 07:40   ASSESSMENT / PLAN:  PULMONARY ETT 8/6 >>  A: Acute respiratory failure 2nd to AECOPD, aspiration PNA, and acute pulmonary edema. Tachypnea> anxiety? Metabolic  P:   Pressure support wean as tolerated >> mental status barrier to extubation trial F/u CXR Continue pulmicort Continue atrovent, brovana and prn xopenex Check abg  CARDIOVASCULAR Rt IJ CVL 8/7 >> Rt femoral Aline 8/7 >>  A:  PEA arrest with NSTEMI with acute systolic heart failure,  likely secondary to respiratory compromise. Shock (septic vs cardiogenic ) >> resolved 8/09. Sinus tachycardia. P:  Continue ASA, Lipitor Rate control per cardiology  RENAL A:   AKI. Metabolic acidosis. P:   Monitor renal fx, urine outpt electrolytes Continue bicarb oral Check ABG, lactic acid  GASTROINTESTINAL A:   Nutrition.  P:   NPO Protonix Tube feeds while on vent  HEMATOLOGIC A:   VTE Px. Therapeutic heparinization for ACS. Anemia of critical illness. P:  Trend CBC Heparin per pharmacy  INFECTIOUS A:   HCAP vs aspiration pneumonia Blood cx 8/7 >>  Resp urine culture 8/7 >> P:   Day 4 vancomycin, zosyn,  D/C levaquin  ENDOCRINE A:   Hyperglycemia. P:   SSI  NEUROLOGIC A:   Acute encephalopathy with possible anoxia, appears to track but doesn't follow commands P:   F/u EEG >> depending on results might need further evaluation by neurology Minimize sedating meds  CC time 35 minutes.  Heber CarolinaBrent Avie Checo, MD [place] [facility] Pager: [phone] Cell: [phone] If no response, call [phone]

## 2014-03-03 NOTE — Progress Notes (Signed)
ANTICOAGULATION CONSULT NOTE - Follow Up Consult  Pharmacy Consult for Heparin Indication: chest pain/ACS  No Known Allergies  Patient Measurements: Height: 5\' 3"  (160 cm) Weight: 154 lb 15.7 oz (70.3 kg) IBW/kg (Calculated) : 52.4 Heparin Dosing Weight: 66.5kg  Vital Signs: Temp: 99.1 F (37.3 C) (08/10 1128) Temp src: Core (Comment) (08/10 1128) BP: 109/51 mmHg (08/10 1128) Pulse Rate: 101 (08/10 1128)  Labs:  Recent Labs  2014/03/09 2000 03-09-2014 2200  03/01/14 0300 03/01/14 0400  03/01/14 1151  03/02/14 0053  03/02/14 0456 03/02/14 0500 03/02/14 1805 03/03/14 0450  HGB  --   --   --  11.4*  --   < > 11.6*  --   --   --  9.5*  --   --  8.6*  HCT  --   --   --  34.1*  --   < > 34.0*  --   --   --  28.8*  --   --  25.0*  PLT  --   --   --  366  --   --   --   --   --   --  261  --   --  234  HEPARINUNFRC <0.10*  --   --  1.80*  --   --   --   < >  --   < >  --  <0.10* 0.12* 0.45  CREATININE 1.25*  --   < >  --   --   < > 1.40*  < > 1.71*  --  1.79*  --   --  2.00*  TROPONINI 1.42* 1.41*  --   --  1.40*  --   --   --   --   --   --   --   --   --   < > = values in this interval not displayed.  Estimated Creatinine Clearance: 23.6 ml/min (by C-G formula based on Cr of 2).   Medications:  Heparin @ 1300 units/hr  Assessment: 73yof continues on heparin for NSTEMI. Heparin level is therapeutic. Hgb trending down but no bleeding reported. Platelets stable. Noted plan for cath if neurologic status and renal function improve.  Goal of Therapy:  Heparin level 0.3-0.7 units/ml Monitor platelets by anticoagulation protocol: Yes   Plan:  1) Continue heparin at 1300 units/hr 2) Follow up heparin level, CBC in AM  Fredrik Rigger 03/03/2014,12:46 PM

## 2014-03-03 NOTE — Progress Notes (Addendum)
SUBJECTIVE:  Intubated but awake   OBJECTIVE:   Vitals:   Filed Vitals:   03/03/14 0500 03/03/14 0600 03/03/14 0700 03/03/14 0810  BP:   154/67   Pulse: 106 107 113   Temp: 99 F (37.2 C) 99.1 F (37.3 C) 99.5 F (37.5 C)   TempSrc:      Resp: 23 27 25    Height:      Weight:      SpO2: 97% 97% 95% 98%   I&O's:   Intake/Output Summary (Last 24 hours) at 03/03/14 4098 Last data filed at 03/03/14 0700  Gross per 24 hour  Intake 2357.16 ml  Output    410 ml  Net 1947.16 ml   TELEMETRY: Reviewed telemetry pt in tachycardia ? Atrial flutter vx. Atrial tachycardia at 120bpm     PHYSICAL EXAM General: Well developed, well nourished, in no acute distress Head: Eyes PERRLA, No xanthomas.   Normal cephalic and atramatic  Lungs:   Clear bilaterally to auscultation anteriorly. Heart:   HRRR S1 S2 Pulses are 2+ & equal. Tachycardic Abdomen: Bowel sounds are positive, abdomen soft and non-tender without masses Extremities:   No clubbing, cyanosis or edema.  DP +1 Neuro: Awake and responding to some commands    LABS: Basic Metabolic Panel:  Recent Labs  11/91/47 1011  03/02/14 0456 03/03/14 0450  NA 137  < > 134* 133*  K 3.4*  < > 5.3 4.3  CL 105  < > 103 101  CO2 15*  < > 16* 15*  GLUCOSE 317*  < > 92 117*  BUN 27*  < > 33* 38*  CREATININE 1.18*  < > 1.79* 2.00*  CALCIUM 7.3*  < > 7.0* 6.6*  MG 1.8  --  1.4*  --   < > = values in this interval not displayed. Liver Function Tests:  Recent Labs  03/02/14 0456  AST 36  ALT 24  ALKPHOS 73  BILITOT 0.3  PROT 5.4*  ALBUMIN 2.1*   No results found for this basename: LIPASE, AMYLASE,  in the last 72 hours CBC:  Recent Labs  03/02/14 0456 03/03/14 0450  WBC 19.0* 18.5*  HGB 9.5* 8.6*  HCT 28.8* 25.0*  MCV 93.8 92.3  PLT 261 234   Cardiac Enzymes:  Recent Labs  03/07/2014 2000 03/08/2014 2200 03/01/14 0400  TROPONINI 1.42* 1.41* 1.40*   BNP: No components found with this basename: POCBNP,   D-Dimer: No results found for this basename: DDIMER,  in the last 72 hours Hemoglobin A1C: No results found for this basename: HGBA1C,  in the last 72 hours Fasting Lipid Panel: No results found for this basename: CHOL, HDL, LDLCALC, TRIG, CHOLHDL, LDLDIRECT,  in the last 72 hours Thyroid Function Tests: No results found for this basename: TSH, T4TOTAL, FREET3, T3FREE, THYROIDAB,  in the last 72 hours Anemia Panel: No results found for this basename: VITAMINB12, FOLATE, FERRITIN, TIBC, IRON, RETICCTPCT,  in the last 72 hours Coag Panel:   Lab Results  Component Value Date   INR 1.11 03/12/2014   INR 1.27 03/11/2014   INR 0.96 10/04/2013    RADIOLOGY: Ct Head Wo Contrast  03/02/2014   CLINICAL DATA:  COPD. Cardiac arrest at home on 02/27/2014. Maximum down time 10-18 min.  EXAM: CT HEAD WITHOUT CONTRAST  TECHNIQUE: Contiguous axial images were obtained from the base of the skull through the vertex without intravenous contrast.  COMPARISON:  03/10/2014  FINDINGS: There is mild central and cortical atrophy. Periventricular white  matter changes are consistent with small vessel disease and appear stable. Chronic lacunar infarcts are again identified in the basal ganglia bilaterally. No calvarial fracture. There is atherosclerotic calcification of the internal carotid arteries.  IMPRESSION: 1. Atrophy and small vessel disease. 2. 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 3.  No evidence for acute intracranial abnormality.   Electronically Signed   By: Rosalie GumsBeth  Brown M.D.   On: 03/02/2014 17:23   Ct Head Wo Contrast  02/27/2014   CLINICAL DATA:  Rule out bleed, post resuscitation.  EXAM: CT HEAD WITHOUT CONTRAST  TECHNIQUE: Contiguous axial images were obtained from the base of the skull through the vertex without intravenous contrast.  COMPARISON:  CT of the head report September 12, 1998 though images are not available for direct comparison.  FINDINGS: The ventricles  and sulci are normal for age. No intraparenchymal hemorrhage, mass effect nor midline shift. Confluent supratentorial white matter hypodensities are within normal range for patient's age and though non-specific suggest sequelae of chronic small vessel ischemic disease. No acute large vascular territory infarcts. Bilateral basal ganglia and thalamus lacunar infarcts.  No abnormal extra-axial fluid collections. Basal cisterns are patent. Severe calcific atherosclerosis of the carotid siphons and included vertebral arteries.  No skull fracture. The included ocular globes and orbital contents are non-suspicious. Sphenoid sinus air-fluid levels, paranasal sinus mucosal thickening. Mastoid air cells are well aerated.  IMPRESSION: No evidence of intracranial hemorrhage.  Involutional changes. Severe white matter changes suggest chronic small vessel ischemic disease with bilateral thalamus and basal ganglia lacunar infarcts.   Electronically Signed   By: Awilda Metroourtnay  Bloomer   On: 2014-01-05 03:10   Dg Chest Port 1 View  03/03/2014   CLINICAL DATA:  Followup of pneumonia.  EXAM: PORTABLE CHEST - 1 VIEW  COMPARISON:  03/02/2014  FINDINGS: Endotracheal tube 2.7 cm above carina. Nasogastric tube extends beyond the inferior aspect of the film. Right internal jugular line tip at low SVC.  Cardiomegaly accentuated by AP portable technique. Atherosclerosis in the transverse aorta. Mild to moderate left hemidiaphragm elevation. Small left pleural effusion. No pneumothorax. No congestive failure. Patchy left base airspace disease.  IMPRESSION: No significant change since the prior exam.  Small left pleural effusion with adjacent atelectasis or infection.   Electronically Signed   By: Jeronimo GreavesKyle  Talbot M.D.   On: 03/03/2014 07:39   Dg Chest Port 1 View  03/02/2014   CLINICAL DATA:  Respiratory failure  EXAM: PORTABLE CHEST - 1 VIEW  COMPARISON:  2014-01-05 and prior chest radiographs  FINDINGS: Cardiomegaly again identified.  An  endotracheal tube with tip 2.4 cm above the carina, right IJ central venous catheter with tip overlying mid SVC, and NG tube entering the stomach with tip off the field of view identified.  Near complete resolution of pulmonary edema identified.  Left lower lung consolidations/atelectasis and small left pleural effusion noted.  There is no evidence of pneumothorax.  IMPRESSION: Near complete resolution of pulmonary edema.  Left lower lung consolidations/atelectasis and small left pleural effusion.  Support apparatus as described.   Electronically Signed   By: Laveda AbbeJeff  Hu M.D.   On: 03/02/2014 07:40   Dg Chest Port 1 View  03/10/2014   CLINICAL DATA:  Status post central line placement.  EXAM: PORTABLE CHEST - 1 VIEW  COMPARISON:  Chest x-ray from the same day at 04:37 a.m.  FINDINGS: Stable heart size and upper mediastinal contours. Unchanged diffuse pulmonary edema. No increasing pleural fluid. No  appreciable pneumothorax.  New right IJ catheter, tip at the SVC. Unchanged and unremarkable positioning of endotracheal and orogastric tubes. Endotracheal tube ends between the clavicular heads and carina.  IMPRESSION: 1. New right IJ catheter, tip at the SVC.  No pneumothorax. 2. Unchanged pulmonary edema.   Electronically Signed   By: Tiburcio Pea M.D.   On: 03/23/2014 06:33   Dg Chest Port 1 View  03/13/2014   CLINICAL DATA:  Rule out pneumothorax.  History of hypertension.  EXAM: PORTABLE CHEST - 1 VIEW  COMPARISON:  Chest x-ray from the same day at 12:52 a.m.  FINDINGS: Unchanged borderline cardiomegaly. Stable aortic contours. The endotracheal tube ends between the clavicular heads and carina. The orogastric tube reaches the stomach. There is unchanged pulmonary edema. No evidence of effusion or pneumothorax.  IMPRESSION: 1. No evidence of pneumothorax. 2. Endotracheal and orogastric tubes remain in good position. 3. Unchanged pulmonary edema.   Electronically Signed   By: Tiburcio Pea M.D.   On: 02/27/2014  05:01   Dg Chest Port 1 View  02/27/2014   CLINICAL DATA:  Assess endotracheal tube.  Status post CPR.  EXAM: PORTABLE CHEST - 1 VIEW  COMPARISON:  Chest radiograph October 03, 2013  FINDINGS: Endotracheal tube tip projects 3.5 cm above the carina. Nasogastric tube side port projects in proximal stomach, distal tip not imaged. Cardiac silhouette appears mildly enlarged. Tortuous calcified aorta. Mediastinal silhouette is nonsuspicious. Increased lung volumes may reflect underlying COPD, unchanged. Diffuse interstitial prominence with patchy central alveolar airspace opacities. No pleural effusions. No pneumothorax.  Vascular calcifications projecting in the upper extremities. Osteopenia.  IMPRESSION: Endotracheal tube tip projects 3.5 cm above the carina, nasogastric tube past the proximal stomach.  Mild cardiomegaly, interstitial and alveolar airspace opacities suggesting pulmonary edema, less likely ARDS.   Electronically Signed   By: Awilda Metro   On: 03/07/2014 01:06   ASSESSMENT AND PLAN:  73 yo with history of COPD on home oxygen had PEA arrest yesterday, now intubated and s/p cooling  1. Cardiac arrest: PEA. Suspect primary respiratory arrest. Now rewarmed and off sedation.  Head CT yesterday with nothing acute.  She is awake and eyes are moving.  Appears to track some and move head towards you when speaking to her.  Currently getting set up for EEG. 2. Pulmonary: Severe COPD, suspect respiratory infection/COPD exacerbation is driving this. She is on vanco/Zosyn. PCT elevated at 7.2. PCCM managing  3. Hypotension: ?related to hypothermia versus septic shock. Now resolved and BP now on the high side.  On abx 4. Tachycardia; Appears to be AFL/a tach. 5. Elevated troponin: Mild TnI elevation to 1.4, steady. Suspect this is most likely demand ischemia related to PEA arrest/hypotension. Has baseline LBBB. Echo showed EF 35-40% with wall motion abnormalities, this is worse than prior (EF 50%). Could  be stunning related to arrest.  - Continue ASA, statin, heparin gtt.  - If she has neurologic recovery can consider cardiac cath  6. Hyponatremia/Hyperkalemia  7. A/C renal failure, stage IV - creatinine worsening most likely due to recent arrest  Continue amio for AFT/atach   Start IV Cardizem for tachycardia and BP control (she is off pressors and now BP running on high side).  Will avoid   BB secondary to severe COPD.  Continue ASA/statin/Heparin gtt The patient is critically ill with multiple organ systems failure and requires high complexity decision making for assessment and support, frequent evaluation and titration of therapies, application of advanced monitoring technologies and  extensive interpretation of multiple databases.  Critical Care Time devoted to patient care services described in this note is 35 Minutes.     Quintella Reichert, MD  03/03/2014  9:22 AM

## 2014-03-03 NOTE — Procedures (Signed)
ELECTROENCEPHALOGRAM REPORT  Date of Study: 03/03/2014  Patient's Name: Melanie Williamson MRN: [id number] Date of Birth: November 12, 1940  Referring Provider: Dr. Coralyn Helling  Clinical History: This is a 73 year old woman who had an out-of-hospital cardiac arrest, total downtime 10-18 minutes. She underwent hypothermia protocol and is now rewarmed. She remains intubated, off pressors and sedation, responding to name at time of the EEG  Medications: Aspirin, Lipitor, Levaquin, Zosyn, vancomycin, pantoprazole  Technical Summary: A multichannel digital EEG recording measured by the international 10-20 system with electrodes applied with paste and impedances below 5000 ohms performed as portable with EKG monitoring in an intubated patient.  Hyperventilation and photic stimulation were not performed.  The digital EEG was referentially recorded, reformatted, and digitally filtered in a variety of bipolar and referential montages for optimal display.   Description: The patient is intubated during the recording. She is off sedation and noted to respond to name but did not consistently follow commands.  During maximal wakefulness, there is a 6-7 Hz posterior dominant rhythm admixed with a large amount of diffuse 4-7 Hz theta and 2-3 Hz delta slowing of the background.  There are independent sharp waves seen over the posterior quadrant region, with phase reversal over O1 (left occipital), O2 and T6 (right occipital, posterior temporal) regions.  Normal sleep architecture is not seen.  There were no electrographic seizures seen.  EKG lead showed sinus tachycardia.  Impression: This awake EEG is abnormal due to the presence of: 1. Slowing of the posterior dominant rhythm 2. Moderate diffuse slowing of the waking background 3. Independent sharp waves over the posterior quadrant regions  Clinical Correlation of the above findings indicates diffuse cerebral dysfunction that is non-specific in etiology and can be  seen with hypoxic/ischemic injury, toxic/metabolic encephalopathies, or medication effect.  Sharp waves seen over the posterior quadrant regions indicate possible epileptogenic potential from the occipital and right posterior temporal regions.  There were no electrographic seizures seen in this study.  Patrcia Dolly, M.D.

## 2014-03-03 NOTE — Progress Notes (Signed)
NUTRITION FOLLOW UP  Intervention:   Initiate Vital AF 1.2 @ 40 ml/hr via OG tube and increase by 10 ml every 4 hours to goal rate of 55 ml/hr.   Tube feeding regimen provides 1584 kcal (98% of needs), 99 grams of protein (100% of needs), and 1071 ml of H2O.   RD will continue to monitor.  Nutrition Dx:   Inadequate oral intake related to inability to eat as evidenced by NPO.  Goal:   Pt to meet >/= 90% of their estimated nutrition needs   Monitor:   Weight trends, NPO, I/O's, vent settings, labs  Assessment:   73 yo with COPD (on nocturnal oxygen) admitted 8/6 after suffering a witnessed cardiac arrest at home. She was intubated in field and ROSC was achieved with 10 mins of CPR and epi x 3. Maximum total downtime 10-18 minutes based on EMS.  - Hypothermia protocol initiated 8/7. Pt is now re-warmed. - Pt with no signs of fat or muscle wasting.  Patient has OG tube in place with tip of tube in stomach. Vital HP is infusing @ 40 ml/hr. Tube feeding regimen currently providing 960 kcal, 84 grams protein, and 802 ml H2O.   Patient is currently intubated on ventilator support MV: 12.5 L/min Temp (24hrs), Avg:100 F (37.8 C), Min:97.9 F (36.6 C), Max:101.3 F (38.5 C)  Na 133 K 4.3 CBG's 94-124  Height: Ht Readings from Last 1 Encounters:  01/11/14 5\' 3"  (1.6 m)    Weight Status:   Wt Readings from Last 1 Encounters:  03/03/14 154 lb 15.7 oz (70.3 kg)    Re-estimated needs:  Kcal: 1615 Protein: 90-100 g Fluid: Per MD  Skin: Skin tears on both arms, wound on left leg  Diet Order: NPO   Intake/Output Summary (Last 24 hours) at 03/03/14 1045 Last data filed at 03/03/14 1000  Gross per 24 hour  Intake 2677.26 ml  Output    410 ml  Net 2267.26 ml    Last BM: 8/10, small and loose   Labs:   Recent Labs Lab 01/11/14 1011  03/02/14 0053 03/02/14 0456 03/03/14 0450  NA 137  < > 133* 134* 133*  K 3.4*  < > 5.6* 5.3 4.3  CL 105  < > 101 103 101  CO2  15*  < > 16* 16* 15*  BUN 27*  < > 31* 33* 38*  CREATININE 1.18*  < > 1.71* 1.79* 2.00*  CALCIUM 7.3*  < > 7.2* 7.0* 6.6*  MG 1.8  --   --  1.4*  --   GLUCOSE 317*  < > 49* 92 117*  < > = values in this interval not displayed.  CBG (last 3)   Recent Labs  03/02/14 2322 03/03/14 0344 03/03/14 0822  GLUCAP 101* 105* 124*    Scheduled Meds: . antiseptic oral rinse  7 mL Mouth Rinse QID  . arformoterol  15 mcg Nebulization Q12H  . aspirin  325 mg Oral Daily  . atorvastatin  40 mg Oral q1800  . budesonide (PULMICORT) nebulizer solution  0.5 mg Nebulization BID  . chlorhexidine  15 mL Mouth Rinse BID  . feeding supplement (VITAL HIGH PROTEIN)  1,000 mL Per Tube Q24H  . insulin aspart  0-15 Units Subcutaneous 6 times per day  . insulin glargine  20 Units Subcutaneous Q24H  . ipratropium  0.5 mg Nebulization Q12H  . levofloxacin (LEVAQUIN) IV  500 mg Intravenous Q48H  . pantoprazole sodium  40 mg Per Tube  Q24H  . piperacillin-tazobactam (ZOSYN)  IV  3.375 g Intravenous 3 times per day  . sodium bicarbonate  650 mg Per Tube TID  . vancomycin  750 mg Intravenous Q24H    Continuous Infusions: . sodium chloride Stopped (03/02/14 2200)  . amiodarone 30 mg/hr (03/03/14 0800)  . diltiazem (CARDIZEM) infusion 5 mg/hr (03/03/14 0942)  . heparin 13 Units (03/03/14 0800)  . norepinephrine (LEVOPHED) Adult infusion Stopped (03/03/14 0630)    Ebbie Latus RD, LDN

## 2014-03-03 NOTE — Progress Notes (Signed)
Bedside EEG completed, results pending. 

## 2014-03-04 ENCOUNTER — Inpatient Hospital Stay (HOSPITAL_COMMUNITY): Payer: Medicare Other

## 2014-03-04 DIAGNOSIS — E872 Acidosis, unspecified: Secondary | ICD-10-CM

## 2014-03-04 DIAGNOSIS — J411 Mucopurulent chronic bronchitis: Secondary | ICD-10-CM

## 2014-03-04 LAB — BASIC METABOLIC PANEL
ANION GAP: 20 — AB (ref 5–15)
Anion gap: 20 — ABNORMAL HIGH (ref 5–15)
BUN: 49 mg/dL — ABNORMAL HIGH (ref 6–23)
BUN: 52 mg/dL — AB (ref 6–23)
CALCIUM: 6.9 mg/dL — AB (ref 8.4–10.5)
CO2: 17 mEq/L — ABNORMAL LOW (ref 19–32)
CO2: 19 mEq/L (ref 19–32)
CREATININE: 2.34 mg/dL — AB (ref 0.50–1.10)
CREATININE: 2.55 mg/dL — AB (ref 0.50–1.10)
Calcium: 7.5 mg/dL — ABNORMAL LOW (ref 8.4–10.5)
Chloride: 99 mEq/L (ref 96–112)
Chloride: 100 mEq/L (ref 96–112)
GFR calc non Af Amer: 20 mL/min — ABNORMAL LOW (ref >90)
GFR calc non Af Amer: 18 mL/min — ABNORMAL LOW (ref >90)
GFR, EST AFRICAN AMERICAN: 23 mL/min — AB (ref >90)
GFR, EST AFRICAN AMERICAN: 20 mL/min — AB (ref >90)
Glucose, Bld: 134 mg/dL — ABNORMAL HIGH (ref 70–99)
Glucose, Bld: 96 mg/dL (ref 70–99)
Potassium: 3.2 mEq/L — ABNORMAL LOW (ref 3.7–5.3)
Potassium: 3.4 mEq/L — ABNORMAL LOW (ref 3.7–5.3)
Sodium: 136 mEq/L — ABNORMAL LOW (ref 137–147)
Sodium: 139 mEq/L (ref 137–147)

## 2014-03-04 LAB — BASIC METABOLIC PANEL WITH GFR
Anion gap: 16 — ABNORMAL HIGH (ref 5–15)
BUN: 53 mg/dL — ABNORMAL HIGH (ref 6–23)
CO2: 20 meq/L (ref 19–32)
Calcium: 7 mg/dL — ABNORMAL LOW (ref 8.4–10.5)
Chloride: 99 meq/L (ref 96–112)
Creatinine, Ser: 2.57 mg/dL — ABNORMAL HIGH (ref 0.50–1.10)
GFR calc Af Amer: 20 mL/min — ABNORMAL LOW
GFR calc non Af Amer: 17 mL/min — ABNORMAL LOW
Glucose, Bld: 131 mg/dL — ABNORMAL HIGH (ref 70–99)
Potassium: 3.5 meq/L — ABNORMAL LOW (ref 3.7–5.3)
Sodium: 135 meq/L — ABNORMAL LOW (ref 137–147)

## 2014-03-04 LAB — GLUCOSE, CAPILLARY
GLUCOSE-CAPILLARY: 97 mg/dL (ref 70–99)
GLUCOSE-CAPILLARY: 136 mg/dL — AB (ref 70–99)
GLUCOSE-CAPILLARY: 103 mg/dL — AB (ref 70–99)
Glucose-Capillary: 163 mg/dL — ABNORMAL HIGH (ref 70–99)
Glucose-Capillary: 86 mg/dL (ref 70–99)

## 2014-03-04 LAB — CBC
HCT: 25 % — ABNORMAL LOW (ref 36.0–46.0)
Hemoglobin: 8.6 g/dL — ABNORMAL LOW (ref 12.0–15.0)
MCH: 31.7 pg (ref 26.0–34.0)
MCHC: 34.4 g/dL (ref 30.0–36.0)
MCV: 92.3 fL (ref 78.0–100.0)
Platelets: 197 10*3/uL (ref 150–400)
RBC: 2.71 MIL/uL — ABNORMAL LOW (ref 3.87–5.11)
RDW: 13.6 % (ref 11.5–15.5)
WBC: 16.1 10*3/uL — ABNORMAL HIGH (ref 4.0–10.5)

## 2014-03-04 LAB — CREATININE, URINE, RANDOM: Creatinine, Urine: 16 mg/dL

## 2014-03-04 LAB — HEPARIN LEVEL (UNFRACTIONATED): HEPARIN UNFRACTIONATED: 0.51 [IU]/mL (ref 0.30–0.70)

## 2014-03-04 LAB — SODIUM, URINE, RANDOM: Sodium, Ur: 96 meq/L

## 2014-03-04 LAB — KETONES, QUALITATIVE: Acetone, Bld: NEGATIVE

## 2014-03-04 MED ORDER — ASPIRIN 81 MG PO CHEW
81.0000 mg | CHEWABLE_TABLET | Freq: Every day | ORAL | Status: DC
  Administered 2014-03-05 – 2014-03-08 (×4): 81 mg
  Filled 2014-03-04 (×5): qty 1

## 2014-03-04 MED ORDER — POTASSIUM CHLORIDE 20 MEQ/15ML (10%) PO LIQD: 40.0000 meq | Freq: Once | ORAL | Status: DC

## 2014-03-04 MED ORDER — FUROSEMIDE 10 MG/ML IJ SOLN
INTRAMUSCULAR | Status: AC
  Filled 2014-03-04: qty 8

## 2014-03-04 MED ORDER — FUROSEMIDE 10 MG/ML IJ SOLN
INTRAMUSCULAR | Status: AC
  Administered 2014-03-04: 60 mg via INTRAVENOUS
  Filled 2014-03-04: qty 8

## 2014-03-04 MED ORDER — POTASSIUM CHLORIDE 20 MEQ/15ML (10%) PO LIQD
ORAL | Status: AC
  Filled 2014-03-04: qty 15

## 2014-03-04 MED ORDER — FUROSEMIDE 10 MG/ML IJ SOLN
60.0000 mg | Freq: Once | INTRAMUSCULAR | Status: AC
  Administered 2014-03-04: 60 mg via INTRAVENOUS

## 2014-03-04 MED ORDER — PIPERACILLIN-TAZOBACTAM IN DEX 2-0.25 GM/50ML IV SOLN
2.2500 g | Freq: Four times a day (QID) | INTRAVENOUS | Status: DC
  Administered 2014-03-04 – 2014-03-09 (×19): 2.25 g via INTRAVENOUS
  Filled 2014-03-04 (×23): qty 50

## 2014-03-04 MED ORDER — POTASSIUM CHLORIDE 20 MEQ/15ML (10%) PO LIQD
20.0000 meq | Freq: Once | ORAL | Status: AC
  Administered 2014-03-04: 20 meq via ORAL

## 2014-03-04 NOTE — Progress Notes (Signed)
PULMONARY / CRITICAL CARE MEDICINE  Name: Melanie Williamson MRN: [id number] DOB: 04/08/1941    ADMISSION DATE:  03/03/2014  REFERRING MD :  EDP  CHIEF COMPLAINT:  Cardiac arrest  INITIAL PRESENTATION: 73 yo with COPD (on nocturnal oxygen) admitted 8/6 after suffering a witnessed cardiac arrest at home. She was intubated in field and ROSC was achieved with 10 mins of CPR and epi x 3. Maximum total downtime 10-18 minutes based on EMS.  STUDIES:  8/6  CT head >>> nad 8/7  Echo >> EF 35 to 40%, mod MR 8/7  Doppler legs b/l >> no DVT 8/10 CT head > atrophy and small vessel disease, chronic lacunar infarcts stable, no intracranial abnormality 8/10 EEG>>diffuse cerebral dysfunction that is non-specific in etiology and can be seen with hypoxic/ischemic injury, toxic/metabolic encephalopathies, or medication effect. Sharp waves seen over the posterior quadrant regions indicate possible epileptogenic potential from the occipital and right posterior temporal regions. There were no electrographic seizures seen in this study   SIGNIFICANT EVENTS: 8/6  Admitted after cardiac arrest, hypothermia protocol started 8/8  Rewarming started at 6:30 am, off pressors  INTERVAL HISTORY:  Nurse reports the patient is weaning well with no pressors overnight. Currently weaned off versed and on fentanyl at with slight agitation. Neurologically more intact with resolution of bloody sputum overnight with lasix.   VITAL SIGNS: Temp:  [98.6 F (37 C)-99.7 F (37.6 C)] 99 F (37.2 C) (08/11 0900) Pulse Rate:  [67-117] 83 (08/11 0900) Resp:  [18-33] 18 (08/11 0900) BP: (109)/(51) 109/51 mmHg (08/10 1128) SpO2:  [89 %-100 %] 100 % (08/11 0900) Arterial Line BP: (96-186)/(33-73) 98/33 mmHg (08/11 0900) FiO2 (%):  [40 %] 40 % (08/11 0825) Weight:  [159 lb 2.8 oz (72.2 kg)] 159 lb 2.8 oz (72.2 kg) (08/11 0108)  HEMODYNAMICS: CVP:  [11 mmHg-23 mmHg] 11 mmHg  VENTILATOR SETTINGS: Vent Mode:  [-] PSV;CPAP FiO2 (%):   [40 %] 40 % Set Rate:  [20 bmp] 20 bmp Vt Set:  [450 mL] 450 mL PEEP:  [5 cmH20] 5 cmH20 Pressure Support:  [12 cmH20] 12 cmH20 Plateau Pressure:  [22 cmH20-28 cmH20] 22 cmH20  INTAKE / OUTPUT: Intake/Output     08/10 0701 - 08/11 0700 08/11 0701 - 08/12 0700   P.O. 0    I.V. (mL/kg) 1055.8 (14.6) 89.4 (1.2)   Other     NG/GT 1202.8 110   IV Piggyback 100    Total Intake(mL/kg) 2358.6 (32.7) 199.4 (2.8)   Urine (mL/kg/hr) 2600 (1.5)    Stool 3 (0)    Total Output 2603     Net -244.4 +199.4        Stool Occurrence 1 x      PHYSICAL EXAMINATION:  Gen: awake on vent HEENT: NCAT, ETT PULM: Wheezing bilaterally, tachypnic CV: RRR, no mgr AB: BS+, soft Ext: warm, no edema Neuro: more awake, following commands  LABS:  CBC  Recent Labs Lab 03/02/14 0456 03/03/14 0450 03/04/14 0630  WBC 19.0* 18.5* 16.1*  HGB 9.5* 8.6* 8.6*  HCT 28.8* 25.0* 25.0*  PLT 261 234 197    Coag's  Recent Labs Lab 03/18/2014 0050 02/25/2014 1011  APTT 33  --   INR 1.27 1.11   BMET  Recent Labs Lab 03/02/14 0456 03/03/14 0450 03/04/14 0630  NA 134* 133* 136*  K 5.3 4.3 3.2*  CL 103 101 99  CO2 16* 15* 17*  BUN 33* 38* 49*  CREATININE 1.79* 2.00* 2.34*  GLUCOSE  92 117* 134*   Electrolytes  Recent Labs Lab 03/09/2014 1011  03/02/14 0456 03/03/14 0450 03/04/14 0630  CALCIUM 7.3*  < > 7.0* 6.6* 6.9*  MG 1.8  --  1.4*  --   --   < > = values in this interval not displayed.  Sepsis Markers  Recent Labs Lab 03/14/2014 0059 03/23/2014 1011  02/27/2014 2200 03/01/14 0400 03/03/14 1300  LATICACIDVEN 8.15* 3.5*  < > 3.3* 2.8* 0.9  PROCALCITON  --  4.62  --   --  7.17  --   < > = values in this interval not displayed. ABG  Recent Labs Lab 03/01/14 0921 03/02/14 0414 03/03/14 1434  PHART 7.402 7.308* 7.316*  PCO2ART 25.3* 32.6* 19.5*  PO2ART 108.0* 127.0* 108.0*   Cardiac Enzymes  Recent Labs Lab 03/09/2014 2000 03/01/2014 2200 03/01/14 0400  TROPONINI 1.42* 1.41*  1.40*   Glucose  Recent Labs Lab 03/03/14 1127 03/03/14 1555 03/03/14 1950 03/03/14 2326 03/04/14 0328 03/04/14 0737  GLUCAP 108* 146* 127* 147* 97 136*   IMAGING:  Ct Head Wo Contrast  03/02/2014   CLINICAL DATA:  COPD. Cardiac arrest at home on 02/27/2014. Maximum down time 10-18 min.  EXAM: CT HEAD WITHOUT CONTRAST  TECHNIQUE: Contiguous axial images were obtained from the base of the skull through the vertex without intravenous contrast.  COMPARISON:  02/27/2014  FINDINGS: There is mild central and cortical atrophy. Periventricular white matter changes are consistent with small vessel disease and appear stable. Chronic lacunar infarcts are again identified in the basal ganglia bilaterally. No calvarial fracture. There is atherosclerotic calcification of the internal carotid arteries.  IMPRESSION: 1. Atrophy and small vessel disease. 2. 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 3.  No evidence for acute intracranial abnormality.   Electronically Signed   By: Rosalie Gums M.D.   On: 03/02/2014 17:23   Dg Chest Port 1 View  03/03/2014   CLINICAL DATA:  Followup of pneumonia.  EXAM: PORTABLE CHEST - 1 VIEW  COMPARISON:  03/02/2014  FINDINGS: Endotracheal tube 2.7 cm above carina. Nasogastric tube extends beyond the inferior aspect of the film. Right internal jugular line tip at low SVC.  Cardiomegaly accentuated by AP portable technique. Atherosclerosis in the transverse aorta. Mild to moderate left hemidiaphragm elevation. Small left pleural effusion. No pneumothorax. No congestive failure. Patchy left base airspace disease.  IMPRESSION: No significant change since the prior exam.  Small left pleural effusion with adjacent atelectasis or infection.   Electronically Signed   By: Jeronimo Greaves M.D.   On: 03/03/2014 07:39   Dg Abd Portable 1v  03/03/2014   CLINICAL DATA:  Check OG tube position  EXAM: PORTABLE ABDOMEN - 1 VIEW  COMPARISON:  None.  FINDINGS: OG  tube tip is in the body or antrum of the stomach. Side port is in the body of the stomach.  Nonobstructive bowel gas pattern. No organomegaly or supine evidence of free air.  IMPRESSION: OG tube tip in the mid to distal stomach.   Electronically Signed   By: Charlett Nose M.D.   On: 03/03/2014 15:53   ASSESSMENT / PLAN:  PULMONARY ETT 8/6 >>  A: Acute respiratory failure 2nd to AECOPD, aspiration PNA, and acute pulmonary edema. Tachypnea> anxiety? Metabolic  P:   Pressure support wean as tolerated >> extubate today F/u CXR Continue pulmicort Continue atrovent, brovana and prn xopenex Check abg  CARDIOVASCULAR Rt IJ CVL 8/7 >> Rt femoral Aline 8/7 >>  A:  PEA arrest with NSTEMI with acute systolic heart failure, likely secondary to respiratory compromise. Shock (septic vs cardiogenic ) >> resolved 8/09. Sinus tachycardia. CVP increasing from 17 to 23 P:  Continue ASA, Lipitor Rate control per cardiology ( IV Cardizem, Amiodarone) Cardiology considering cardiac cath if neurologically stable Avoid BB secondarily to severe COPD Lasix   RENAL A:   AKI> worsening Metabolic acidosis > gap acidosis of uncertain etiology, lactate normal Hyponatremia Hypokalemia P:   Check urine lytes, serum ketones Monitor renal fx, urine outpt electrolytes Replacing K Continue lasix  GASTROINTESTINAL A:   Nutrition.  P:   NPO Protonix Tube feeds while on vent  HEMATOLOGIC A:   VTE Px. Therapeutic heparinization for ACS. Anemia of critical illness. P:  Trend CBC Heparin per pharmacy  INFECTIOUS A:   HCAP vs aspiration pneumonia Blood cx 8/7 >>  Resp urine culture 8/7 >> P:   Day 5 vancomycin, zosyn,  D/C vanc  ENDOCRINE A:   Hyperglycemia. P:   SSI  NEUROLOGIC A:   Acute encephalopathy with possible anoxia> improving -hypoxic vs toxic vs medication effect P:   Minimize sedating meds  Grandaughter updated at bedside  CC time 35 minutes.  Heber CarolinaBrent Tacara Hadlock,  MD [place] [facility] Pager: [phone] Cell: [phone] If no response, call [phone]

## 2014-03-04 NOTE — Procedures (Signed)
Extubation Procedure Note  Patient Details:   Name: Melanie Williamson DOB: [birth year] MRN: [id number]   Airway Documentation:     Evaluation  O2 sats: stable throughout and currently acceptable Complications: Complications of tachypnea. Patient did tolerate procedure well. Bilateral Breath Sounds: Coarse crackles Suctioning: Airway Yes  Prior to extubation: pt suctioned orally and via ETT.  Positive cuff leak noted.  Post-extubation: Pt able to cough to produce sputum, speak with good phonation, no stridor noted.    Immediately after extubation pt became very tachypneic.  Spo2 remained stable on 4LPM nasal cannula.  Pt placed on Bipap immediately and respiratory rate settled out to 23bpm.  Pt resting comfortably on BiPAP.  Dr. Kendrick Fries aware.  Antoine Poche 03/04/2014, 11:47 AM

## 2014-03-04 NOTE — Progress Notes (Signed)
ANTICOAGULATION/ANTIBIOTIC CONSULT NOTE - Follow Up Consult  Pharmacy Consult for Heparin and Zosyn Indication: chest pain/ACS and pneumonia  No Known Allergies  Patient Measurements: Height: 5\' 3"  (160 cm) Weight: 159 lb 2.8 oz (72.2 kg) IBW/kg (Calculated) : 52.4 Heparin Dosing Weight: 66.5kg  Vital Signs: Temp: 99.3 F (37.4 C) (08/11 1200) Temp src: Core (Comment) (08/11 1200) Pulse Rate: 85 (08/11 1200)  Labs:  Recent Labs  03/02/14 0456  03/02/14 1805 03/03/14 0450 03/04/14 0500 03/04/14 0630 03/04/14 1200  HGB 9.5*  --   --  8.6*  --  8.6*  --   HCT 28.8*  --   --  25.0*  --  25.0*  --   PLT 261  --   --  234  --  197  --   HEPARINUNFRC  --   < > 0.12* 0.45 0.51  --   --   CREATININE 1.79*  --   --  2.00*  --  2.34* 2.57*  < > = values in this interval not displayed.  Estimated Creatinine Clearance: 18.6 ml/min (by C-G formula based on Cr of 2.57).   Medications:  Heparin @ 1300 units/hr  Assessment: 73yof continues on heparin for NSTEMI. Heparin level is therapeutic. Hgb stabilizing, platelets stable, no bleeding reported. Noted plan for cath if neurologic status and renal function improve.  Also continues on day #5 antibiotics for HCAP vs aspiration pneumonia. Antibiotics narrowed to just zosyn. Renal function worsening so will need to adjust dose for CrCl < 5ml/min.  Goal of Therapy:  Heparin level 0.3-0.7 units/ml Monitor platelets by anticoagulation protocol: Yes   Plan:  1) Continue heparin at 1300 units/hr 2) Follow up heparin level, CBC in AM 3) Change zosyn to 2.25g IV q6  Fredrik Rigger 03/04/2014,2:22 PM

## 2014-03-04 NOTE — Progress Notes (Signed)
SUBJECTIVE:  Intubated  OBJECTIVE:   Vitals:   Filed Vitals:   03/04/14 0734 03/04/14 0800 03/04/14 0825 03/04/14 0900  BP:      Pulse: 96 96  83  Temp: 98.6 F (37 C) 98.6 F (37 C)  99 F (37.2 C)  TempSrc: Core (Comment)     Resp:  30  18  Height:      Weight:      SpO2: 97% 98% 98% 100%   I&O's:   Intake/Output Summary (Last 24 hours) at 03/04/14 0951 Last data filed at 03/04/14 0900  Gross per 24 hour  Intake 2398.63 ml  Output   2603 ml  Net -204.37 ml   TELEMETRY: Reviewed telemetry pt in NSR:     PHYSICAL EXAM General: Well developed, well nourished, in no acute distress Head: Eyes PERRLA, No xanthomas.   Normal cephalic and atramatic  Lungs:   Clear bilaterally to auscultation and percussion. Heart:   HRRR S1 S2 Pulses are 2+ & equal. Abdomen: Bowel sounds are positive, abdomen soft and non-tender without masses  Extremities:   No clubbing, cyanosis or edema.  DP +1 Neuro: Alert and oriented X 3. Psych:  Good affect, responds appropriately   LABS: Basic Metabolic Panel:  Recent Labs  16/10/96 0456 03/03/14 0450 03/04/14 0630  NA 134* 133* 136*  K 5.3 4.3 3.2*  CL 103 101 99  CO2 16* 15* 17*  GLUCOSE 92 117* 134*  BUN 33* 38* 49*  CREATININE 1.79* 2.00* 2.34*  CALCIUM 7.0* 6.6* 6.9*  MG 1.4*  --   --    Liver Function Tests:  Recent Labs  03/02/14 0456  AST 36  ALT 24  ALKPHOS 73  BILITOT 0.3  PROT 5.4*  ALBUMIN 2.1*   No results found for this basename: LIPASE, AMYLASE,  in the last 72 hours CBC:  Recent Labs  03/03/14 0450 03/04/14 0630  WBC 18.5* 16.1*  HGB 8.6* 8.6*  HCT 25.0* 25.0*  MCV 92.3 92.3  PLT 234 197   Cardiac Enzymes: No results found for this basename: CKTOTAL, CKMB, CKMBINDEX, TROPONINI,  in the last 72 hours BNP: No components found with this basename: POCBNP,  D-Dimer: No results found for this basename: DDIMER,  in the last 72 hours Hemoglobin A1C: No results found for this basename: HGBA1C,  in  the last 72 hours Fasting Lipid Panel: No results found for this basename: CHOL, HDL, LDLCALC, TRIG, CHOLHDL, LDLDIRECT,  in the last 72 hours Thyroid Function Tests: No results found for this basename: TSH, T4TOTAL, FREET3, T3FREE, THYROIDAB,  in the last 72 hours Anemia Panel: No results found for this basename: VITAMINB12, FOLATE, FERRITIN, TIBC, IRON, RETICCTPCT,  in the last 72 hours Coag Panel:   Lab Results  Component Value Date   INR 1.11 02/27/2014   INR 1.27 03/07/2014   INR 0.96 10/04/2013    RADIOLOGY: Ct Head Wo Contrast  03/02/2014   CLINICAL DATA:  COPD. Cardiac arrest at home on 02/27/2014. Maximum down time 10-18 min.  EXAM: CT HEAD WITHOUT CONTRAST  TECHNIQUE: Contiguous axial images were obtained from the base of the skull through the vertex without intravenous contrast.  COMPARISON:  03/24/2014  FINDINGS: There is mild central and cortical atrophy. Periventricular white matter changes are consistent with small vessel disease and appear stable. Chronic lacunar infarcts are again identified in the basal ganglia bilaterally. No calvarial fracture. There is atherosclerotic calcification of the internal carotid arteries.  IMPRESSION: 1. Atrophy and small vessel  disease. 2. 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 3.  No evidence for acute intracranial abnormality.   Electronically Signed   By: Rosalie GumsBeth  Brown M.D.   On: 03/02/2014 17:23   Ct Head Wo Contrast  03/09/2014   CLINICAL DATA:  Rule out bleed, post resuscitation.  EXAM: CT HEAD WITHOUT CONTRAST  TECHNIQUE: Contiguous axial images were obtained from the base of the skull through the vertex without intravenous contrast.  COMPARISON:  CT of the head report September 12, 1998 though images are not available for direct comparison.  FINDINGS: The ventricles and sulci are normal for age. No intraparenchymal hemorrhage, mass effect nor midline shift. Confluent supratentorial white matter hypodensities  are within normal range for patient's age and though non-specific suggest sequelae of chronic small vessel ischemic disease. No acute large vascular territory infarcts. Bilateral basal ganglia and thalamus lacunar infarcts.  No abnormal extra-axial fluid collections. Basal cisterns are patent. Severe calcific atherosclerosis of the carotid siphons and included vertebral arteries.  No skull fracture. The included ocular globes and orbital contents are non-suspicious. Sphenoid sinus air-fluid levels, paranasal sinus mucosal thickening. Mastoid air cells are well aerated.  IMPRESSION: No evidence of intracranial hemorrhage.  Involutional changes. Severe white matter changes suggest chronic small vessel ischemic disease with bilateral thalamus and basal ganglia lacunar infarcts.   Electronically Signed   By: Awilda Metroourtnay  Bloomer   On: 03/07/2014 03:10   Dg Chest Port 1 View  03/03/2014   CLINICAL DATA:  Followup of pneumonia.  EXAM: PORTABLE CHEST - 1 VIEW  COMPARISON:  03/02/2014  FINDINGS: Endotracheal tube 2.7 cm above carina. Nasogastric tube extends beyond the inferior aspect of the film. Right internal jugular line tip at low SVC.  Cardiomegaly accentuated by AP portable technique. Atherosclerosis in the transverse aorta. Mild to moderate left hemidiaphragm elevation. Small left pleural effusion. No pneumothorax. No congestive failure. Patchy left base airspace disease.  IMPRESSION: No significant change since the prior exam.  Small left pleural effusion with adjacent atelectasis or infection.   Electronically Signed   By: Jeronimo GreavesKyle  Talbot M.D.   On: 03/03/2014 07:39   Dg Chest Port 1 View  03/02/2014   CLINICAL DATA:  Respiratory failure  EXAM: PORTABLE CHEST - 1 VIEW  COMPARISON:  03/19/2014 and prior chest radiographs  FINDINGS: Cardiomegaly again identified.  An endotracheal tube with tip 2.4 cm above the carina, right IJ central venous catheter with tip overlying mid SVC, and NG tube entering the stomach with  tip off the field of view identified.  Near complete resolution of pulmonary edema identified.  Left lower lung consolidations/atelectasis and small left pleural effusion noted.  There is no evidence of pneumothorax.  IMPRESSION: Near complete resolution of pulmonary edema.  Left lower lung consolidations/atelectasis and small left pleural effusion.  Support apparatus as described.   Electronically Signed   By: Laveda AbbeJeff  Hu M.D.   On: 03/02/2014 07:40   Dg Chest Port 1 View  03/08/2014   CLINICAL DATA:  Status post central line placement.  EXAM: PORTABLE CHEST - 1 VIEW  COMPARISON:  Chest x-ray from the same day at 04:37 a.m.  FINDINGS: Stable heart size and upper mediastinal contours. Unchanged diffuse pulmonary edema. No increasing pleural fluid. No appreciable pneumothorax.  New right IJ catheter, tip at the SVC. Unchanged and unremarkable positioning of endotracheal and orogastric tubes. Endotracheal tube ends between the clavicular heads and carina.  IMPRESSION: 1. New right IJ catheter, tip at the SVC.  No pneumothorax. 2. Unchanged pulmonary edema.   Electronically Signed   By: Tiburcio Pea M.D.   On: Mar 12, 2014 06:33   Dg Chest Port 1 View  2014-03-12   CLINICAL DATA:  Rule out pneumothorax.  History of hypertension.  EXAM: PORTABLE CHEST - 1 VIEW  COMPARISON:  Chest x-ray from the same day at 12:52 a.m.  FINDINGS: Unchanged borderline cardiomegaly. Stable aortic contours. The endotracheal tube ends between the clavicular heads and carina. The orogastric tube reaches the stomach. There is unchanged pulmonary edema. No evidence of effusion or pneumothorax.  IMPRESSION: 1. No evidence of pneumothorax. 2. Endotracheal and orogastric tubes remain in good position. 3. Unchanged pulmonary edema.   Electronically Signed   By: Tiburcio Pea M.D.   On: 03-12-14 05:01   Dg Chest Port 1 View  Mar 12, 2014   CLINICAL DATA:  Assess endotracheal tube.  Status post CPR.  EXAM: PORTABLE CHEST - 1 VIEW  COMPARISON:   Chest radiograph October 03, 2013  FINDINGS: Endotracheal tube tip projects 3.5 cm above the carina. Nasogastric tube side port projects in proximal stomach, distal tip not imaged. Cardiac silhouette appears mildly enlarged. Tortuous calcified aorta. Mediastinal silhouette is nonsuspicious. Increased lung volumes may reflect underlying COPD, unchanged. Diffuse interstitial prominence with patchy central alveolar airspace opacities. No pleural effusions. No pneumothorax.  Vascular calcifications projecting in the upper extremities. Osteopenia.  IMPRESSION: Endotracheal tube tip projects 3.5 cm above the carina, nasogastric tube past the proximal stomach.  Mild cardiomegaly, interstitial and alveolar airspace opacities suggesting pulmonary edema, less likely ARDS.   Electronically Signed   By: Awilda Metro   On: 12-Mar-2014 01:06   Dg Abd Portable 1v  03/03/2014   CLINICAL DATA:  Check OG tube position  EXAM: PORTABLE ABDOMEN - 1 VIEW  COMPARISON:  None.  FINDINGS: OG tube tip is in the body or antrum of the stomach. Side port is in the body of the stomach.  Nonobstructive bowel gas pattern. No organomegaly or supine evidence of free air.  IMPRESSION: OG tube tip in the mid to distal stomach.   Electronically Signed   By: Charlett Nose M.D.   On: 03/03/2014 15:53    ASSESSMENT AND PLAN:  73 yo with history of COPD on home oxygen had PEA arrest yesterday, now intubated and s/p cooling  1. Cardiac arrest: PEA. Suspect primary respiratory arrest. Now rewarmed and off sedation. Head CT yesterday with nothing acute. She is awake and eyes are moving. EEG showed diffuse cerebral dysfunction that is nonspecific c/w hypoxic/ischemic injury 2. Pulmonary: Severe COPD, suspect respiratory infection/COPD exacerbation is driving this. She is on vanco/Zosyn. PCT elevated at 7.2. PCCM managing  3. Hypotension: ?related to hypothermia versus septic shock. Now improved. On abx  4. Tachycardia - now in NSR on IV Cardizem gtt   5. Elevated troponin: Mild TnI elevation to 1.4, steady. Suspect this is most likely demand ischemia related to PEA arrest/hypotension. Has baseline LBBB. Echo showed EF 35-40% with wall motion abnormalities, this is worse than prior (EF 50%). Could be stunning related to arrest.  - Continue ASA, statin, heparin gtt.  - If she has neurologic recovery can consider cardiac cath  6. Hyponatremia/Hypokalemia - replete potassium 7. A/C renal failure, stage IV - creatinine worsening most likely due to recent arrest   Continue amio for AFT/atach suppression  Continue IV Cardizem for tachycardia and BP control (BP elevated yesterday). Will avoid BB secondary to severe COPD.  Continue ASA/statin/Heparin gtt  Replete potassium  The patient is critically ill with multiple organ systems failure and requires high complexity decision making for assessment and support, frequent evaluation and titration of therapies, application of advanced monitoring technologies and extensive interpretation of multiple databases.   Critical Care Time devoted to patient care services described in this note is 35 Minutes.     Quintella Reichert, MD  03/04/2014  9:51 AM

## 2014-03-05 ENCOUNTER — Inpatient Hospital Stay (HOSPITAL_COMMUNITY): Payer: Medicare Other

## 2014-03-05 LAB — BASIC METABOLIC PANEL
Anion gap: 20 — ABNORMAL HIGH (ref 5–15)
Anion gap: 20 — ABNORMAL HIGH (ref 5–15)
BUN: 56 mg/dL — ABNORMAL HIGH (ref 6–23)
BUN: 56 mg/dL — ABNORMAL HIGH (ref 6–23)
CALCIUM: 8.3 mg/dL — AB (ref 8.4–10.5)
CHLORIDE: 99 meq/L (ref 96–112)
CO2: 21 mEq/L (ref 19–32)
CO2: 20 meq/L (ref 19–32)
CREATININE: 2.7 mg/dL — AB (ref 0.50–1.10)
Calcium: 7.7 mg/dL — ABNORMAL LOW (ref 8.4–10.5)
Chloride: 99 mEq/L (ref 96–112)
Creatinine, Ser: 2.79 mg/dL — ABNORMAL HIGH (ref 0.50–1.10)
GFR calc Af Amer: 19 mL/min — ABNORMAL LOW (ref >90)
GFR calc non Af Amer: 16 mL/min — ABNORMAL LOW (ref >90)
GFR, EST AFRICAN AMERICAN: 18 mL/min — AB (ref >90)
GFR, EST NON AFRICAN AMERICAN: 16 mL/min — AB (ref >90)
Glucose, Bld: 55 mg/dL — ABNORMAL LOW (ref 70–99)
Glucose, Bld: 157 mg/dL — ABNORMAL HIGH (ref 70–99)
POTASSIUM: 2.9 meq/L — AB (ref 3.7–5.3)
Potassium: 3.5 mEq/L — ABNORMAL LOW (ref 3.7–5.3)
SODIUM: 140 meq/L (ref 137–147)
Sodium: 139 mEq/L (ref 137–147)

## 2014-03-05 LAB — CBC
HCT: 22.6 % — ABNORMAL LOW (ref 36.0–46.0)
HEMOGLOBIN: 7.8 g/dL — AB (ref 12.0–15.0)
MCH: 30.8 pg (ref 26.0–34.0)
MCHC: 34.5 g/dL (ref 30.0–36.0)
MCV: 89.3 fL (ref 78.0–100.0)
Platelets: 199 10*3/uL (ref 150–400)
RBC: 2.53 MIL/uL — AB (ref 3.87–5.11)
RDW: 13.4 % (ref 11.5–15.5)
WBC: 12.1 10*3/uL — ABNORMAL HIGH (ref 4.0–10.5)

## 2014-03-05 LAB — GLUCOSE, CAPILLARY
GLUCOSE-CAPILLARY: 63 mg/dL — AB (ref 70–99)
GLUCOSE-CAPILLARY: 154 mg/dL — AB (ref 70–99)
GLUCOSE-CAPILLARY: 91 mg/dL (ref 70–99)
Glucose-Capillary: 113 mg/dL — ABNORMAL HIGH (ref 70–99)
Glucose-Capillary: 169 mg/dL — ABNORMAL HIGH (ref 70–99)
Glucose-Capillary: 72 mg/dL (ref 70–99)
Glucose-Capillary: 92 mg/dL (ref 70–99)

## 2014-03-05 LAB — PREPARE RBC (CROSSMATCH)

## 2014-03-05 LAB — ABO/RH: ABO/RH(D): A POS

## 2014-03-05 LAB — HEPARIN LEVEL (UNFRACTIONATED): Heparin Unfractionated: 0.52 IU/mL (ref 0.30–0.70)

## 2014-03-05 LAB — PRO B NATRIURETIC PEPTIDE: Pro B Natriuretic peptide (BNP): 38198 pg/mL — ABNORMAL HIGH (ref 0–125)

## 2014-03-05 MED ORDER — STARCH (THICKENING) PO POWD
ORAL | Status: DC | PRN
  Filled 2014-03-05: qty 227

## 2014-03-05 MED ORDER — DEXTROSE 50 % IV SOLN
INTRAVENOUS | Status: AC
  Filled 2014-03-05: qty 50

## 2014-03-05 MED ORDER — AMIODARONE HCL 200 MG PO TABS
400.0000 mg | ORAL_TABLET | Freq: Two times a day (BID) | ORAL | Status: DC
  Filled 2014-03-05: qty 2

## 2014-03-05 MED ORDER — POTASSIUM CHLORIDE 10 MEQ/50ML IV SOLN
10.0000 meq | INTRAVENOUS | Status: AC
  Administered 2014-03-05 (×4): 10 meq via INTRAVENOUS
  Filled 2014-03-05 (×4): qty 50

## 2014-03-05 MED ORDER — PANTOPRAZOLE SODIUM 40 MG PO TBEC
40.0000 mg | DELAYED_RELEASE_TABLET | Freq: Every day | ORAL | Status: DC
  Administered 2014-03-05 – 2014-03-08 (×4): 40 mg via ORAL
  Filled 2014-03-05 (×5): qty 1

## 2014-03-05 MED ORDER — SODIUM CHLORIDE 0.9 % IV SOLN
Freq: Once | INTRAVENOUS | Status: AC
  Administered 2014-03-05: 12:00:00 via INTRAVENOUS

## 2014-03-05 MED ORDER — POTASSIUM CHLORIDE 20 MEQ/15ML (10%) PO LIQD: 20.0000 meq | Freq: Once | ORAL | Status: DC

## 2014-03-05 MED ORDER — AMIODARONE HCL 200 MG PO TABS
400.0000 mg | ORAL_TABLET | Freq: Two times a day (BID) | ORAL | Status: DC
  Administered 2014-03-05 – 2014-03-07 (×5): 400 mg via ORAL
  Filled 2014-03-05 (×7): qty 2

## 2014-03-05 MED ORDER — ACETAMINOPHEN 325 MG PO TABS
650.0000 mg | ORAL_TABLET | Freq: Four times a day (QID) | ORAL | Status: DC | PRN
  Administered 2014-03-05 – 2014-03-13 (×3): 650 mg via ORAL
  Filled 2014-03-05 (×3): qty 2

## 2014-03-05 MED ORDER — FUROSEMIDE 10 MG/ML IJ SOLN
40.0000 mg | Freq: Once | INTRAMUSCULAR | Status: AC
  Administered 2014-03-05: 40 mg via INTRAVENOUS
  Filled 2014-03-05: qty 4

## 2014-03-05 MED ORDER — DILTIAZEM HCL 30 MG PO TABS
30.0000 mg | ORAL_TABLET | Freq: Four times a day (QID) | ORAL | Status: DC
Start: 2014-03-05 — End: 2014-03-09
  Administered 2014-03-05 – 2014-03-08 (×15): 30 mg via ORAL
  Filled 2014-03-05 (×19): qty 1

## 2014-03-05 MED ORDER — ACETAMINOPHEN 325 MG PO TABS: 650.0000 mg | ORAL_TABLET | Freq: Four times a day (QID) | ORAL | Status: DC | PRN

## 2014-03-05 MED ORDER — DEXTROSE 50 % IV SOLN
25.0000 mL | Freq: Once | INTRAVENOUS | Status: AC
  Administered 2014-03-05: 25 mL via INTRAVENOUS

## 2014-03-05 MED ORDER — POTASSIUM CHLORIDE CRYS ER 20 MEQ PO TBCR
40.0000 meq | EXTENDED_RELEASE_TABLET | Freq: Once | ORAL | Status: AC
  Administered 2014-03-05: 40 meq via ORAL
  Filled 2014-03-05: qty 2

## 2014-03-05 NOTE — Progress Notes (Signed)
eLink Physician-Brief Progress Note Patient Name: Melanie Williamson DOB: 10/18/1940 MRN: [id number]   Date of Service  03/05/2014  HPI/Events of Note   Potassium 3.5.   eICU Interventions   Will give 40 meq KCL po x one      Intervention Category Major Interventions: Other:  Gianlucas Evenson 03/05/2014, 6:31 PM

## 2014-03-05 NOTE — Evaluation (Signed)
Clinical/Bedside Swallow Evaluation Patient Details  Name: Melanie Williamson MRN: [id number] Date of Birth: [birth year]  Today's Date: 03/05/2014 Time: 4327-6147 SLP Time Calculation (min): 16 min  Past Medical History:  Past Medical History  Diagnosis Date  . Hypertension   . Hyperlipidemia 07/17/2011   Past Surgical History: History reviewed. No pertinent past surgical history. HPI:  73 year old female with PMH: HTN, hyperlipidenia admitted with respiratory disress and pt. found to be in PEA with CPR initiated.  CXR Left lower lung consolidations/atelectasis and small left pleural effusion. Found to have severe COPD.  INtubated 8/7-8/11.  ST ordered for ability to initiate po's.   Assessment / Plan / Recommendation Clinical Impression  Subtle inidcations of decreased laryngeal protection evidenced by throat clears and delayed cough with cup/straw sips thin.  Oral transit delays likely due to overall weakness.  Pt. has history of COPD in addition to recent intubation (acute/reversible dysfunction), therefore recommend nectar liquids and Dys 2 diet texture with feeding and compensatory assist.  SLP will follow and likely able to return to upgrade liquids and solids soon.    Aspiration Risk  Moderate    Diet Recommendation Dysphagia 2 (Fine chop);Nectar-thick liquid   Liquid Administration via: Cup;No straw Medication Administration: Whole meds with puree Supervision: Patient able to self feed;Staff to assist with self feeding;Full supervision/cueing for compensatory strategies Compensations: Slow rate;Small sips/bites Postural Changes and/or Swallow Maneuvers: Seated upright 90 degrees    Other  Recommendations Oral Care Recommendations: Oral care BID   Follow Up Recommendations   (TBD)    Frequency and Duration min 2x/week  2 weeks   Pertinent Vitals/Pain WDL      Swallow Study         Oral/Motor/Sensory Function Overall Oral Motor/Sensory Function: Appears within functional  limits for tasks assessed   Ice Chips Ice chips: Not tested   Thin Liquid Thin Liquid: Impaired Presentation: Cup;Straw Pharyngeal  Phase Impairments: Suspected delayed Swallow;Cough - Delayed;Throat Clearing - Immediate    Nectar Thick Nectar Thick Liquid: Impaired Presentation: Cup Pharyngeal Phase Impairments: Suspected delayed Swallow   Honey Thick Honey Thick Liquid: Not tested   Puree Puree: Impaired Presentation: Spoon Pharyngeal Phase Impairments: Suspected delayed Swallow   Solid       Solid: Impaired Oral Phase Impairments: Impaired anterior to posterior transit Oral Phase Functional Implications:  (delayed transit)       [name] M.Ed [employer] [phone]  03/05/2014

## 2014-03-05 NOTE — Progress Notes (Signed)
PULMONARY / CRITICAL CARE MEDICINE  Name: Melanie Williamson MRN: [id number] DOB: 11/21/1940    ADMISSION DATE:  02/26/2014  REFERRING MD :  EDP  CHIEF COMPLAINT:  Cardiac arrest  INITIAL PRESENTATION: 73 yo with COPD (on nocturnal oxygen) admitted 8/6 after suffering a witnessed cardiac arrest at home. She was intubated in field and ROSC was achieved with 10 mins of CPR and epi x 3. Maximum total downtime 10-18 minutes based on EMS.  STUDIES:  8/6  CT head >>> nad 8/7  Echo >> EF 35 to 40%, mod MR 8/7  Doppler legs b/l >> no DVT 8/10 CT head > atrophy and small vessel disease, chronic lacunar infarcts stable, no intracranial abnormality 8/10 EEG>>diffuse cerebral dysfunction that is non-specific in etiology and can be seen with hypoxic/ischemic injury, toxic/metabolic encephalopathies, or medication effect. Sharp waves seen over the posterior quadrant regions indicate possible epileptogenic potential from the occipital and right posterior temporal regions. There were no electrographic seizures seen in this study 8/11 renal ultrasound > no hydro  SIGNIFICANT EVENTS: 8/6  Admitted after cardiac arrest, hypothermia protocol started 8/8  Rewarming started at 6:30 am, off pressors 8/11 extubated, briefly required BIPAP for pulm edema  INTERVAL HISTORY:  Extubated yesterday, briefly required BIPAP for pulm edema, now more awake, alert  VITAL SIGNS: Temp:  [95 F (35 C)-99.7 F (37.6 C)] 97.5 F (36.4 C) (08/12 0500) Pulse Rate:  [63-105] 72 (08/12 0500) Resp:  [13-30] 19 (08/12 0500) SpO2:  [91 %-100 %] 92 % (08/12 0500) Arterial Line BP: (98-164)/(33-60) 131/44 mmHg (08/12 0500) FiO2 (%):  [40 %] 40 % (08/11 1200) Weight:  [68.7 kg (151 lb 7.3 oz)] 68.7 kg (151 lb 7.3 oz) (08/12 0500)  HEMODYNAMICS:    VENTILATOR SETTINGS: Vent Mode:  [-] PCV;BIPAP FiO2 (%):  [40 %] 40 % Set Rate:  [15 bmp] 15 bmp PEEP:  [5 cmH20] 5 cmH20 Pressure Support:  [12 cmH20] 12 cmH20  INTAKE /  OUTPUT: Intake/Output     08/11 0701 - 08/12 0700   I.V. (mL/kg) 983.4 (14.3)   NG/GT 275   IV Piggyback 100   Total Intake(mL/kg) 1358.4 (19.8)   Urine (mL/kg/hr) 4375 (2.7)   Total Output 4375   Net -3016.6       Stool Occurrence 1 x     PHYSICAL EXAMINATION:  Gen: awake, alert HEENT: NCAT, OP clear PULM: few rhonchi, breathing comfortably CV: RRR, no mgr AB: BS+, soft Ext: warm, no edema Neuro: awake, oriented to [place], some slurred speech, maew and follows commands  LABS:  CBC  Recent Labs Lab 03/03/14 0450 03/04/14 0630 03/05/14 0340  WBC 18.5* 16.1* 12.1*  HGB 8.6* 8.6* 7.8*  HCT 25.0* 25.0* 22.6*  PLT 234 197 199    Coag's  Recent Labs Lab 03/21/2014 0050  1011  APTT 33  --   INR 1.27 1.11   BMET  Recent Labs Lab 03/04/14 1200 03/04/14 1800 03/05/14 0340  NA 135* 139 140  K 3.5* 3.4* 2.9*  CL 99 100 99  CO2 20 19 21   BUN 53* 52* 56*  CREATININE 2.57* 2.55* 2.79*  GLUCOSE 131* 96 55*   Electrolytes  Recent Labs Lab 03/11/2014 1011  03/02/14 0456  03/04/14 1200 03/04/14 1800 03/05/14 0340  CALCIUM 7.3*  < > 7.0*  < > 7.0* 7.5* 7.7*  MG 1.8  --  1.4*  --   --   --   --   < > = values in  this interval not displayed.  Sepsis Markers  Recent Labs Lab 03/15/2014 0059 03/19/2014 1011  03/09/2014 2200 03/01/14 0400 03/03/14 1300  LATICACIDVEN 8.15* 3.5*  < > 3.3* 2.8* 0.9  PROCALCITON  --  4.62  --   --  7.17  --   < > = values in this interval not displayed. ABG  Recent Labs Lab 03/01/14 0921 03/02/14 0414 03/03/14 1434  PHART 7.402 7.308* 7.316*  PCO2ART 25.3* 32.6* 19.5*  PO2ART 108.0* 127.0* 108.0*   Cardiac Enzymes  Recent Labs Lab 03/10/2014 2000 03/20/2014 2200 03/01/14 0400  TROPONINI 1.42* 1.41* 1.40*   Glucose  Recent Labs Lab 03/04/14 1108 03/04/14 1534 03/04/14 2006 03/05/14 0009 03/05/14 0519 03/05/14 0542  GLUCAP 163* 103* 86 72 63* 154*   IMAGING:  US Renal Port  03/04/2014    CLINICAL DATA:  Elevated renal function tests  EXAM: RENAL/URINARY TRACT ULTRASOUND COMPLETE  COMPARISON:  None.  FINDINGS: Right Kidney:  Length: 10.7 cm. Echogenicity within normal limits. No mass or hydronephrosis visualized.  Left Kidney:  Length: 9.3 cm. Echogenicity within normal limits. No mass or hydronephrosis visualized.  Bladder:  Decompressed by Foley catheter.  IMPRESSION: Unremarkable renal ultrasound.  No hydronephrosis is noted.   Electronically Signed   By: Alcide Clever M.D.   On: 03/04/2014 16:29   Dg Abd Portable 1v  03/03/2014   CLINICAL DATA:  Check OG tube position  EXAM: PORTABLE ABDOMEN - 1 VIEW  COMPARISON:  None.  FINDINGS: OG tube tip is in the body or antrum of the stomach. Side port is in the body of the stomach.  Nonobstructive bowel gas pattern. No organomegaly or supine evidence of free air.  IMPRESSION: OG tube tip in the mid to distal stomach.   Electronically Signed   By: Charlett Nose M.D.   On: 03/03/2014 15:53   ASSESSMENT / PLAN:  PULMONARY ETT 8/6 >>  A: Acute respiratory failure 2nd to AECOPD, aspiration PNA, and acute pulmonary edema. Tachypnea> anxiety? Metabolic  P:   Incentive spirometry Flutter OOB Continue pulmicort Continue atrovent, brovana and prn xopenex O2 as needed for O2 saturation > 92%  CARDIOVASCULAR Rt IJ CVL 8/7 >> Rt femoral Aline 8/7 >> A:  PEA arrest with NSTEMI with acute systolic heart failure, likely secondary to respiratory compromise. Shock (septic vs cardiogenic ) >> resolved 8/09. Sinus tachycardia. P:  Continue ASA, Lipitor Rate control per cardiology ( IV Cardizem, Amiodarone) LHC when renal function improves Lasix x1 today  RENAL A:   AKI> ATN? hopefully Cr starting to level off, UOP great; high FENA likely related to lasix Metabolic acidosis > stable Hypokalemia P:   Monitor renal fx, urine outpt electrolytes Replacing K, repeat BMET  GASTROINTESTINAL A:   Nutrition.  P:   SLP eval Protonix May  need feeding tube if can't swallow  HEMATOLOGIC A:   VTE Px. Therapeutic heparinization for ACS. Anemia of critical illness. P:  Monitor CBC Transfuse for Hgb < 8 given ACS Heparin per pharmacy  INFECTIOUS A:   HCAP vs aspiration pneumonia Blood cx 8/7 >>  Resp urine culture 8/7 >> P:   Day 5 vancomycin, zosyn,  D/C vanc  ENDOCRINE A:   Hyperglycemia. P:   SSI  NEUROLOGIC A:   Acute encephalopathy with possible anoxia> improving daily Aphasia? Intermittent, worrisome for watershed stroke  P:   Needs MRI at some point Consult neurology today   Dispo: would keep in ICU today  Heber Bowie, MD  PCCM Pager: [phone]  Cell: [phone] If no response, call [phone]

## 2014-03-05 NOTE — Progress Notes (Addendum)
SUBJECTIVE:  No complaints  OBJECTIVE:   Vitals:   Filed Vitals:   03/05/14 0400 03/05/14 0500 03/05/14 0600 03/05/14 0700  BP:      Pulse: 67 72 63 66  Temp: 97.9 F (36.6 C) 97.5 F (36.4 C) 97 F (36.1 C) 97.3 F (36.3 C)  TempSrc: Core (Comment) Oral    Resp: 14 19 14 18   Height:      Weight:  151 lb 7.3 oz (68.7 kg)    SpO2: 94% 92% 95% 92%   I&O's:   Intake/Output Summary (Last 24 hours) at 03/05/14 0843 Last data filed at 03/05/14 0700  Gross per 24 hour  Intake 1348.1 ml  Output   4625 ml  Net -3276.9 ml   TELEMETRY: Reviewed telemetry pt in NSR:     PHYSICAL EXAM General: Well developed, well nourished, in no acute distress Head: Eyes PERRLA, No xanthomas.   Normal cephalic and atramatic  Lungs:   Scattered rhonchi in upper airways Heart:   HRRR S1 S2 Pulses are 2+ & equal. Abdomen: Bowel sounds are positive, abdomen soft and non-tender without masses Extremities:   No clubbing, cyanosis or edema.  DP +1 Neuro: Alert and oriented X 3. Psych:  Good affect, responds appropriately   LABS: Basic Metabolic Panel:  Recent Labs  09/81/1907/06/09 1800 03/05/14 0340  NA 139 140  K 3.4* 2.9*  CL 100 99  CO2 19 21  GLUCOSE 96 55*  BUN 52* 56*  CREATININE 2.55* 2.79*  CALCIUM 7.5* 7.7*   Liver Function Tests: No results found for this basename: AST, ALT, ALKPHOS, BILITOT, PROT, ALBUMIN,  in the last 72 hours No results found for this basename: LIPASE, AMYLASE,  in the last 72 hours CBC:  Recent Labs  03/04/14 0630 03/05/14 0340  WBC 16.1* 12.1*  HGB 8.6* 7.8*  HCT 25.0* 22.6*  MCV 92.3 89.3  PLT 197 199   Cardiac Enzymes: No results found for this basename: CKTOTAL, CKMB, CKMBINDEX, TROPONINI,  in the last 72 hours BNP: No components found with this basename: POCBNP,  D-Dimer: No results found for this basename: DDIMER,  in the last 72 hours Hemoglobin A1C: No results found for this basename: HGBA1C,  in the last 72 hours Fasting Lipid Panel: No  results found for this basename: CHOL, HDL, LDLCALC, TRIG, CHOLHDL, LDLDIRECT,  in the last 72 hours Thyroid Function Tests: No results found for this basename: TSH, T4TOTAL, FREET3, T3FREE, THYROIDAB,  in the last 72 hours Anemia Panel: No results found for this basename: VITAMINB12, FOLATE, FERRITIN, TIBC, IRON, RETICCTPCT,  in the last 72 hours Coag Panel:   Lab Results  Component Value Date   INR 1.11 03/24/2014   INR 1.27 03/09/2014   INR 0.96 10/04/2013    RADIOLOGY: Ct Head Wo Contrast  03/02/2014   CLINICAL DATA:  COPD. Cardiac arrest at home on 02/27/2014. Maximum down time 10-18 min.  EXAM: CT HEAD WITHOUT CONTRAST  TECHNIQUE: Contiguous axial images were obtained from the base of the skull through the vertex without intravenous contrast.  COMPARISON:  03/11/2014  FINDINGS: There is mild central and cortical atrophy. Periventricular white matter changes are consistent with small vessel disease and appear stable. Chronic lacunar infarcts are again identified in the basal ganglia bilaterally. No calvarial fracture. There is atherosclerotic calcification of the internal carotid arteries.  IMPRESSION: 1. Atrophy and small vessel disease. 2. 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 3.  No evidence for acute intracranial  abnormality.   Electronically Signed   By: Rosalie Gums M.D.   On: 03/02/2014 17:23   Ct Head Wo Contrast  03/08/2014   CLINICAL DATA:  Rule out bleed, post resuscitation.  EXAM: CT HEAD WITHOUT CONTRAST  TECHNIQUE: Contiguous axial images were obtained from the base of the skull through the vertex without intravenous contrast.  COMPARISON:  CT of the head report September 12, 1998 though images are not available for direct comparison.  FINDINGS: The ventricles and sulci are normal for age. No intraparenchymal hemorrhage, mass effect nor midline shift. Confluent supratentorial white matter hypodensities are within normal range for patient's age and  though non-specific suggest sequelae of chronic small vessel ischemic disease. No acute large vascular territory infarcts. Bilateral basal ganglia and thalamus lacunar infarcts.  No abnormal extra-axial fluid collections. Basal cisterns are patent. Severe calcific atherosclerosis of the carotid siphons and included vertebral arteries.  No skull fracture. The included ocular globes and orbital contents are non-suspicious. Sphenoid sinus air-fluid levels, paranasal sinus mucosal thickening. Mastoid air cells are well aerated.  IMPRESSION: No evidence of intracranial hemorrhage.  Involutional changes. Severe white matter changes suggest chronic small vessel ischemic disease with bilateral thalamus and basal ganglia lacunar infarcts.   Electronically Signed   By: Awilda Metro   On: 03/14/2014 03:10   US Renal Port  03/04/2014   CLINICAL DATA:  Elevated renal function tests  EXAM: RENAL/URINARY TRACT ULTRASOUND COMPLETE  COMPARISON:  None.  FINDINGS: Right Kidney:  Length: 10.7 cm. Echogenicity within normal limits. No mass or hydronephrosis visualized.  Left Kidney:  Length: 9.3 cm. Echogenicity within normal limits. No mass or hydronephrosis visualized.  Bladder:  Decompressed by Foley catheter.  IMPRESSION: Unremarkable renal ultrasound.  No hydronephrosis is noted.   Electronically Signed   By: Alcide Clever M.D.   On: 03/04/2014 16:29   Dg Chest Port 1 View  03/03/2014   CLINICAL DATA:  Followup of pneumonia.  EXAM: PORTABLE CHEST - 1 VIEW  COMPARISON:  03/02/2014  FINDINGS: Endotracheal tube 2.7 cm above carina. Nasogastric tube extends beyond the inferior aspect of the film. Right internal jugular line tip at low SVC.  Cardiomegaly accentuated by AP portable technique. Atherosclerosis in the transverse aorta. Mild to moderate left hemidiaphragm elevation. Small left pleural effusion. No pneumothorax. No congestive failure. Patchy left base airspace disease.  IMPRESSION: No significant change since the  prior exam.  Small left pleural effusion with adjacent atelectasis or infection.   Electronically Signed   By: Jeronimo Greaves M.D.   On: 03/03/2014 07:39   Dg Chest Port 1 View  03/02/2014   CLINICAL DATA:  Respiratory failure  EXAM: PORTABLE CHEST - 1 VIEW  COMPARISON:  03/23/2014 and prior chest radiographs  FINDINGS: Cardiomegaly again identified.  An endotracheal tube with tip 2.4 cm above the carina, right IJ central venous catheter with tip overlying mid SVC, and NG tube entering the stomach with tip off the field of view identified.  Near complete resolution of pulmonary edema identified.  Left lower lung consolidations/atelectasis and small left pleural effusion noted.  There is no evidence of pneumothorax.  IMPRESSION: Near complete resolution of pulmonary edema.  Left lower lung consolidations/atelectasis and small left pleural effusion.  Support apparatus as described.   Electronically Signed   By: Laveda Abbe M.D.   On: 03/02/2014 07:40   Dg Chest Port 1 View  02/23/2014   CLINICAL DATA:  Status post central line placement.  EXAM: PORTABLE CHEST - 1  VIEW  COMPARISON:  Chest x-ray from the same day at 04:37 a.m.  FINDINGS: Stable heart size and upper mediastinal contours. Unchanged diffuse pulmonary edema. No increasing pleural fluid. No appreciable pneumothorax.  New right IJ catheter, tip at the SVC. Unchanged and unremarkable positioning of endotracheal and orogastric tubes. Endotracheal tube ends between the clavicular heads and carina.  IMPRESSION: 1. New right IJ catheter, tip at the SVC.  No pneumothorax. 2. Unchanged pulmonary edema.   Electronically Signed   By: Tiburcio Pea M.D.   On: 03/20/2014 06:33   Dg Chest Port 1 View  03/22/2014   CLINICAL DATA:  Rule out pneumothorax.  History of hypertension.  EXAM: PORTABLE CHEST - 1 VIEW  COMPARISON:  Chest x-ray from the same day at 12:52 a.m.  FINDINGS: Unchanged borderline cardiomegaly. Stable aortic contours. The endotracheal tube ends between  the clavicular heads and carina. The orogastric tube reaches the stomach. There is unchanged pulmonary edema. No evidence of effusion or pneumothorax.  IMPRESSION: 1. No evidence of pneumothorax. 2. Endotracheal and orogastric tubes remain in good position. 3. Unchanged pulmonary edema.   Electronically Signed   By: Tiburcio Pea M.D.   On: 03/24/2014 05:01   Dg Chest Port 1 View  02/24/2014   CLINICAL DATA:  Assess endotracheal tube.  Status post CPR.  EXAM: PORTABLE CHEST - 1 VIEW  COMPARISON:  Chest radiograph October 03, 2013  FINDINGS: Endotracheal tube tip projects 3.5 cm above the carina. Nasogastric tube side port projects in proximal stomach, distal tip not imaged. Cardiac silhouette appears mildly enlarged. Tortuous calcified aorta. Mediastinal silhouette is nonsuspicious. Increased lung volumes may reflect underlying COPD, unchanged. Diffuse interstitial prominence with patchy central alveolar airspace opacities. No pleural effusions. No pneumothorax.  Vascular calcifications projecting in the upper extremities. Osteopenia.  IMPRESSION: Endotracheal tube tip projects 3.5 cm above the carina, nasogastric tube past the proximal stomach.  Mild cardiomegaly, interstitial and alveolar airspace opacities suggesting pulmonary edema, less likely ARDS.   Electronically Signed   By: Awilda Metro   On: 03/14/2014 01:06   Dg Abd Portable 1v  03/03/2014   CLINICAL DATA:  Check OG tube position  EXAM: PORTABLE ABDOMEN - 1 VIEW  COMPARISON:  None.  FINDINGS: OG tube tip is in the body or antrum of the stomach. Side port is in the body of the stomach.  Nonobstructive bowel gas pattern. No organomegaly or supine evidence of free air.  IMPRESSION: OG tube tip in the mid to distal stomach.   Electronically Signed   By: Charlett Nose M.D.   On: 03/03/2014 15:53    ASSESSMENT AND PLAN:  73 yo with history of COPD on home oxygen had PEA arrest yesterday, now intubated and s/p cooling  1. Cardiac arrest: PEA.  Suspect primary respiratory arrest. Now rewarmed and off sedation. Head CT yesterday with nothing acute. She is awake and eyes are moving. EEG showed diffuse cerebral dysfunction that is nonspecific c/w hypoxic/ischemic injury  2. Pulmonary: Severe COPD, suspect respiratory infection/COPD exacerbation is driving this. She is on vanco/Zosyn. PCT elevated at 7.2. PCCM managing  3. Hypotension: resolved 4. Tachycardia - now in NSR on IV Cardizem gtt  5. Elevated troponin: Mild TnI elevation to 1.4, steady. Suspect this is most likely demand ischemia related to PEA arrest/hypotension. Has baseline LBBB. Echo showed EF 35-40% with wall motion abnormalities, this is worse than prior (EF 50%). Could be stunning related to arrest.  - Continue ASA, statin, heparin gtt.  - At  this time would not pursue cath due to worsening renal function 6. Hyponatremia/Hypokalemia - replete potassium  7. A/C renal failure, stage IV - creatinine worsening most likely due to recent arrest as well as possibly intravascular volume depletion from diuretics.  Her lungs have rhonchi but no crackles with inspiration.   8.  Anemia - getting 1 unit PRBCs this am  Continue IV amio for AFT/atach suppression - change to PO if she passes swallowing study Continue IV Cardizem for tachycardia and BP control (BP elevated yesterday). Will avoid BB secondary to severe COPD.  Continue ASA/statin/Heparin gtt  Replete potassium  Check Chest xray and BNP this am to determine if she is volume overloaded.  With worsening renal function would hold on any further Lasix unless BNP is significantly elevated and chest xray shows evidence of volume overload   Melanie Reichert, MD  03/05/2014  8:43 AM

## 2014-03-05 NOTE — Progress Notes (Signed)
Inpatient Diabetes Program Recommendations  AACE/ADA: New Consensus Statement on Inpatient Glycemic Control (2013)  Target Ranges:  Prepandial:   less than 140 mg/dL      Peak postprandial:   less than 180 mg/dL (1-2 hours)      Critically ill patients:  140 - 180 mg/dL   Results for KOBY, PIERPOINT (MRN [id number]) as of 03/05/2014 12:05  Ref. Range 03/04/2014 07:37 03/04/2014 11:08 03/04/2014 15:34 03/04/2014 20:06 03/05/2014 00:09 03/05/2014 05:19 03/05/2014 05:42  Glucose-Capillary Latest Range: 70-99 mg/dL 644 (H) 034 (H) 742 (H) 86 72 63 (L) 154 (H)   Diabetes history: No Outpatient Diabetes medications: NA Current orders for Inpatient glycemic control: Lantus 20 units Q24H, Novolog 0-15 units Q4H  Inpatient Diabetes Program Recommendations Insulin - Basal: Noted hypoglycemia this morning with lab glucose of 55 and finger stick of 63 mg/dl at 5:95 am. Please consider discontinuing Lantus at this time. Correction (SSI): Please consider decreasing Novolog correction to sensitive scale Q4H.  Note: Noted patient was extubated on 8/11 and tube feeding was stopped. Patient has been started on a Dys 2 diet. Since patient does not have a prior history of diabetes, anticipate she will not require basal insulin now that she is off tube feeds.    Thanks, Orlando Penner, RN, MSN, CCRN Diabetes Coordinator Inpatient Diabetes Program [phone] (Team Pager) [phone] (AP office) [phone] [facility] office)

## 2014-03-05 NOTE — Progress Notes (Signed)
Hypoglycemic Event  CBG: 63  Treatment: D50 IV 25 mL  Symptoms: None  Follow-up CBG: Time:0545 CBG Result: 154  Possible Reasons for Event: Inadequate meal intake    Melanie Williamson  Remember to initiate Hypoglycemia Order Set & complete

## 2014-03-05 NOTE — Progress Notes (Signed)
ANTICOAGULATION CONSULT NOTE - Follow Up Consult  Pharmacy Consult for Heparin  Indication: chest pain/ACS   No Known Allergies  Patient Measurements: Height: 5\' 3"  (160 cm) Weight: 151 lb 7.3 oz (68.7 kg) IBW/kg (Calculated) : 52.4 Heparin Dosing Weight: 66.5kg  Vital Signs: Temp: 98.5 F (36.9 C) (08/12 0900) Temp src: Oral (08/12 0900) Pulse Rate: 69 (08/12 1000)  Labs:  Recent Labs  03/03/14 0450 03/04/14 0500 03/04/14 0630 03/04/14 1200 03/04/14 1800 03/05/14 0340  HGB 8.6*  --  8.6*  --   --  7.8*  HCT 25.0*  --  25.0*  --   --  22.6*  PLT 234  --  197  --   --  199  HEPARINUNFRC 0.45 0.51  --   --   --  0.52  CREATININE 2.00*  --  2.34* 2.57* 2.55* 2.79*    Estimated Creatinine Clearance: 16.7 ml/min (by C-G formula based on Cr of 2.79).   Medications:  Heparin @ 1300 units/hr  Assessment: 73yof continues on heparin for NSTEMI. Heparin level is therapeutic. Hgb continues to drop to 7.8 and she will receive 1 unit of blood, platelets stable, no bleeding reported. Noted plan for cath if neurologic status and renal function improve.  Goal of Therapy:  Heparin level 0.3-0.7 units/ml Monitor platelets by anticoagulation protocol: Yes   Plan:  1) Continue heparin at 1300 units/hr 2) Follow up heparin level, CBC in AM  Fredrik Rigger 03/05/2014,11:26 AM

## 2014-03-05 NOTE — Progress Notes (Signed)
CRITICAL VALUE ALERT  Critical value received: Potassium 2.9  Date of notification:  03/05/14  Time of notification:  0510  Critical value read back:Yes.    Nurse who received alert:  Toula Moos  MD notified (1st page):  Pola Corn MD  Time of first page:  0515  Time MD responded:  [phone]

## 2014-03-05 NOTE — Progress Notes (Signed)
NUTRITION FOLLOW UP  Intervention:   Magic cup TID with meals, each supplement provides 290 kcal and 9 grams of protein  Nutrition Dx:   Inadequate oral intake related to inability to eat as evidenced by NPO; progressing  Goal:   Pt to meet >/= 90% of their estimated nutrition needs, not met.   Monitor:   Weight trends, PO intake, supplement acceptance, diet progression  Assessment:   73 yo with COPD (on nocturnal oxygen) admitted 8/6 after suffering a witnessed cardiac arrest at home.   - Hypothermia protocol initiated 8/7. Pt extubated 8/11. Started on Dysphagia diet after SLP eval 8/12.   Potassium low BUN/Cr elevated CBG's 92-154  Height: Ht Readings from Last 1 Encounters:  03/15/2014 _0  (1.6 m)    Weight Status:   Wt Readings from Last 1 Encounters:  03/05/14 151 lb 7.3 oz (68.7 kg)  Admission weight 150 lb (68 kg) 8/7  Re-estimated needs:  Kcal: 1400-1600 Protein: 70-80 grams Fluid: > 1.5 L/day  Skin: Skin tears on both arms, wound on left lower leg  Diet Order: Dysphagia 2 with Nectar Thickened Liquids   Intake/Output Summary (Last 24 hours) at 03/05/14 1451 Last data filed at 03/05/14 1400  Gross per 24 hour  Intake 1432.8 ml  Output   5290 ml  Net -3857.2 ml    Last BM: 8/12   Labs:   Recent Labs Lab 03/20/2014 1011  03/02/14 0456  03/04/14 1200 03/04/14 1800 03/05/14 0340  NA 137  < > 134*  < > 135* 139 140  K 3.4*  < > 5.3  < > 3.5* 3.4* 2.9*  CL 105  < > 103  < > 99 100 99  CO2 15*  < > 16*  < > _1 BUN 27*  < > 33*  < > 53* 52* 56*  CREATININE 1.18*  < > 1.79*  < > 2.57* 2.55* 2.79*  CALCIUM 7.3*  < > 7.0*  < > 7.0* 7.5* 7.7*  MG 1.8  --  1.4*  --   --   --   --   GLUCOSE 317*  < > 92  < > 131* 96 55*  < > = values in this interval not displayed.  CBG (last 3)   Recent Labs  03/05/14 0542 03/05/14 0811 03/05/14 1207  GLUCAP 154* 92 113*    Scheduled Meds: . amiodarone  400 mg Oral BID  . antiseptic oral rinse  7  mL Mouth Rinse QID  . arformoterol  15 mcg Nebulization Q12H  . aspirin  81 mg Per Tube Daily  . atorvastatin  40 mg Oral q1800  . budesonide (PULMICORT) nebulizer solution  0.5 mg Nebulization BID  . chlorhexidine  15 mL Mouth Rinse BID  . diltiazem  30 mg Oral QID  . insulin aspart  0-15 Units Subcutaneous 6 times per day  . insulin glargine  20 Units Subcutaneous Q24H  . ipratropium  0.5 mg Nebulization Q12H  . pantoprazole  40 mg Oral Daily  . piperacillin-tazobactam (ZOSYN)  IV  2.25 g Intravenous 4 times per day    Continuous Infusions: . sodium chloride Stopped (03/02/14 2200)  . feeding supplement (VITAL AF 1.2 CAL) 1,000 mL (03/04/14 1200)  . heparin 1,300 Units/hr (03/04/14 2133)    [place], [place], [place] Pager [phone] After Hours Pager

## 2014-03-06 LAB — CBC WITH DIFFERENTIAL/PLATELET
Basophils Absolute: 0 10*3/uL (ref 0.0–0.1)
Basophils Relative: 0 % (ref 0–1)
EOS ABS: 0.1 10*3/uL (ref 0.0–0.7)
Eosinophils Relative: 1 % (ref 0–5)
HCT: 32.1 % — ABNORMAL LOW (ref 36.0–46.0)
HEMOGLOBIN: 11 g/dL — AB (ref 12.0–15.0)
LYMPHS PCT: 14 % (ref 12–46)
Lymphs Abs: 2 10*3/uL (ref 0.7–4.0)
MCH: 30.9 pg (ref 26.0–34.0)
MCHC: 34.3 g/dL (ref 30.0–36.0)
MCV: 90.2 fL (ref 78.0–100.0)
MONOS PCT: 7 % (ref 3–12)
Monocytes Absolute: 1 10*3/uL (ref 0.1–1.0)
NEUTROS PCT: 78 % — AB (ref 43–77)
Neutro Abs: 11 10*3/uL — ABNORMAL HIGH (ref 1.7–7.7)
Platelets: 212 10*3/uL (ref 150–400)
RBC: 3.56 MIL/uL — AB (ref 3.87–5.11)
RDW: 14.1 % (ref 11.5–15.5)
WBC: 14.1 10*3/uL — AB (ref 4.0–10.5)

## 2014-03-06 LAB — BASIC METABOLIC PANEL
ANION GAP: 19 — AB (ref 5–15)
BUN: 55 mg/dL — ABNORMAL HIGH (ref 6–23)
CHLORIDE: 102 meq/L (ref 96–112)
CO2: 21 meq/L (ref 19–32)
CREATININE: 2.72 mg/dL — AB (ref 0.50–1.10)
Calcium: 8.7 mg/dL (ref 8.4–10.5)
GFR calc Af Amer: 19 mL/min — ABNORMAL LOW (ref >90)
GFR calc non Af Amer: 16 mL/min — ABNORMAL LOW (ref >90)
Glucose, Bld: 86 mg/dL (ref 70–99)
POTASSIUM: 3.6 meq/L — AB (ref 3.7–5.3)
SODIUM: 142 meq/L (ref 137–147)

## 2014-03-06 LAB — GLUCOSE, CAPILLARY
Glucose-Capillary: 91 mg/dL (ref 70–99)
Glucose-Capillary: 77 mg/dL (ref 70–99)
Glucose-Capillary: 79 mg/dL (ref 70–99)

## 2014-03-06 LAB — TYPE AND SCREEN
ABO/RH(D): A POS
Antibody Screen: NEGATIVE
Unit division: 0

## 2014-03-06 LAB — CULTURE, BLOOD (ROUTINE X 2)
CULTURE: NO GROWTH
CULTURE: NO GROWTH

## 2014-03-06 LAB — HEPARIN LEVEL (UNFRACTIONATED): HEPARIN UNFRACTIONATED: 0.66 [IU]/mL (ref 0.30–0.70)

## 2014-03-06 LAB — CLOSTRIDIUM DIFFICILE BY PCR: Toxigenic C. Difficile by PCR: NEGATIVE

## 2014-03-06 MED ORDER — HYDRALAZINE HCL 50 MG PO TABS
50.0000 mg | ORAL_TABLET | Freq: Three times a day (TID) | ORAL | Status: DC
  Filled 2014-03-06 (×3): qty 1

## 2014-03-06 MED ORDER — ISOSORB DINITRATE-HYDRALAZINE 20-37.5 MG PO TABS
1.0000 | ORAL_TABLET | Freq: Two times a day (BID) | ORAL | Status: DC
  Filled 2014-03-06 (×2): qty 1

## 2014-03-06 MED ORDER — NITROGLYCERIN IN D5W 200-5 MCG/ML-% IV SOLN
2.0000 ug/min | INTRAVENOUS | Status: DC
  Administered 2014-03-06 – 2014-03-07 (×2): 5 ug/min via INTRAVENOUS
  Filled 2014-03-06 (×2): qty 250

## 2014-03-06 MED ORDER — HEPARIN SODIUM (PORCINE) 5000 UNIT/ML IJ SOLN
5000.0000 [IU] | Freq: Three times a day (TID) | INTRAMUSCULAR | Status: DC
  Administered 2014-03-06 – 2014-03-15 (×29): 5000 [IU] via SUBCUTANEOUS
  Filled 2014-03-06 (×37): qty 1

## 2014-03-06 MED ORDER — POTASSIUM CHLORIDE CRYS ER 20 MEQ PO TBCR
40.0000 meq | EXTENDED_RELEASE_TABLET | Freq: Once | ORAL | Status: AC
  Administered 2014-03-06: 40 meq via ORAL
  Filled 2014-03-06: qty 2

## 2014-03-06 MED ORDER — HYDRALAZINE HCL 20 MG/ML IJ SOLN
10.0000 mg | INTRAMUSCULAR | Status: DC | PRN
  Administered 2014-03-07 (×2): 20 mg via INTRAVENOUS
  Filled 2014-03-06 (×2): qty 1

## 2014-03-06 MED ORDER — HYDRALAZINE HCL 10 MG PO TABS
10.0000 mg | ORAL_TABLET | Freq: Three times a day (TID) | ORAL | Status: DC
  Administered 2014-03-06 (×3): 10 mg via ORAL
  Filled 2014-03-06 (×6): qty 1

## 2014-03-06 MED ORDER — ISOSORBIDE DINITRATE 20 MG PO TABS
20.0000 mg | ORAL_TABLET | Freq: Two times a day (BID) | ORAL | Status: DC
Start: 2014-03-06 — End: 2014-03-06
  Filled 2014-03-06 (×2): qty 1

## 2014-03-06 MED ORDER — FUROSEMIDE 10 MG/ML IJ SOLN
INTRAMUSCULAR | Status: AC
  Filled 2014-03-06: qty 8

## 2014-03-06 MED ORDER — FUROSEMIDE 10 MG/ML IJ SOLN
60.0000 mg | Freq: Once | INTRAMUSCULAR | Status: AC
  Administered 2014-03-06: 60 mg via INTRAVENOUS

## 2014-03-06 NOTE — Evaluation (Signed)
Physical Therapy Evaluation Patient Details Name: Melanie Williamson MRN: [id number] DOB: 1940-08-01 Today's Date: 03/06/2014   History of Present Illness  Melanie Williamson is an 73 y.o. female with a past medical history significant for HTN, hyperlipidemia, COPD on nocturnal oxygen, admitted after suffering a witnessed cardiac arrest at home. She was intubated in field and ROSC was achieved with 10 mins of CPR and epi x 3. Maximum total downtime 10-18 minutes based on EMS.  Clinical Impression  Pt admitted with the above. Pt currently with functional limitations due to the deficits listed below (see PT Problem List). At the time of PT eval pt was able to perform some repositioning maneuvers and scoot to/from edge of chair. Sats decreased to 80% during this time. Prior to admission pt states she was independent at home. Pt will benefit from skilled PT to increase their independence and safety with mobility to allow discharge to the venue listed below. Feel pt may be a good candidate for CIR depending on progress with acute PT. Per RN, pt is very motivated and has good family support.      Follow Up Recommendations CIR    Equipment Recommendations  Rolling walker with 5" wheels    Recommendations for Other Services Rehab consult     Precautions / Restrictions Precautions Precautions: Fall Restrictions Weight Bearing Restrictions: No      Mobility  Bed Mobility               General bed mobility comments: Pt sitting in recliner upon PT arrival. Per nursing, pt required total assist to transfer. Pt was slouched in chair and was able to pull herself up to full sitting x5. Each time pt was able to hold ~20 seconds and then required a rest break. During this time, sats decreased as low as 80%.   Transfers                 General transfer comment: Deferred due to lowering O2 sats during scooting activity.   Ambulation/Gait                Stairs            Wheelchair  Mobility    Modified Rankin (Stroke Patients Only)       Balance Overall balance assessment: Needs assistance Sitting-balance support: Feet supported;Bilateral upper extremity supported Sitting balance-Leahy Scale: Poor Sitting balance - Comments: Required assist to maintain sitting balance once seated on edge of chair.                                      Pertinent Vitals/Pain Pain Assessment: No/denies pain    Home Living Family/patient expects to be discharged to:: Private residence Living Arrangements: Children Available Help at Discharge: Family;Available 24 hours/day Type of Home: House Home Access: Level entry     Home Layout: One level Home Equipment: Cane - single point Comments: Pt recently moved to a second floor apartment with 10 steps to enter. Granddaughter states that it would be very difficult for her to return there due to steps and sliding gait to enter stairwell. She also states that pt could come live with her and her family (one story home with ramped entrance available).       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       Communication   Communication: Expressive difficulties  Cognition Arousal/Alertness: Awake/alert Behavior During Therapy: WFL for tasks assessed/performed Overall Cognitive Status: No family/caregiver present to determine baseline cognitive functioning                      General Comments      Exercises        Assessment/Plan    PT Assessment Patient needs  continued PT services  PT Diagnosis Difficulty walking;Generalized weakness   PT Problem List Decreased strength;Decreased range of motion;Decreased activity tolerance;Decreased balance;Decreased mobility;Decreased knowledge of use of DME;Decreased safety awareness;Decreased knowledge of precautions;Cardiopulmonary status limiting activity  PT Treatment Interventions DME instruction;Gait training;Stair training;Functional mobility training;Therapeutic activities;Therapeutic exercise;Neuromuscular re-education;Patient/family education   PT Goals (Current goals can be found in the Care Plan section) Acute Rehab PT Goals Patient Stated Goal: To return home with her family. PT Goal Formulation: With patient Time For Goal Achievement: 03/20/14 Potential to Achieve Goals: Fair    Frequency Min 3X/week   Barriers to discharge        Co-evaluation               End of Session Equipment Utilized During Treatment: Oxygen Activity Tolerance: Patient limited by fatigue Patient left: in chair;with call bell/phone within reach Nurse Communication: Mobility status;Need for lift equipment         Time: [id number] PT Time Calculation (min): 25 min   Charges:   PT Evaluation $Initial PT Evaluation Tier I: 1 Procedure PT Treatments $Therapeutic Activity: 8-22 mins   PT G Codes:          Melanie CancerHamilton, Melanie Williamson 03/06/2014, 3:29 PM  Melanie CancerLaura Williamson, PT, DPT Acute Rehabilitation Services Pager: [phone]

## 2014-03-06 NOTE — Progress Notes (Signed)
PULMONARY / CRITICAL CARE MEDICINE  Name: Melanie Williamson MRN: [id number] DOB: Aug 03, 1940    ADMISSION DATE:  02/27/2014  REFERRING MD :  EDP  CHIEF COMPLAINT:  Cardiac arrest  INITIAL PRESENTATION: 73 yo with COPD (on nocturnal oxygen) admitted 8/6 after suffering a witnessed cardiac arrest at home. She was intubated in field and ROSC was achieved with 10 mins of CPR and epi x 3. Maximum total downtime 10-18 minutes based on EMS.  STUDIES:  8/6  CT head >>> nad 8/7  Echo >> EF 35 to 40%, mod MR 8/7  Doppler legs b/l >> no DVT 8/10 CT head > atrophy and small vessel disease, chronic lacunar infarcts stable, no intracranial abnormality 8/10 EEG>>diffuse cerebral dysfunction that is non-specific in etiology and can be seen with hypoxic/ischemic injury, toxic/metabolic encephalopathies, or medication effect. Sharp waves seen over the posterior quadrant regions indicate possible epileptogenic potential from the occipital and right posterior temporal regions. There were no electrographic seizures seen in this study 8/11 renal ultrasound > no hydro  SIGNIFICANT EVENTS: 8/6  Admitted after cardiac arrest, hypothermia protocol started 8/8  Rewarming started at 6:30 am, off pressors 8/11 extubated, briefly required BIPAP for pulm edema  INTERVAL HISTORY:  Passed speech evaluation, ate yesterday, speech improving, wants to get out of bed  VITAL SIGNS: Temp:  [97.7 F (36.5 C)-99.1 F (37.3 C)] 98.3 F (36.8 C) (08/13 0300) Pulse Rate:  [64-89] 82 (08/13 0600) Resp:  [14-26] 21 (08/13 0600) BP: (136-159)/(44-65) 159/65 mmHg (08/13 0510) SpO2:  [89 %-97 %] 94 % (08/13 0600) Arterial Line BP: (128-174)/(37-66) 167/58 mmHg (08/13 0600) Weight:  [68 kg (149 lb 14.6 oz)] 68 kg (149 lb 14.6 oz) (08/13 0300)  HEMODYNAMICS: CVP:  [0 mmHg-8 mmHg] 0 mmHg  VENTILATOR SETTINGS:    INTAKE / OUTPUT: Intake/Output     08/12 0701 - 08/13 0700 08/13 0701 - 08/14 0700   P.O. 60    I.V. (mL/kg) 801  (11.8)    Blood 385    NG/GT     IV Piggyback 300    Total Intake(mL/kg) 1546 (22.7)    Urine (mL/kg/hr) 2200 (1.3)    Total Output 2200     Net -654          Stool Occurrence 1 x      PHYSICAL EXAMINATION:  Gen: awake, alert HEENT: NCAT, OP clear PULM: few crackles RUL and LLL CV: Reg rhythm with few irreg beats, no mgr AB: BS+, soft Ext: warm, no edema Neuro: awake, oriented to place, situation, speech improved  LABS:  CBC  Recent Labs Lab 03/04/14 0630 03/05/14 0340 03/06/14 0450  WBC 16.1* 12.1* 14.1*  HGB 8.6* 7.8* 11.0*  HCT 25.0* 22.6* 32.1*  PLT 197 199 212    Coag's  Recent Labs Lab 02/26/2014 0050 03/01/2014 1011  APTT 33  --   INR 1.27 1.11   BMET  Recent Labs Lab 03/05/14 0340 03/05/14 1600 03/06/14 0450  NA 140 139 142  K 2.9* 3.5* 3.6*  CL 99 99 102  CO2 21 20 21   BUN 56* 56* 55*  CREATININE 2.79* 2.70* 2.72*  GLUCOSE 55* 157* 86   Electrolytes  Recent Labs Lab 03/11/2014 1011  03/02/14 0456  03/05/14 0340 03/05/14 1600 03/06/14 0450  CALCIUM 7.3*  < > 7.0*  < > 7.7* 8.3* 8.7  MG 1.8  --  1.4*  --   --   --   --   < > = values in  this interval not displayed.  Sepsis Markers  Recent Labs Lab 03/22/2014 0059 03/07/2014 1011  02/27/2014 2200 03/01/14 0400 03/03/14 1300  LATICACIDVEN 8.15* 3.5*  < > 3.3* 2.8* 0.9  PROCALCITON  --  4.62  --   --  7.17  --   < > = values in this interval not displayed. ABG  Recent Labs Lab 03/01/14 0921 03/02/14 0414 03/03/14 1434  PHART 7.402 7.308* 7.316*  PCO2ART 25.3* 32.6* 19.5*  PO2ART 108.0* 127.0* 108.0*   Cardiac Enzymes  Recent Labs Lab 03/22/2014 2000 03/11/2014 2200 03/01/14 0400 03/05/14 1058  TROPONINI 1.42* 1.41* 1.40*  --   PROBNP  --   --   --  38198.0*   Glucose  Recent Labs Lab 03/05/14 0811 03/05/14 1207 03/05/14 1605 03/05/14 1949 03/05/14 2324 03/06/14 0320  GLUCAP 92 113* 169* 91 91 77   IMAGING:  [place] Renal Port  03/04/2014   CLINICAL DATA:   Elevated renal function tests  EXAM: RENAL/URINARY TRACT ULTRASOUND COMPLETE  COMPARISON:  None.  FINDINGS: Right Kidney:  Length: 10.7 cm. Echogenicity within normal limits. No mass or hydronephrosis visualized.  Left Kidney:  Length: 9.3 cm. Echogenicity within normal limits. No mass or hydronephrosis visualized.  Bladder:  Decompressed by Foley catheter.  IMPRESSION: Unremarkable renal ultrasound.  No hydronephrosis is noted.   Electronically Signed   By: Alcide CleverMark  Lukens M.D.   On: 03/04/2014 16:29   Dg Chest Port 1 View  03/05/2014   CLINICAL DATA:  Congestive heart failure.  Cough.  EXAM: PORTABLE CHEST - 1 VIEW  COMPARISON:  03/03/2014  FINDINGS: Endotracheal tube and nasogastric tube a been removed. Right internal jugular central line has its tip in the SVC 2 cm above the right atrium. There is worsened airspace density in volume loss an both mid and lower lungs, more extensive on the left than the right. This could be due to atelectasis or pneumonia. Pleural fluid is present on the left.  IMPRESSION: Worsening airspace density in the mid and lower lungs bilaterally, left more than right. This could be atelectasis in or pneumonia. Endotracheal tube and nasogastric tube removed.   Electronically Signed   By: Paulina FusiMark  Shogry M.D.   On: 03/05/2014 10:13   ASSESSMENT / PLAN:  PULMONARY ETT 8/6 >>  A: Acute respiratory failure 2nd to AECOPD, aspiration PNA Overall improving ?Flash pulm edema on 8/11 around time of extubation P:   Incentive spirometry Flutter OOB Lasix x1 today Continue pulmicort Continue atrovent, brovana and prn xopenex O2 as needed for O2 saturation > 92%  CARDIOVASCULAR Rt IJ CVL 8/7 >> Rt femoral Aline 8/7 >> A:  PEA arrest with NSTEMI with acute systolic heart failure, likely secondary to respiratory compromise. Shock (septic vs cardiogenic ) >> resolved Sinus tachycardia/intermittent Afib Hypertension P:  Continue ASA, Lipitor Rate control per cardiology ( IV  Cardizem, Amiodarone) LHC when renal function improves Lasix x1 today Holding lisinopril due to renal function Start bidil 1 pill bid Hydralazine prn hypertension  RENAL A:   AKI> ATN? Leveling off Metabolic acidosis > stable Hypokalemia P:   Monitor renal fx, urine outpt electrolytes Replace K, repeat BMET  GASTROINTESTINAL A:   Nutrition.  P:   Dysphagia 2 with nectar thick Protonix  HEMATOLOGIC A:   VTE Px. Therapeutic heparinization for ACS. Anemia of critical illness. P:  Monitor CBC Transfuse for Hgb < 8 given ACS Heparin per pharmacy  INFECTIOUS A:   HCAP vs aspiration pneumonia Blood cx 8/7 >>  Resp  urine culture 8/7 >> P:   Day 6  Zosyn, would plan 10 day course   ENDOCRINE A:   Hyperglycemia> resolved P:   D/c cbg  NEUROLOGIC A:   Acute encephalopathy with possible anoxia> improving daily Aphasia? Also improving R arm weakness  P:   Consult neurology MRI?  Dispo: OK to transfer to SDU, [facility] service, PCCM off  Heber Brady, MD [place] [facility] Pager: [phone] Cell: [phone] If no response, call [phone]

## 2014-03-06 NOTE — Progress Notes (Signed)
Patient's BP correlated to arterial line when taken in RIGHT arm.  Melanie Williamson, [name]

## 2014-03-06 NOTE — Consult Note (Signed)
Referring Physician: Dr. Lake Bells    Chief Complaint: dysphasia and ? right arm weakness.  HPI:                                                                                                                                         Melanie Williamson is an 73 y.o. female with a past medical history significant for HTN, hyperlipidemia, COPD on nocturnal oxygen, admitted after suffering a witnessed cardiac arrest at home. She was intubated in field and ROSC was achieved with 10 mins of CPR and epi x 3. Maximum total downtime 10-18 minutes based on EMS. She underwent hypothermia protocol. Family is at the bedside and stated that since this past Monday they have been noticing that she is slurring her words and favoring the left hand when eating or drinking from a cup. Patient nurse and family agreed that the speech is getting better day by day. She denies HA, vertigo, double vision, difficulty swallowing, visual disturbances, or focal numbness. CT brain  8/9 showed no acute abnormality. EEG 8/10 EEG>>diffuse cerebral dysfunction that is non-specific in etiology and can be seen with hypoxic/ischemic injury, toxic/metabolic encephalopathies, or medication effect. Sharp waves seen over the posterior quadrant regions indicate possible epileptogenic potential from the occipital and right posterior temporal regions. There were no electrographic seizures seen. Serologies significant for Cr 2.72, mild hypokalemia, and WBC>14,000. Date last known well: uncertain Time last known well: uncertain tPA Given: no, late presentation   Past Medical History  Diagnosis Date  . Hypertension   . Hyperlipidemia 07/17/2011    History reviewed. No pertinent past surgical history.  No family history on file. Social History:  reports that she has quit smoking. She has never used smokeless tobacco. She reports that she does not drink alcohol or use illicit drugs.  Allergies: No Known Allergies  Medications:                                                                                                                            Scheduled: . amiodarone  400 mg Oral BID  . antiseptic oral rinse  7 mL Mouth Rinse QID  . arformoterol  15 mcg Nebulization Q12H  . aspirin  81 mg Per Tube Daily  . atorvastatin  40 mg Oral q1800  . budesonide (PULMICORT) nebulizer solution  0.5 mg Nebulization BID  .  chlorhexidine  15 mL Mouth Rinse BID  . diltiazem  30 mg Oral QID  . heparin subcutaneous  5,000 Units Subcutaneous 3 times per day  . hydrALAZINE  10 mg Oral TID  . insulin glargine  20 Units Subcutaneous Q24H  . ipratropium  0.5 mg Nebulization Q12H  . pantoprazole  40 mg Oral Daily  . piperacillin-tazobactam (ZOSYN)  IV  2.25 g Intravenous 4 times per day    ROS:                                                                                                                                       History obtained from the patient, family, and chart review.  General ROS: negative for - chills, fatigue, fever, night sweats, weight gain or weight loss Psychological ROS: negative for - behavioral disorder, hallucinations, mood swings or suicidal ideation. Some short term memory impairment since admission. Ophthalmic ROS: negative for - blurry vision, double vision, eye pain or loss of vision ENT ROS: negative for - epistaxis, nasal discharge, oral lesions, sore throat, tinnitus or vertigo Allergy and Immunology ROS: negative for - hives or itchy/watery eyes Hematological and Lymphatic ROS: negative for - bleeding problems, bruising or swollen lymph nodes Endocrine ROS: negative for - galactorrhea, hair pattern changes, polydipsia/polyuria or temperature intolerance Respiratory ROS: negative for - cough, hemoptysis, shortness of breath or wheezing Cardiovascular ROS: negative for - chest pain, dyspnea on exertion, edema or irregular heartbeat Gastrointestinal ROS: negative for - abdominal pain, diarrhea, hematemesis,  nausea/vomiting or stool incontinence Genito-Urinary ROS: negative for - dysuria, hematuria, incontinence or urinary frequency/urgency Musculoskeletal ROS: negative for - joint swelling Neurological ROS: as noted in HPI Dermatological ROS: negative for rash and skin lesion changes  Physical exam: pleasant female in no apparent distress. Blood pressure 192/73, pulse 92, temperature 97.9 F (36.6 C), temperature source Oral, resp. rate 25, height 5' 3"  (1.6 m), weight 68 kg (149 lb 14.6 oz), SpO2 92.00%. Head: normocephalic. Neck: supple, no bruits, no JVD. Cardiac: no murmurs. Lungs: clear. Abdomen: soft, no tender, no mass. Extremities: no edema. Neurologic Examination:                                                                                                      General: Mental Status: Alert, oriented x 4, thought content appropriate. Comprehension and repetition intact but some trouble naming. Mild dysarthria?   Able to follow 3 step commands without difficulty. Cranial Nerves: II: Discs flat bilaterally; Visual fields  grossly normal, pupils equal, round, reactive to light and accommodation III,IV, VI: ptosis not present, extra-ocular motions intact bilaterally V,VII: smile symmetric, facial light touch sensation normal bilaterally VIII: hearing normal bilaterally IX,X: gag reflex present XI: bilateral shoulder shrug XII: midline tongue extension without atrophy or fasciculations Motor: Right greater than left restriction in arms movements. Bilateral LE strength seems to be appropriate.   Tone and bulk:normal tone throughout; no atrophy noted Sensory: Pinprick and light touch intact throughout, bilaterally Deep Tendon Reflexes:  Right: Upper Extremity   Left: Upper extremity   biceps (C-5 to C-6) 2/4   biceps (C-5 to C-6) 2/4 tricep (C7) 2/4    triceps (C7) 2/4 Brachioradialis (C6) 2/4  Brachioradialis (C6) 2/4  Lower Extremity Lower Extremity  quadriceps (L-2 to L-4)  2/4   quadriceps (L-2 to L-4) 2/4 Achilles (S1) 2/4   Achilles (S1) 2/4  Plantars: Right: upgoing   Left: upgoing Cerebellar: No tested Gait:  Unable to test CV: pulses palpable throughout    Results for orders placed during the hospital encounter of 03/02/2014 (from the past 48 hour(s))  GLUCOSE, CAPILLARY     Status: Abnormal   Collection Time    03/04/14 11:08 AM      Result Value Ref Range   Glucose-Capillary 163 (*) 70 - 99 mg/dL   Comment 1 Documented in Chart    KETONES, QUALITATIVE     Status: None   Collection Time    03/04/14 12:00 PM      Result Value Ref Range   Acetone, Bld NEGATIVE  NEGATIVE  BASIC METABOLIC PANEL     Status: Abnormal   Collection Time    03/04/14 12:00 PM      Result Value Ref Range   Sodium 135 (*) 137 - 147 mEq/L   Potassium 3.5 (*) 3.7 - 5.3 mEq/L   Chloride 99  96 - 112 mEq/L   CO2 20  19 - 32 mEq/L   Glucose, Bld 131 (*) 70 - 99 mg/dL   BUN 53 (*) 6 - 23 mg/dL   Creatinine, Ser 2.57 (*) 0.50 - 1.10 mg/dL   Calcium 7.0 (*) 8.4 - 10.5 mg/dL   GFR calc non Af Amer 17 (*) >90 mL/min   GFR calc Af Amer 20 (*) >90 mL/min   Comment: (NOTE)     The eGFR has been calculated using the CKD EPI equation.     This calculation has not been validated in all clinical situations.     eGFR's persistently <90 mL/min signify possible Chronic Kidney     Disease.   Anion gap 16 (*) 5 - 15  SODIUM, URINE, RANDOM     Status: None   Collection Time    03/04/14 12:59 PM      Result Value Ref Range   Sodium, Ur 96    CREATININE, URINE, RANDOM     Status: None   Collection Time    03/04/14 12:59 PM      Result Value Ref Range   Creatinine, Urine 16.00    GLUCOSE, CAPILLARY     Status: Abnormal   Collection Time    03/04/14  3:34 PM      Result Value Ref Range   Glucose-Capillary 103 (*) 70 - 99 mg/dL  BASIC METABOLIC PANEL     Status: Abnormal   Collection Time    03/04/14  6:00 PM      Result Value Ref Range   Sodium 139  137 - 147  mEq/L    Potassium 3.4 (*) 3.7 - 5.3 mEq/L   Chloride 100  96 - 112 mEq/L   CO2 19  19 - 32 mEq/L   Glucose, Bld 96  70 - 99 mg/dL   BUN 52 (*) 6 - 23 mg/dL   Creatinine, Ser 2.55 (*) 0.50 - 1.10 mg/dL   Calcium 7.5 (*) 8.4 - 10.5 mg/dL   GFR calc non Af Amer 18 (*) >90 mL/min   GFR calc Af Amer 20 (*) >90 mL/min   Comment: (NOTE)     The eGFR has been calculated using the CKD EPI equation.     This calculation has not been validated in all clinical situations.     eGFR's persistently <90 mL/min signify possible Chronic Kidney     Disease.   Anion gap 20 (*) 5 - 15  GLUCOSE, CAPILLARY     Status: None   Collection Time    03/04/14  8:06 PM      Result Value Ref Range   Glucose-Capillary 86  70 - 99 mg/dL  GLUCOSE, CAPILLARY     Status: None   Collection Time    03/05/14 12:09 AM      Result Value Ref Range   Glucose-Capillary 72  70 - 99 mg/dL  HEPARIN LEVEL (UNFRACTIONATED)     Status: None   Collection Time    03/05/14  3:40 AM      Result Value Ref Range   Heparin Unfractionated 0.52  0.30 - 0.70 IU/mL   Comment:            IF HEPARIN RESULTS ARE BELOW     EXPECTED VALUES, AND PATIENT     DOSAGE HAS BEEN CONFIRMED,     SUGGEST FOLLOW UP TESTING     OF ANTITHROMBIN III LEVELS.  BASIC METABOLIC PANEL     Status: Abnormal   Collection Time    03/05/14  3:40 AM      Result Value Ref Range   Sodium 140  137 - 147 mEq/L   Potassium 2.9 (*) 3.7 - 5.3 mEq/L   Comment: CRITICAL RESULT CALLED TO, READ BACK BY AND VERIFIED WITHHaywood Lasso RN [id number] [phone] GREEN R   Chloride 99  96 - 112 mEq/L   CO2 21  19 - 32 mEq/L   Glucose, Bld 55 (*) 70 - 99 mg/dL   BUN 56 (*) 6 - 23 mg/dL   Creatinine, Ser 2.79 (*) 0.50 - 1.10 mg/dL   Calcium 7.7 (*) 8.4 - 10.5 mg/dL   GFR calc non Af Amer 16 (*) >90 mL/min   GFR calc Af Amer 18 (*) >90 mL/min   Comment: (NOTE)     The eGFR has been calculated using the CKD EPI equation.     This calculation has not been validated in all clinical  situations.     eGFR's persistently <90 mL/min signify possible Chronic Kidney     Disease.   Anion gap 20 (*) 5 - 15  CBC     Status: Abnormal   Collection Time    03/05/14  3:40 AM      Result Value Ref Range   WBC 12.1 (*) 4.0 - 10.5 K/uL   RBC 2.53 (*) 3.87 - 5.11 MIL/uL   Hemoglobin 7.8 (*) 12.0 - 15.0 g/dL   HCT 22.6 (*) 36.0 - 46.0 %   MCV 89.3  78.0 - 100.0 fL   MCH 30.8  26.0 -  34.0 pg   MCHC 34.5  30.0 - 36.0 g/dL   RDW 13.4  11.5 - 15.5 %   Platelets 199  150 - 400 K/uL  GLUCOSE, CAPILLARY     Status: Abnormal   Collection Time    03/05/14  5:19 AM      Result Value Ref Range   Glucose-Capillary 63 (*) 70 - 99 mg/dL  GLUCOSE, CAPILLARY     Status: Abnormal   Collection Time    03/05/14  5:42 AM      Result Value Ref Range   Glucose-Capillary 154 (*) 70 - 99 mg/dL  GLUCOSE, CAPILLARY     Status: None   Collection Time    03/05/14  8:11 AM      Result Value Ref Range   Glucose-Capillary 92  70 - 99 mg/dL  PREPARE RBC (CROSSMATCH)     Status: None   Collection Time    03/05/14  8:55 AM      Result Value Ref Range   Order Confirmation ORDER PROCESSED BY BLOOD BANK    TYPE AND SCREEN     Status: None   Collection Time    03/05/14  8:55 AM      Result Value Ref Range   ABO/RH(D) A POS     Antibody Screen NEG     Sample Expiration 03/08/2014     Unit Number [id number]     Blood Component Type RED CELLS,LR     Unit division 00     Status of Unit ISSUED,FINAL     Transfusion Status OK TO TRANSFUSE     Crossmatch Result Compatible    ABO/RH     Status: None   Collection Time    03/05/14  8:55 AM      Result Value Ref Range   ABO/RH(D) A POS    PRO B NATRIURETIC PEPTIDE     Status: Abnormal   Collection Time    03/05/14 10:58 AM      Result Value Ref Range   Pro B Natriuretic peptide (BNP) 38198.0 (*) 0 - 125 pg/mL  GLUCOSE, CAPILLARY     Status: Abnormal   Collection Time    03/05/14 12:07 PM      Result Value Ref Range   Glucose-Capillary 113 (*)  70 - 99 mg/dL  BASIC METABOLIC PANEL     Status: Abnormal   Collection Time    03/05/14  4:00 PM      Result Value Ref Range   Sodium 139  137 - 147 mEq/L   Potassium 3.5 (*) 3.7 - 5.3 mEq/L   Chloride 99  96 - 112 mEq/L   CO2 20  19 - 32 mEq/L   Glucose, Bld 157 (*) 70 - 99 mg/dL   BUN 56 (*) 6 - 23 mg/dL   Creatinine, Ser 2.70 (*) 0.50 - 1.10 mg/dL   Calcium 8.3 (*) 8.4 - 10.5 mg/dL   GFR calc non Af Amer 16 (*) >90 mL/min   GFR calc Af Amer 19 (*) >90 mL/min   Comment: (NOTE)     The eGFR has been calculated using the CKD EPI equation.     This calculation has not been validated in all clinical situations.     eGFR's persistently <90 mL/min signify possible Chronic Kidney     Disease.   Anion gap 20 (*) 5 - 15  GLUCOSE, CAPILLARY     Status: Abnormal   Collection Time    03/05/14  4:05  PM      Result Value Ref Range   Glucose-Capillary 169 (*) 70 - 99 mg/dL  GLUCOSE, CAPILLARY     Status: None   Collection Time    03/05/14  7:49 PM      Result Value Ref Range   Glucose-Capillary 91  70 - 99 mg/dL  GLUCOSE, CAPILLARY     Status: None   Collection Time    03/05/14 11:24 PM      Result Value Ref Range   Glucose-Capillary 91  70 - 99 mg/dL  GLUCOSE, CAPILLARY     Status: None   Collection Time    03/06/14  3:20 AM      Result Value Ref Range   Glucose-Capillary 77  70 - 99 mg/dL  HEPARIN LEVEL (UNFRACTIONATED)     Status: None   Collection Time    03/06/14  4:50 AM      Result Value Ref Range   Heparin Unfractionated 0.66  0.30 - 0.70 IU/mL   Comment:            IF HEPARIN RESULTS ARE BELOW     EXPECTED VALUES, AND PATIENT     DOSAGE HAS BEEN CONFIRMED,     SUGGEST FOLLOW UP TESTING     OF ANTITHROMBIN III LEVELS.  BASIC METABOLIC PANEL     Status: Abnormal   Collection Time    03/06/14  4:50 AM      Result Value Ref Range   Sodium 142  137 - 147 mEq/L   Potassium 3.6 (*) 3.7 - 5.3 mEq/L   Chloride 102  96 - 112 mEq/L   CO2 21  19 - 32 mEq/L   Glucose, Bld  86  70 - 99 mg/dL   BUN 55 (*) 6 - 23 mg/dL   Creatinine, Ser 2.72 (*) 0.50 - 1.10 mg/dL   Calcium 8.7  8.4 - 10.5 mg/dL   GFR calc non Af Amer 16 (*) >90 mL/min   GFR calc Af Amer 19 (*) >90 mL/min   Comment: (NOTE)     The eGFR has been calculated using the CKD EPI equation.     This calculation has not been validated in all clinical situations.     eGFR's persistently <90 mL/min signify possible Chronic Kidney     Disease.   Anion gap 19 (*) 5 - 15  CBC WITH DIFFERENTIAL     Status: Abnormal   Collection Time    03/06/14  4:50 AM      Result Value Ref Range   WBC 14.1 (*) 4.0 - 10.5 K/uL   RBC 3.56 (*) 3.87 - 5.11 MIL/uL   Hemoglobin 11.0 (*) 12.0 - 15.0 g/dL   Comment: REPEATED TO VERIFY   HCT 32.1 (*) 36.0 - 46.0 %   MCV 90.2  78.0 - 100.0 fL   MCH 30.9  26.0 - 34.0 pg   MCHC 34.3  30.0 - 36.0 g/dL   RDW 14.1  11.5 - 15.5 %   Platelets 212  150 - 400 K/uL   Neutrophils Relative % 78 (*) 43 - 77 %   Lymphocytes Relative 14  12 - 46 %   Monocytes Relative 7  3 - 12 %   Eosinophils Relative 1  0 - 5 %   Basophils Relative 0  0 - 1 %   Neutro Abs 11.0 (*) 1.7 - 7.7 K/uL   Lymphs Abs 2.0  0.7 - 4.0 K/uL   Monocytes Absolute 1.0  0.1 - 1.0 K/uL   Eosinophils Absolute 0.1  0.0 - 0.7 K/uL   Basophils Absolute 0.0  0.0 - 0.1 K/uL   RBC Morphology POLYCHROMASIA PRESENT     WBC Morphology MILD LEFT SHIFT (1-5% METAS, OCC MYELO, OCC BANDS)     Smear Review LARGE PLATELETS PRESENT    GLUCOSE, CAPILLARY     Status: None   Collection Time    03/06/14  7:59 AM      Result Value Ref Range   Glucose-Capillary 79  70 - 99 mg/dL  CLOSTRIDIUM DIFFICILE BY PCR     Status: None   Collection Time    03/06/14  8:59 AM      Result Value Ref Range   C difficile by pcr NEGATIVE  NEGATIVE   US Renal Port  03/04/2014   CLINICAL DATA:  Elevated renal function tests  EXAM: RENAL/URINARY TRACT ULTRASOUND COMPLETE  COMPARISON:  None.  FINDINGS: Right Kidney:  Length: 10.7 cm. Echogenicity  within normal limits. No mass or hydronephrosis visualized.  Left Kidney:  Length: 9.3 cm. Echogenicity within normal limits. No mass or hydronephrosis visualized.  Bladder:  Decompressed by Foley catheter.  IMPRESSION: Unremarkable renal ultrasound.  No hydronephrosis is noted.   Electronically Signed   By: Inez Catalina M.D.   On: 03/04/2014 16:29   Dg Chest Port 1 View  03/05/2014   CLINICAL DATA:  Congestive heart failure.  Cough.  EXAM: PORTABLE CHEST - 1 VIEW  COMPARISON:  03/03/2014  FINDINGS: Endotracheal tube and nasogastric tube a been removed. Right internal jugular central line has its tip in the SVC 2 cm above the right atrium. There is worsened airspace density in volume loss an both mid and lower lungs, more extensive on the left than the right. This could be due to atelectasis or pneumonia. Pleural fluid is present on the left.  IMPRESSION: Worsening airspace density in the mid and lower lungs bilaterally, left more than right. This could be atelectasis in or pneumonia. Endotracheal tube and nasogastric tube removed.   Electronically Signed   By: Nelson Chimes M.D.   On: 03/05/2014 10:13    Assessment: 73 y.o. female s/p cardiac arrest and HP, noted to have right arm weakness and language impairment for the past couple of days. Symptoms improving but still with mild dysphasia/dysarthria and right arm weakness. Will go ahead and obtain MRI brain to exclude subacute left cortical infarct. Will follow up after the test.    Dorian Pod, MD [facility] [phone]  03/06/2014, 10:31 AM

## 2014-03-06 NOTE — Progress Notes (Signed)
Rehab Admissions Coordinator Note:  Patient was screened by Clois Dupes for appropriateness for an Inpatient Acute Rehab Consult per PT recommendation.   At this time, we are recommending await medical workup completion to determine rehab venue. I will follow.Clois Dupes 03/06/2014, 3:58 PM  I can be reached at [phone].

## 2014-03-06 NOTE — Progress Notes (Signed)
Speech Language Pathology Treatment: Dysphagia  Patient Details Name: Melanie Williamson MRN: [id number] DOB: 1941/01/11 Today's Date: 03/06/2014 Time: 2595-6387 SLP Time Calculation (min): 15 min  Assessment / Plan / Recommendation Clinical Impression  Pt. Consuming breakfast when SLP arrived with daughter at bedside.  SLP reviewed MBS results with pt./daughter with clinical rationale of recommendations/strategies/swallowing prognisis.  No difficulty observed taking pills whole with RN.  Delayed cough x 2 during session suspect related more to pulmonary status versus po's.  Min verbal reminders for small bites with review of precautions.  ST will follow with goal to return to non restricted solids/liquids.   HPI HPI: 73 year old female with PMH: HTN, hyperlipidenia admitted with respiratory disress and pt. found to be in PEA with CPR initiated.  CXR Left lower lung consolidations/atelectasis and small left pleural effusion. Found to have severe COPD.  INtubated 8/7-8/11.  ST ordered for ability to initiate po's.   Pertinent Vitals Pain Assessment: No/denies pain  SLP Plan  Continue with current plan of care    Recommendations Diet recommendations: Dysphagia 2 (fine chop);Nectar-thick liquid Liquids provided via: Cup;No straw Medication Administration: Whole meds with puree Supervision: Patient able to self feed;Staff to assist with self feeding;Full supervision/cueing for compensatory strategies Compensations: Slow rate;Small sips/bites Postural Changes and/or Swallow Maneuvers: Seated upright 90 degrees              Oral Care Recommendations: Oral care BID Follow up Recommendations: Inpatient Rehab Plan: Continue with current plan of care    GO     [name] M.Ed [employer] [phone]  03/06/2014

## 2014-03-06 NOTE — Progress Notes (Signed)
SUBJECTIVE:  No complaints  OBJECTIVE:   Vitals:   Filed Vitals:   03/06/14 0510 03/06/14 0600 03/06/14 0752 03/06/14 0800  BP: 159/65   181/66  Pulse: 79 82  83  Temp:   97.9 F (36.6 C)   TempSrc:   Oral   Resp: 18 21  20   Height:      Weight:      SpO2: 94% 94%  92%   I&O's:   Intake/Output Summary (Last 24 hours) at 03/06/14 0841 Last data filed at 03/06/14 0700  Gross per 24 hour  Intake 1474.3 ml  Output   2200 ml  Net -725.7 ml   TELEMETRY: Reviewed telemetry pt in NSR:     PHYSICAL EXAM General: Well developed, well nourished, in no acute distress Head: Eyes PERRLA, No xanthomas.   Normal cephalic and atramatic  Lungs:   Clear bilaterally to auscultation and percussion. HEART:  RRR with no M/R/G Abdomen: Bowel sounds are positive, abdomen soft and non-tender without masses  Extremities:   No clubbing, cyanosis or edema.  DP +1 Neuro: Alert and oriented X 3. Psych:  Good affect, responds appropriately   LABS: Basic Metabolic Panel:  Recent Labs  16/10/96 1600 03/06/14 0450  NA 139 142  K 3.5* 3.6*  CL 99 102  CO2 20 21  GLUCOSE 157* 86  BUN 56* 55*  CREATININE 2.70* 2.72*  CALCIUM 8.3* 8.7   Liver Function Tests: No results found for this basename: AST, ALT, ALKPHOS, BILITOT, PROT, ALBUMIN,  in the last 72 hours No results found for this basename: LIPASE, AMYLASE,  in the last 72 hours CBC:  Recent Labs  03/05/14 0340 03/06/14 0450  WBC 12.1* 14.1*  NEUTROABS  --  11.0*  HGB 7.8* 11.0*  HCT 22.6* 32.1*  MCV 89.3 90.2  PLT 199 212   Cardiac Enzymes: No results found for this basename: CKTOTAL, CKMB, CKMBINDEX, TROPONINI,  in the last 72 hours BNP: No components found with this basename: POCBNP,  D-Dimer: No results found for this basename: DDIMER,  in the last 72 hours Hemoglobin A1C: No results found for this basename: HGBA1C,  in the last 72 hours Fasting Lipid Panel: No results found for this basename: CHOL, HDL, LDLCALC,  TRIG, CHOLHDL, LDLDIRECT,  in the last 72 hours Thyroid Function Tests: No results found for this basename: TSH, T4TOTAL, FREET3, T3FREE, THYROIDAB,  in the last 72 hours Anemia Panel: No results found for this basename: VITAMINB12, FOLATE, FERRITIN, TIBC, IRON, RETICCTPCT,  in the last 72 hours Coag Panel:   Lab Results  Component Value Date   INR 1.11 March 23, 2014   INR 1.27 2014-03-23   INR 0.96 10/04/2013    RADIOLOGY: Ct Head Wo Contrast  03/02/2014   CLINICAL DATA:  COPD. Cardiac arrest at home on 02/27/2014. Maximum down time 10-18 min.  EXAM: CT HEAD WITHOUT CONTRAST  TECHNIQUE: Contiguous axial images were obtained from the base of the skull through the vertex without intravenous contrast.  COMPARISON:  03-23-14  FINDINGS: There is mild central and cortical atrophy. Periventricular white matter changes are consistent with small vessel disease and appear stable. Chronic lacunar infarcts are again identified in the basal ganglia bilaterally. No calvarial fracture. There is atherosclerotic calcification of the internal carotid arteries.  IMPRESSION: 1. Atrophy and small vessel disease. 2. 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 3.  No evidence for acute intracranial abnormality.   Electronically Signed   By: Sandria Senter.D.  On: 03/02/2014 17:23   Ct Head Wo Contrast  03/18/14   CLINICAL DATA:  Rule out bleed, post resuscitation.  EXAM: CT HEAD WITHOUT CONTRAST  TECHNIQUE: Contiguous axial images were obtained from the base of the skull through the vertex without intravenous contrast.  COMPARISON:  CT of the head report September 12, 1998 though images are not available for direct comparison.  FINDINGS: The ventricles and sulci are normal for age. No intraparenchymal hemorrhage, mass effect nor midline shift. Confluent supratentorial white matter hypodensities are within normal range for patient's age and though non-specific suggest sequelae of chronic  small vessel ischemic disease. No acute large vascular territory infarcts. Bilateral basal ganglia and thalamus lacunar infarcts.  No abnormal extra-axial fluid collections. Basal cisterns are patent. Severe calcific atherosclerosis of the carotid siphons and included vertebral arteries.  No skull fracture. The included ocular globes and orbital contents are non-suspicious. Sphenoid sinus air-fluid levels, paranasal sinus mucosal thickening. Mastoid air cells are well aerated.  IMPRESSION: No evidence of intracranial hemorrhage.  Involutional changes. Severe white matter changes suggest chronic small vessel ischemic disease with bilateral thalamus and basal ganglia lacunar infarcts.   Electronically Signed   By: Awilda Metro   On: 2014/03/18 03:10   US Renal Port  03/04/2014   CLINICAL DATA:  Elevated renal function tests  EXAM: RENAL/URINARY TRACT ULTRASOUND COMPLETE  COMPARISON:  None.  FINDINGS: Right Kidney:  Length: 10.7 cm. Echogenicity within normal limits. No mass or hydronephrosis visualized.  Left Kidney:  Length: 9.3 cm. Echogenicity within normal limits. No mass or hydronephrosis visualized.  Bladder:  Decompressed by Foley catheter.  IMPRESSION: Unremarkable renal ultrasound.  No hydronephrosis is noted.   Electronically Signed   By: Alcide Clever M.D.   On: 03/04/2014 16:29   Dg Chest Port 1 View  03/05/2014   CLINICAL DATA:  Congestive heart failure.  Cough.  EXAM: PORTABLE CHEST - 1 VIEW  COMPARISON:  03/03/2014  FINDINGS: Endotracheal tube and nasogastric tube a been removed. Right internal jugular central line has its tip in the SVC 2 cm above the right atrium. There is worsened airspace density in volume loss an both mid and lower lungs, more extensive on the left than the right. This could be due to atelectasis or pneumonia. Pleural fluid is present on the left.  IMPRESSION: Worsening airspace density in the mid and lower lungs bilaterally, left more than right. This could be  atelectasis in or pneumonia. Endotracheal tube and nasogastric tube removed.   Electronically Signed   By: Paulina Fusi M.D.   On: 03/05/2014 10:13   Dg Chest Port 1 View  03/03/2014   CLINICAL DATA:  Followup of pneumonia.  EXAM: PORTABLE CHEST - 1 VIEW  COMPARISON:  03/02/2014  FINDINGS: Endotracheal tube 2.7 cm above carina. Nasogastric tube extends beyond the inferior aspect of the film. Right internal jugular line tip at low SVC.  Cardiomegaly accentuated by AP portable technique. Atherosclerosis in the transverse aorta. Mild to moderate left hemidiaphragm elevation. Small left pleural effusion. No pneumothorax. No congestive failure. Patchy left base airspace disease.  IMPRESSION: No significant change since the prior exam.  Small left pleural effusion with adjacent atelectasis or infection.   Electronically Signed   By: Jeronimo Greaves M.D.   On: 03/03/2014 07:39   Dg Chest Port 1 View  03/02/2014   CLINICAL DATA:  Respiratory failure  EXAM: PORTABLE CHEST - 1 VIEW  COMPARISON:  March 18, 2014 and prior chest radiographs  FINDINGS: Cardiomegaly again identified.  An endotracheal tube with tip 2.4 cm above the carina, right IJ central venous catheter with tip overlying mid SVC, and NG tube entering the stomach with tip off the field of view identified.  Near complete resolution of pulmonary edema identified.  Left lower lung consolidations/atelectasis and small left pleural effusion noted.  There is no evidence of pneumothorax.  IMPRESSION: Near complete resolution of pulmonary edema.  Left lower lung consolidations/atelectasis and small left pleural effusion.  Support apparatus as described.   Electronically Signed   By: Laveda Abbe M.D.   On: 03/02/2014 07:40   Dg Chest Port 1 View  03/22/2014   CLINICAL DATA:  Status post central line placement.  EXAM: PORTABLE CHEST - 1 VIEW  COMPARISON:  Chest x-ray from the same day at 04:37 a.m.  FINDINGS: Stable heart size and upper mediastinal contours. Unchanged diffuse  pulmonary edema. No increasing pleural fluid. No appreciable pneumothorax.  New right IJ catheter, tip at the SVC. Unchanged and unremarkable positioning of endotracheal and orogastric tubes. Endotracheal tube ends between the clavicular heads and carina.  IMPRESSION: 1. New right IJ catheter, tip at the SVC.  No pneumothorax. 2. Unchanged pulmonary edema.   Electronically Signed   By: Tiburcio Pea M.D.   On: 03/05/2014 06:33   Dg Chest Port 1 View  03/24/2014   CLINICAL DATA:  Rule out pneumothorax.  History of hypertension.  EXAM: PORTABLE CHEST - 1 VIEW  COMPARISON:  Chest x-ray from the same day at 12:52 a.m.  FINDINGS: Unchanged borderline cardiomegaly. Stable aortic contours. The endotracheal tube ends between the clavicular heads and carina. The orogastric tube reaches the stomach. There is unchanged pulmonary edema. No evidence of effusion or pneumothorax.  IMPRESSION: 1. No evidence of pneumothorax. 2. Endotracheal and orogastric tubes remain in good position. 3. Unchanged pulmonary edema.   Electronically Signed   By: Tiburcio Pea M.D.   On: 02/23/2014 05:01   Dg Chest Port 1 View  02/27/2014   CLINICAL DATA:  Assess endotracheal tube.  Status post CPR.  EXAM: PORTABLE CHEST - 1 VIEW  COMPARISON:  Chest radiograph October 03, 2013  FINDINGS: Endotracheal tube tip projects 3.5 cm above the carina. Nasogastric tube side port projects in proximal stomach, distal tip not imaged. Cardiac silhouette appears mildly enlarged. Tortuous calcified aorta. Mediastinal silhouette is nonsuspicious. Increased lung volumes may reflect underlying COPD, unchanged. Diffuse interstitial prominence with patchy central alveolar airspace opacities. No pleural effusions. No pneumothorax.  Vascular calcifications projecting in the upper extremities. Osteopenia.  IMPRESSION: Endotracheal tube tip projects 3.5 cm above the carina, nasogastric tube past the proximal stomach.  Mild cardiomegaly, interstitial and alveolar  airspace opacities suggesting pulmonary edema, less likely ARDS.   Electronically Signed   By: Awilda Metro   On: 03/10/2014 01:06   Dg Abd Portable 1v  03/03/2014   CLINICAL DATA:  Check OG tube position  EXAM: PORTABLE ABDOMEN - 1 VIEW  COMPARISON:  None.  FINDINGS: OG tube tip is in the body or antrum of the stomach. Side port is in the body of the stomach.  Nonobstructive bowel gas pattern. No organomegaly or supine evidence of free air.  IMPRESSION: OG tube tip in the mid to distal stomach.   Electronically Signed   By: Charlett Nose M.D.   On: 03/03/2014 15:53    ASSESSMENT AND PLAN:  73 yo with history of COPD on home oxygen had PEA arrest yesterday, now intubated and s/p cooling  1. Cardiac arrest: PEA.  Suspect primary respiratory arrest. Now rewarmed and off sedation. Head CT  with nothing acute. She is awake and eyes are moving. EEG showed diffuse cerebral dysfunction that is nonspecific c/w hypoxic/ischemic injury  2. Pulmonary: Severe COPD, suspect respiratory infection/COPD exacerbation is driving this. She is on vanco/Zosyn. PCT elevated at 7.2. PCCM managing.  Chest xray yesterday showed worsening airspace disease mid and lower lungs bilaterally, left more than right worrisome for PNA.  WBC is tending upward.  Continue Zosyn and consider broadening antibiotic coverage if WBC continues to climb 3. Hypotension: resolved and now markedly hypertensive with SBP 200mmHg - d/c Bidil and start IV NTG gtt and titrate to keep SBP < 150 and DBP <4290mmHg and start Hydralazine 10mg  TID - would avoid titrating cardiazem any further due to reduced LVF 4. Tachycardia - now in NSR on PO Cardizem and Amio.  No BB secondary to severe COPD 5. Elevated troponin: Mild TnI elevation to 1.4, steady. Suspect this is most likely demand ischemia related to PEA arrest/hypotension. Has baseline LBBB. Echo showed EF 35-40% with wall motion abnormalities, this is worse than prior (EF 50%). Could be stunning related  to arrest.  - Continue ASA, statin.  D/C Heparin gtt - no BB secondary to severe COPD - At this time would not pursue cath due to worsening renal function  6. Hyponatremia/Hypokalemia - repleted 7. A/C renal failure, stage IV - creatinine worsening most likely due to recent arrest  8. Anemia - received 1 unit PRBCs this am  9. Acute systolic CHF - BNP markedly elevated but CVP is only 7 and chest xray did not show overt CHF but did show left pleural effusion. Lasix IV given this am.    Quintella ReichertURNER,TRACI R, MD  03/06/2014  8:41 AM

## 2014-03-06 NOTE — Progress Notes (Signed)
[facility] ADULT ICU REPLACEMENT PROTOCOL FOR AM LAB REPLACEMENT ONLY  The patient does not apply for the [facility] Adult ICU Electrolyte Replacment Protocol based on the criteria listed below:    Is GFR >/= 40 ml/min? No.  Patient's GFR today is 16   Abnormal electrolyte(s):K3.6   If a panic level lab has been reported, has the CCM MD in charge been notified? Yes.  .   Physician:  Orlean Bradford, MD  Melrose Nakayama 03/06/2014 5:54 AM

## 2014-03-07 ENCOUNTER — Inpatient Hospital Stay (HOSPITAL_COMMUNITY): Payer: Medicare Other

## 2014-03-07 DIAGNOSIS — J9601 Acute respiratory failure with hypoxia: Secondary | ICD-10-CM | POA: Diagnosis present

## 2014-03-07 DIAGNOSIS — I509 Heart failure, unspecified: Secondary | ICD-10-CM

## 2014-03-07 DIAGNOSIS — N179 Acute kidney failure, unspecified: Secondary | ICD-10-CM | POA: Diagnosis present

## 2014-03-07 DIAGNOSIS — R0902 Hypoxemia: Secondary | ICD-10-CM

## 2014-03-07 DIAGNOSIS — I5021 Acute systolic (congestive) heart failure: Secondary | ICD-10-CM

## 2014-03-07 DIAGNOSIS — N183 Chronic kidney disease, stage 3 unspecified: Secondary | ICD-10-CM | POA: Diagnosis present

## 2014-03-07 DIAGNOSIS — J449 Chronic obstructive pulmonary disease, unspecified: Secondary | ICD-10-CM

## 2014-03-07 DIAGNOSIS — J9600 Acute respiratory failure, unspecified whether with hypoxia or hypercapnia: Secondary | ICD-10-CM

## 2014-03-07 DIAGNOSIS — R471 Dysarthria and anarthria: Secondary | ICD-10-CM | POA: Diagnosis present

## 2014-03-07 DIAGNOSIS — R4182 Altered mental status, unspecified: Secondary | ICD-10-CM | POA: Diagnosis present

## 2014-03-07 DIAGNOSIS — J189 Pneumonia, unspecified organism: Secondary | ICD-10-CM | POA: Diagnosis present

## 2014-03-07 DIAGNOSIS — I214 Non-ST elevation (NSTEMI) myocardial infarction: Secondary | ICD-10-CM | POA: Diagnosis present

## 2014-03-07 DIAGNOSIS — J9610 Chronic respiratory failure, unspecified whether with hypoxia or hypercapnia: Secondary | ICD-10-CM

## 2014-03-07 LAB — BASIC METABOLIC PANEL
Anion gap: 16 — ABNORMAL HIGH (ref 5–15)
BUN: 51 mg/dL — ABNORMAL HIGH (ref 6–23)
CO2: 23 meq/L (ref 19–32)
CREATININE: 2.45 mg/dL — AB (ref 0.50–1.10)
Calcium: 7.9 mg/dL — ABNORMAL LOW (ref 8.4–10.5)
Chloride: 106 mEq/L (ref 96–112)
GFR calc Af Amer: 21 mL/min — ABNORMAL LOW (ref >90)
GFR calc non Af Amer: 18 mL/min — ABNORMAL LOW (ref >90)
Glucose, Bld: 109 mg/dL — ABNORMAL HIGH (ref 70–99)
Potassium: 3.3 mEq/L — ABNORMAL LOW (ref 3.7–5.3)
Sodium: 145 mEq/L (ref 137–147)

## 2014-03-07 LAB — CBC WITH DIFFERENTIAL/PLATELET
BASOS PCT: 0 % (ref 0–1)
Basophils Absolute: 0 10*3/uL (ref 0.0–0.1)
Eosinophils Absolute: 0 10*3/uL (ref 0.0–0.7)
Eosinophils Relative: 0 % (ref 0–5)
HEMATOCRIT: 29.6 % — AB (ref 36.0–46.0)
HEMOGLOBIN: 10.2 g/dL — AB (ref 12.0–15.0)
LYMPHS PCT: 13 % (ref 12–46)
Lymphs Abs: 1.8 10*3/uL (ref 0.7–4.0)
MCH: 30.7 pg (ref 26.0–34.0)
MCHC: 34.5 g/dL (ref 30.0–36.0)
MCV: 89.2 fL (ref 78.0–100.0)
Monocytes Absolute: 1.3 10*3/uL — ABNORMAL HIGH (ref 0.1–1.0)
Monocytes Relative: 9 % (ref 3–12)
NEUTROS ABS: 10.9 10*3/uL — AB (ref 1.7–7.7)
Neutrophils Relative %: 78 % — ABNORMAL HIGH (ref 43–77)
Platelets: 242 10*3/uL (ref 150–400)
RBC: 3.32 MIL/uL — ABNORMAL LOW (ref 3.87–5.11)
RDW: 14.2 % (ref 11.5–15.5)
SMEAR REVIEW: ADEQUATE
WBC: 14 10*3/uL — ABNORMAL HIGH (ref 4.0–10.5)

## 2014-03-07 LAB — BLOOD GAS, ARTERIAL
Acid-base deficit: 0.8 mmol/L (ref 0.0–2.0)
Bicarbonate: 22.7 mEq/L (ref 20.0–24.0)
DRAWN BY: 36989
FIO2: 100 %
O2 Saturation: 94.1 %
PCO2 ART: 32.7 mmHg — AB (ref 35.0–45.0)
PH ART: 7.455 — AB (ref 7.350–7.450)
Patient temperature: 98.6
TCO2: 23.7 mmol/L (ref 0–100)
pO2, Arterial: 73.1 mmHg — ABNORMAL LOW (ref 80.0–100.0)

## 2014-03-07 MED ORDER — POTASSIUM CHLORIDE CRYS ER 20 MEQ PO TBCR
40.0000 meq | EXTENDED_RELEASE_TABLET | Freq: Once | ORAL | Status: AC
  Administered 2014-03-07: 40 meq via ORAL
  Filled 2014-03-07: qty 2

## 2014-03-07 MED ORDER — FUROSEMIDE 10 MG/ML IJ SOLN
160.0000 mg | Freq: Once | INTRAMUSCULAR | Status: AC
  Administered 2014-03-07: 160 mg via INTRAVENOUS
  Filled 2014-03-07: qty 16

## 2014-03-07 MED ORDER — HYDRALAZINE HCL 25 MG PO TABS
25.0000 mg | ORAL_TABLET | Freq: Three times a day (TID) | ORAL | Status: DC
  Administered 2014-03-07 (×3): 25 mg via ORAL
  Filled 2014-03-07 (×6): qty 1

## 2014-03-07 MED ORDER — VANCOMYCIN HCL IN DEXTROSE 1-5 GM/200ML-% IV SOLN
1000.0000 mg | Freq: Once | INTRAVENOUS | Status: AC
  Administered 2014-03-07: 1000 mg via INTRAVENOUS
  Filled 2014-03-07: qty 200

## 2014-03-07 MED ORDER — FUROSEMIDE 10 MG/ML IJ SOLN
60.0000 mg | Freq: Once | INTRAMUSCULAR | Status: AC
  Administered 2014-03-07: 60 mg via INTRAVENOUS

## 2014-03-07 MED ORDER — POTASSIUM CHLORIDE 10 MEQ/50ML IV SOLN
10.0000 meq | INTRAVENOUS | Status: AC
  Administered 2014-03-07 – 2014-03-08 (×4): 10 meq via INTRAVENOUS
  Filled 2014-03-07 (×4): qty 50

## 2014-03-07 MED ORDER — FUROSEMIDE 10 MG/ML IJ SOLN
INTRAMUSCULAR | Status: AC
  Filled 2014-03-07: qty 4

## 2014-03-07 MED ORDER — VANCOMYCIN HCL IN DEXTROSE 1-5 GM/200ML-% IV SOLN
1000.0000 mg | INTRAVENOUS | Status: DC
  Filled 2014-03-07: qty 200

## 2014-03-07 NOTE — Consult Note (Signed)
PULMONARY  / CRITICAL CARE MEDICINE CONSULTATION   Name: Melanie Williamson MRN: [id number] DOB: 03/27/1941    ADMISSION DATE:  02/27/2014 CONSULTATION DATE: 03/07/2014  REQUESTING CLINICIAN: Dr. Joseph ArtWoods PRIMARY SERVICE: TRH  CHIEF COMPLAINT:  SOB  BRIEF PATIENT DESCRIPTION: [age] F with COPD admitted 8/7 for PEA arrest presumed 2/2 respiratory failure s/p rewarming with worsening respiratory distress evening of 8/14. Unclear if 2/2 pulmonary edema or ARDS. PCCM asked to consult.  SIGNIFICANT EVENTS / STUDIES:  8/6 CT head >>> nad  8/7 Echo >> EF 35 to 40%, mod MR (new from 10/05/2013) 8/7 Doppler legs b/l >> no DVT  8/8 Rewarming started at 6:30 am, off pressors  8/10 CT head > atrophy and small vessel disease, chronic lacunar infarcts stable, no intracranial abnormality  8/10 EEG>>diffuse cerebral dysfunction that is non-specific in etiology and can be seen with hypoxic/ischemic injury, toxic/metabolic encephalopathies, or medication effect. Sharp waves seen over the posterior quadrant regions indicate possible epileptogenic potential from the occipital and right posterior temporal regions. There were no electrographic seizures seen in this study  8/11 renal ultrasound > no hydro 8/11 extubated, briefly required BIPAP for pulm edema  LINES / TUBES: RIJ 8/7  CULTURES: Repeat blood cultures 8/14 Sputum culture 8/14  ANTIBIOTICS: Zosyn 8/7 - Vanc 8/7 - Levaquin 8/7-8/9  HISTORY OF PRESENT ILLNESS:  Melanie Williamson is a 73 yo F with COPD on nocturnal O2, HTN who was [facility] following a PEA arrest in the setting of hypoxemia. She was rewarmed with some possible neurological defects and difficulty swallowing. On the evening of 8/14 she developed worsening hypoxemia. She is currently awake and alert but aphasic and unable to provide any history.   PAST MEDICAL HISTORY :  Past Medical History  Diagnosis Date  . Hypertension   . Hyperlipidemia 07/17/2011  . COPD (chronic obstructive  pulmonary disease)    History reviewed. No pertinent past surgical history. Prior to Admission medications   Medication Sig Start Date End Date Taking? Authorizing Provider  acetaminophen (TYLENOL) 650 MG CR tablet Take 650 mg by mouth every 8 (eight) hours as needed for pain.   Yes Historical Provider, MD  ALPRAZolam Prudy Feeler(XANAX) 0.5 MG tablet Take 0.5 mg by mouth daily as needed for anxiety.  09/24/13  Yes Historical Provider, MD  amLODipine (NORVASC) 5 MG tablet Take 1 tablet (5 mg total) by mouth daily. 10/05/13  Yes Drema Dallasurtis J Woods, MD  budesonide-formoterol [facility](SYMBICORT) 160-4.5 MCG/ACT inhaler Inhale 2 puffs into the lungs 2 (two) times daily.   Yes Historical Provider, MD  Caffeine (VIVARIN PO) Take 1 tablet by mouth as directed.   Yes Historical Provider, MD  diclofenac (CATAFLAM) 50 MG tablet Take 50 mg by mouth 2 (two) times daily.   Yes Historical Provider, MD  Homeopathic Products [facility](ZICAM COLD REMEDY PO) Take 1 tablet by mouth every 3 (three) hours.   Yes Historical Provider, MD  lisinopril (PRINIVIL,ZESTRIL) 20 MG tablet Take 20 mg by mouth daily.   Yes Historical Provider, MD  metoprolol succinate (TOPROL-XL) 50 MG 24 hr tablet Take 25 mg by mouth 2 (two) times daily. Take with or immediately following a meal.   Yes Historical Provider, MD  pravastatin (PRAVACHOL) 40 MG tablet Take 40 mg by mouth daily.     Yes Historical Provider, MD  PROAIR HFA 108 (90 BASE) MCG/ACT inhaler Inhale 2 puffs into the lungs daily as needed for wheezing or shortness of breath.  09/11/13  Yes Historical Provider, MD  traMADol (ULTRAM) 50 MG tablet Take 50-100 mg by mouth 3 (three) times daily as needed for moderate pain. Maximum dose= 8 tablets per day. For pain   Yes Historical Provider, MD   No Known Allergies  FAMILY HISTORY:  No family history on file. SOCIAL HISTORY:  reports that she has quit smoking. She has never used smokeless tobacco. She reports that she does not drink alcohol or use illicit  drugs.  REVIEW OF SYSTEMS:  Unable to obtain secondary to patient condition.   SUBJECTIVE:   VITAL SIGNS: Temp:  [98.6 F (37 C)-99.4 F (37.4 C)] 98.7 F (37.1 C) (08/14 2324) Pulse Rate:  [65-113] 88 (08/14 2300) Resp:  [16-29] 29 (08/14 2300) BP: (130-180)/(52-102) 168/102 mmHg (08/14 2300) SpO2:  [74 %-100 %] 91 % (08/14 2300) Weight:  [147 lb 14.9 oz (67.1 kg)] 147 lb 14.9 oz (67.1 kg) (08/14 0300) HEMODYNAMICS:   VENTILATOR SETTINGS:   INTAKE / OUTPUT: Intake/Output     08/14 0701 - 08/15 0700   P.O. 240   I.V. (mL/kg) 189.8 (2.8)   IV Piggyback 566   Total Intake(mL/kg) 995.8 (14.8)   Urine (mL/kg/hr) 1275 (0.8)   Total Output 1275   Net -279.3         PHYSICAL EXAMINATION: General:  Thin, frail looking F in mild resp distress Neuro:  Moves all extremities, answers yes/no questions but difficulty elaborating HEENT:  Sclera anicteric, conjunctiva pink, MMM, OP clear Neck: Trachea supple and midline, (-) LAN or JVD Cardiovascular:  RRR, NS1/S2, (-) MRG Lungs:  Coarse Rhonchi Bilaterally Abdomen:  S/NT/ND/(+)BS Musculoskeletal:  (-) C/C/E Skin:  Diffuse ecchymosis  LABS:  CBC  Recent Labs Lab 03/05/14 0340 03/06/14 0450 03/07/14 0250  WBC 12.1* 14.1* 14.0*  HGB 7.8* 11.0* 10.2*  HCT 22.6* 32.1* 29.6*  PLT 199 212 242   Coag's No results found for this basename: APTT, INR,  in the last 168 hours BMET  Recent Labs Lab 03/05/14 1600 03/06/14 0450 03/07/14 0250  NA 139 142 145  K 3.5* 3.6* 3.3*  CL 99 102 106  CO2 20 21 23   BUN 56* 55* 51*  CREATININE 2.70* 2.72* 2.45*  GLUCOSE 157* 86 109*   Electrolytes  Recent Labs Lab 03/02/14 0456  03/05/14 1600 03/06/14 0450 03/07/14 0250  CALCIUM 7.0*  < > 8.3* 8.7 7.9*  MG 1.4*  --   --   --   --   < > = values in this interval not displayed. Sepsis Markers  Recent Labs Lab 03/01/14 0400 03/03/14 1300  LATICACIDVEN 2.8* 0.9  PROCALCITON 7.17  --    ABG  Recent Labs Lab  03/02/14 0414 03/03/14 1434 03/07/14 2243  PHART 7.308* 7.316* 7.455*  PCO2ART 32.6* 19.5* 32.7*  PO2ART 127.0* 108.0* 73.1*   Liver Enzymes  Recent Labs Lab 03/02/14 0456  AST 36  ALT 24  ALKPHOS 73  BILITOT 0.3  ALBUMIN 2.1*   Cardiac Enzymes  Recent Labs Lab 03/01/14 0400 03/05/14 1058  TROPONINI 1.40*  --   PROBNP  --  38198.0*   Glucose  Recent Labs Lab 03/05/14 1207 03/05/14 1605 03/05/14 1949 03/05/14 2324 03/06/14 0320 03/06/14 0759  GLUCAP 113* 169* 91 91 77 79    Imaging Dg Chest Port 1 View  03/07/2014   CLINICAL DATA:  Acute hypoxia.  EXAM: PORTABLE CHEST - 1 VIEW  COMPARISON:  Chest radiograph performed 03/05/2014  FINDINGS: There is persistent left basilar airspace opacity, with mildly worsening bilateral mid lung zone  airspace opacities, concerning for worsening multifocal pneumonia. A small left pleural effusion is suspected. No pneumothorax is seen.  The cardiomediastinal silhouette is mildly enlarged. A right IJ line is seen ending about mid SVC. Calcification is noted along the thoracic aorta. No acute osseous abnormalities are seen.  IMPRESSION: 1. Persistent left basilar airspace opacity, with mildly worsening bilateral mid lung zone airspace opacities, concerning for worsening multifocal pneumonia. Suspect small left pleural effusion. 2. Mild cardiomegaly.   Electronically Signed   By: Roanna Raider M.D.   On: 03/07/2014 22:51    EKG: Personally Reviewed, LBBB is unchanged.  CXR: Personally Reviewed   ASSESSMENT / PLAN:  PULMONARY A: Acute Hypoxic Resp Failure: Unclear if 2/2 ARDS or fluid overload. PE not consistent with acute fluid overload. Recently evaluated for PE which was negative and was therapeutically anticoagulated until recently making it very unlikely.   COPD: P:   Trial of CPAP Low threshold for reintubation Agree with primary team attempt at diuresis BNP  CARDIOVASCULAR A: Systolic Heart Failure: Again, physical exam  not consistent with HF as etiology.  Cardiac arrest: Likely 2/2 respiratory cause. AF: No in sinus P:   Follow-up cards Strict I/O  RENAL A: CKD with possible AKI: Unclear baseline Hypokalemia:  P:   Serial BMPs  GASTROINTESTINAL A: No Acute Issue  HEMATOLOGIC A: Anemia: Likely AOCD P:    INFECTIOUS A: Possible Aspiration:  P:   Agree with vanc/zosyn  ENDOCRINE A: No acute issues  NEUROLOGIC A: Dysarthria:  Confusion: Likely delirium P:   MRI when stable   TODAY'S SUMMARY:   I have personally obtained a history, examined the patient, evaluated laboratory and imaging results, formulated the assessment and plan and placed orders.  CRITICAL CARE: The patient is critically ill with multiple organ systems failure and requires high complexity decision making for assessment and support, frequent evaluation and titration of therapies, application of advanced monitoring technologies and extensive interpretation of multiple databases. Critical Care Time devoted to patient care services described in this note is 60 minutes.   Melanie Casco, MD Pulmonary and Critical Care Medicine [facility] Pager: [phone]   03/08/2014, 12:05 AM

## 2014-03-07 NOTE — Progress Notes (Signed)
ANTIBIOTIC CONSULT NOTE - FOLLOW UP  Pharmacy Consult for Vancomycin and Zosyn Indication: pneumonia  No Known Allergies  Patient Measurements: Height: 5\' 3"  (160 cm) Weight: 147 lb 14.9 oz (67.1 kg) IBW/kg (Calculated) : 52.4 Adjusted Body Weight: 58 kg  Vital Signs: Temp: 99.1 F (37.3 C) (08/14 1131) Temp src: Oral (08/14 1131) BP: 177/74 mmHg (08/14 1100) Pulse Rate: 82 (08/14 1131) Intake/Output from previous day: 08/13 0701 - 08/14 0700 In: 841.3 [P.O.:240; I.V.:401.3; IV Piggyback:200] Out: 2550 [Urine:2550] Intake/Output from this shift: Total I/O In: 108 [I.V.:58; IV Piggyback:50] Out: 225 [Urine:225]  Labs:  Recent Labs  03/04/14 1259  03/05/14 0340 03/05/14 1600 03/06/14 0450 03/07/14 0250  WBC  --   --  12.1*  --  14.1* 14.0*  HGB  --   --  7.8*  --  11.0* 10.2*  PLT  --   --  199  --  212 242  LABCREA 16.00  --   --   --   --   --   CREATININE  --   < > 2.79* 2.70* 2.72* 2.45*  < > = values in this interval not displayed. Estimated Creatinine Clearance: 18.8 ml/min (by C-G formula based on Cr of 2.45). No results found for this basename: Rolm GalaVANCOTROUGH, VANCOPEAK, VANCORANDOM, GENTTROUGH, GENTPEAK, GENTRANDOM, TOBRATROUGH, TOBRAPEAK, TOBRARND, AMIKACINPEAK, AMIKACINTROU, AMIKACIN,  in the last 72 hours   Microbiology: Recent Results (from the past 720 hour(s))  URINE CULTURE     Status: None   Collection Time    03/23/2014  1:16 AM      Result Value Ref Range Status   Specimen Description URINE, CLEAN CATCH   Final   Special Requests NONE   Final   Culture  Setup Time     Final   Value: 03/01/2014 00:23     Performed at [facility]   Colony Count     Final   Value: NO GROWTH     [facility]formed [facility]   Culture     Final   Value: NO GROWTH     Performed at [facility]   Report Status 03/02/2014 FINAL   Final  MRSA PCR SCREENING     Status: None   Collection Time    03/14/2014  3:29 AM      Result Value Ref Range  Status   MRSA by PCR NEGATIVE  NEGATIVE Final   Comment:            The GeneXpert MRSA Assay (FDA     approved for NASAL specimens     only), is one component of a     comprehensive MRSA colonization     surveillance program. It is not     intended to diagnose MRSA     infection nor to guide or     monitor treatment for     MRSA infections.  CULTURE, BLOOD (ROUTINE X 2)     Status: None   Collection Time    03/09/2014  3:30 AM      Result Value Ref Range Status   Specimen Description BLOOD RIGHT ARM   Final   Special Requests BOTTLES DRAWN AEROBIC AND ANAEROBIC 5CC   Final   Culture  Setup Time     Final   Value: 02/22/2014 09:23     Performed at [facility]   Culture     Final   Value: NO GROWTH 5 DAYS     Performed at [facility]   Report Status 03/06/2014 FINAL   Final  CULTURE, BLOOD (ROUTINE X 2)     Status: None   Collection Time    03/07/2014  3:40 AM      Result Value Ref Range Status   Specimen Description BLOOD RIGHT HAND   Final   Special Requests BOTTLES DRAWN AEROBIC AND ANAEROBIC 5CC   Final   Culture  Setup Time     Final   Value: 03/03/2014 09:24     Performed at [facility]   Culture     Final   Value: NO GROWTH 5 DAYS     Performed at [facility]   Report Status 03/06/2014 FINAL   Final  CLOSTRIDIUM DIFFICILE BY PCR     Status: None   Collection Time    03/06/14  8:59 AM      Result Value Ref Range Status   C difficile by pcr NEGATIVE  NEGATIVE Final    Anti-infectives   Start     Dose/Rate Route Frequency Ordered Stop   03/07/14 1200  vancomycin (VANCOCIN) IVPB 1000 mg/200 mL premix     1,000 mg 200 mL/hr over 60 Minutes Intravenous  Once 03/07/14 1101     03/04/14 1500  piperacillin-tazobactam (ZOSYN) IVPB 2.25 g     2.25 g 100 mL/hr over 30 Minutes Intravenous 4 times per day 03/04/14 1426     03/02/14 1100  vancomycin (VANCOCIN) IVPB 750 mg/150 ml premix  Status:  Discontinued     750 mg 150 mL/hr over 60  Minutes Intravenous Every 24 hours 03/02/14 0813 03/04/14 1126   03/20/2014 1200  piperacillin-tazobactam (ZOSYN) IVPB 3.375 g  Status:  Discontinued     3.375 g 12.5 mL/hr over 240 Minutes Intravenous 3 times per day 02/22/2014 0926 03/04/14 1426   02/27/2014 1100  vancomycin (VANCOCIN) IVPB 1000 mg/200 mL premix  Status:  Discontinued     1,000 mg 200 mL/hr over 60 Minutes Intravenous Every 24 hours 02/27/2014 0926 03/02/14 0813   03/14/2014 1000  levofloxacin (LEVAQUIN) IVPB 500 mg  Status:  Discontinued     500 mg 100 mL/hr over 60 Minutes Intravenous Every 48 hours 03/05/2014 0843 03/03/14 1235   02/26/2014 0800  levofloxacin (LEVAQUIN) IVPB 500 mg  Status:  Discontinued     500 mg 100 mL/hr over 60 Minutes Intravenous Every 24 hours 03/15/2014 0239 02/23/2014 0843   03/21/2014 0245  levofloxacin (LEVAQUIN) IVPB 500 mg  Status:  Discontinued     500 mg 100 mL/hr over 60 Minutes Intravenous Every 24 hours 03/15/2014 0239 02/26/2014 0239      Assessment: Pt is a 73yo female on day 7 of zosyn for  presumed aspiration pneumonia now with suspected HCAP to restart vancomycin. Last dose of vancomycin was on 8/11. Renal function deteriorated rapidly from initial dosing of vanc/zosyn, but has started to trend down to 2.45 8/14. Cultures have been negative to date, but 8/12 CXR revealed worsening airspace disease. Pt has been afebrile, but has a persistent leukocytosis with WBC 14 on 8/14.  Levaquin 8/7>>8/10 Vanc 8/7>>8/11 Zosyn 8/7>> Vanc 8/14>>  8/7 UCx>>NEG 8/7 BCx2>>NEG  Goal of Therapy:  Vancomycin trough level 15-20 mcg/ml  Plan:  1. Restart vancomycin 1g q48h 2. Cont zosyn 2.25g q6h 3. Monitor renal function 4. Check vanc trough if significant change in renal function   Marquita Palms 03/07/2014,12:01 PM

## 2014-03-07 NOTE — Progress Notes (Signed)
Speech Language Pathology Treatment: Dysphagia  Patient Details Name: Melanie Williamson MRN: [id number] DOB: [birth year] Today's Date: 03/07/2014 Time: 1601-0932 SLP Time Calculation (min): 12 min  Assessment / Plan / Recommendation Clinical Impression  Pt. With noticeably increased confusion during session and required max cues for swallow precautions, orientation to place.  Trials thin administered for potential liquid upgrade with delayed coughs (baseline cough with difficulty associating to po's).  Prolonged mastication and transit of Dys 2 texture.  Given increased confusion today and clinical observations with Dys 2, will downgrade to Dys 1 texture and continue nectar thick liquids and ST.   HPI HPI: 73 year old female with PMH: HTN, hyperlipidenia admitted with respiratory disress and pt. found to be in PEA with CPR initiated.  CXR Left lower lung consolidations/atelectasis and small left pleural effusion. Found to have severe COPD.  INtubated 8/7-8/11.  ST ordered for ability to initiate po's.   Pertinent Vitals Pain Assessment: No/denies pain  SLP Plan  Continue with current plan of care    Recommendations Diet recommendations: Dysphagia 1 (puree);Nectar-thick liquid Liquids provided via: Cup;No straw Medication Administration: Whole meds with puree Supervision: Patient able to self feed;Staff to assist with self feeding;Full supervision/cueing for compensatory strategies Compensations: Slow rate;Small sips/bites Postural Changes and/or Swallow Maneuvers: Seated upright 90 degrees              Oral Care Recommendations: Oral care BID Follow up Recommendations: Inpatient Rehab Plan: Continue with current plan of care    GO     [name] 03/07/2014, 2:22 PM [phone]

## 2014-03-07 NOTE — Progress Notes (Signed)
[facility] ADULT ICU REPLACEMENT PROTOCOL FOR AM LAB REPLACEMENT ONLY  The patient does not apply for the [facility] Adult ICU Electrolyte Replacment Protocol based on the criteria listed below:      Abnormal electrolyte(s): K3.3   If a panic level lab has been reported, has the CCM MD in charge been notified? Yes.  .   Physician:  Orlean Bradford  Melrose Nakayama 03/07/2014 6:14 AM

## 2014-03-07 NOTE — Progress Notes (Signed)
[facility]  Clerance Lavora B Zachar XBJ:[id number] DOB: 12/06/1940 DOA: 02/26/2014 PCP: Warrick ParisianSTALLINGS,SHEILA, MD   Brief narrative: 73 yo with COPD (on nocturnal oxygen) admitted 8/6 after suffering a witnessed cardiac arrest at home. She was intubated in field and ROSC was achieved with 10 mins of CPR and epi x 3. Maximum total downtime 10-18 minutes based on EMS. Required intubation and hypothermic protocol.  After admit found with elevated TNI, new systolic HF with shock and AF requiring Amiodarone. It was also suspected she may have aspirated at time of arrest. After extubation she was confused but stable from a neurological standpoint but then began to slur her words and not using her right hand. Neurology was consulted and an MRI is pending. EEG done earlier while on the vent with sharp waves seen over the posterior quadrant regions indicate possible epileptogenic potential from the occipital and right posterior temporal regions. There were no electrographic seizures seen. She has subsequently developed more defined left basilar infiltrate with leukocytosis and is on broad spectrum anbx's  SIGNIFICANT EVENTS:  8/6 Admitted after cardiac arrest, hypothermia protocol started  8/8 Rewarming started at 6:30 am, off pressors  8/11 extubated, briefly required BIPAP for pulm edema  HPI/Subjective: Awake but confused. Seems very weak. Noted was leaning/slouched to left side  Assessment/Plan: Active Problems:   Cardiac arrest/ PEA /NSTEMI  -TNI peaked 2.97 -Cards suspects primary respiratory event -no cath at this time due to AMS and CKD with ARF    Acute respiratory failure with hypoxia:   A) Acute systolic congestive heart failure, NYHA class 2   B) HCAP (healthcare-associated pneumonia) -heart failure management per Cardiology -cont empiric anbx's -wean O2 as tolerated- currently on 5L -cont Zosyn -add Vancomycin since may be more HCAP than aspiration at this  point -Continue supportive care with when necessary nebs      Acute renal failure on CKD, stage III -baseline renal function 22/1.46 -today 51/2.45 in setting of aggressive diuresis for HF and recent suspected flash pulmonary edema    Altered mental state/Dysarthria -? If acute CVA in setting of AF this admit vs encephalopathy post code/arrest -Neuro following -MRI pending -SLP rec D2 diet    Leukocytosis -UA ws negative    HTN (hypertension) -BP controlled on Cardizem and hydralazine -Will allow permissive HTN until brain MRI returns. CVA?    Chronic respiratory failure with hypoxia/COPD  -on nocturnal O2 at home -not exacerbated      Atrial tachycardia/PAF -per Cards -cont Amiodarone and CCB -had short burst PAF today that responded to prn IV Lopressor   DVT prophylaxis: Subcutaneous heparin Code Status: Full Family Communication: No family at bedside Disposition Plan/Expected LOS: Stepdown   Consultants: Cardiology PCCM Neurology  Procedures: 8/7 Echo >> EF 35 to 40%, mod MR  8/7 Doppler legs b/l >> no DVT  Cultures: 8/7 blood cultures negative 8/13 C. difficile PCR negative 8/7 MRSA PCR negative 8/7 urine culture negative 8/7 respiratory viral panel negative  Antibiotics: Levaquin 8/7 >>> 8/9 Zosyn 8/7 >>> Vancomycin 8/7 >>> 8/11; resumed 8/14  Objective: Blood pressure 166/66, pulse 113, temperature 98.6 F (37 C), temperature source Oral, resp. rate 26, height 5\' 3"  (1.6 m), weight 147 lb 14.9 oz (67.1 kg), SpO2 96.00%.  Intake/Output Summary (Last 24 hours) at 03/07/14 1029 Last data filed at 03/07/14 0918  Gross per 24 hour  Intake 687.65 ml  Output   2475 ml  Net -1787.35 ml  Exam: Gen: No acute respiratory distress Neuro: Awake but confused, follows commands easily, speech slurred and difficult to understand, appears to have left facial droop, also appears to be weaker in leaning towards left side although with strength tested  appear to be symmetrical and equal although weak at 3-4/5 bilaterally Chest: Coarse to auscultation bilaterally without wheezes, rhonchi or crackles but somewhat diminished left base greater than right base, 5 L Cardiac: Regular rate and rhythm, S1-S2, no rubs murmurs or gallops, no peripheral edema, no JVD Abdomen: Soft nontender nondistended without obvious hepatosplenomegaly, no ascites Extremities: Symmetrical in appearance without cyanosis, clubbing or effusion Neurologic; A./O., cranial nerves II through XII intact, follows all commands, tongue/uvula midline, extremity strength 5/5, sensation intact throughout, expressive aphasia (word salad)  Scheduled Meds:  Scheduled Meds: . amiodarone  400 mg Oral BID  . antiseptic oral rinse  7 mL Mouth Rinse QID  . arformoterol  15 mcg Nebulization Q12H  . aspirin  81 mg Per Tube Daily  . atorvastatin  40 mg Oral q1800  . budesonide (PULMICORT) nebulizer solution  0.5 mg Nebulization BID  . chlorhexidine  15 mL Mouth Rinse BID  . diltiazem  30 mg Oral QID  . heparin subcutaneous  5,000 Units Subcutaneous 3 times per day  . hydrALAZINE  25 mg Oral TID  . ipratropium  0.5 mg Nebulization Q12H  . pantoprazole  40 mg Oral Daily  . piperacillin-tazobactam (ZOSYN)  IV  2.25 g Intravenous 4 times per day   Continuous Infusions: . sodium chloride Stopped (03/02/14 2200)  . nitroGLYCERIN 15 mcg/min (03/07/14 0600)    Data Reviewed: Basic Metabolic Panel:  Recent Labs Lab 03/02/14 0456  03/04/14 1800 03/05/14 0340 03/05/14 1600 03/06/14 0450 03/07/14 0250  NA 134*  < > 139 140 139 142 145  K 5.3  < > 3.4* 2.9* 3.5* 3.6* 3.3*  CL 103  < > 100 99 99 102 106  CO2 16*  < > 19 21 20 21 23   GLUCOSE 92  < > 96 55* 157* 86 109*  BUN 33*  < > 52* 56* 56* 55* 51*  CREATININE 1.79*  < > 2.55* 2.79* 2.70* 2.72* 2.45*  CALCIUM 7.0*  < > 7.5* 7.7* 8.3* 8.7 7.9*  MG 1.4*  --   --   --   --   --   --   < > = values in this interval not  displayed. Liver Function Tests:  Recent Labs Lab 03/02/14 0456  AST 36  ALT 24  ALKPHOS 73  BILITOT 0.3  PROT 5.4*  ALBUMIN 2.1*   No results found for this basename: LIPASE, AMYLASE,  in the last 168 hours No results found for this basename: AMMONIA,  in the last 168 hours CBC:  Recent Labs Lab 03/03/14 0450 03/04/14 0630 03/05/14 0340 03/06/14 0450 03/07/14 0250  WBC 18.5* 16.1* 12.1* 14.1* 14.0*  NEUTROABS  --   --   --  11.0* 10.9*  HGB 8.6* 8.6* 7.8* 11.0* 10.2*  HCT 25.0* 25.0* 22.6* 32.1* 29.6*  MCV 92.3 92.3 89.3 90.2 89.2  PLT 234 197 199 212 242   Cardiac Enzymes:  Recent Labs Lab March 13, 2014 1610 03-13-2014 2000 13-Mar-2014 2200 03/01/14 0400  TROPONINI 1.15* 1.42* 1.41* 1.40*   BNP (last 3 results)  Recent Labs  03/05/14 1058  PROBNP 38198.0*   CBG:  Recent Labs Lab 03/05/14 1605 03/05/14 1949 03/05/14 2324 03/06/14 0320 03/06/14 0759  GLUCAP 169* 91 91 77 79  Recent Results (from the past 240 hour(s))  URINE CULTURE     Status: None   Collection Time    03/12/2014  1:16 AM      Result Value Ref Range Status   Specimen Description URINE, CLEAN CATCH   Final   Special Requests NONE   Final   Culture  Setup Time     Final   Value: 03/01/2014 00:23     Performed at [facility] Count     Final   Value: NO GROWTH     Performed at [facility]   Culture     Final   Value: NO GROWTH     Performed at [facility]   Report Status 03/02/2014 FINAL   Final  MRSA PCR SCREENING     Status: None   Collection Time    03/20/2014  3:29 AM      Result Value Ref Range Status   MRSA by PCR NEGATIVE  NEGATIVE Final   Comment:            The GeneXpert MRSA Assay (FDA     approved for NASAL specimens     only), is one component of a     comprehensive MRSA colonization     surveillance program. It is not     intended to diagnose MRSA     infection nor to guide or     monitor treatment for     MRSA infections.   CULTURE, BLOOD (ROUTINE X 2)     Status: None   Collection Time    03/22/2014  3:30 AM      Result Value Ref Range Status   Specimen Description BLOOD RIGHT ARM   Final   Special Requests BOTTLES DRAWN AEROBIC AND ANAEROBIC 5CC   Final   Culture  Setup Time     Final   Value: 03/08/2014 09:23     Performed at [facility]   Culture     Final   Value: NO GROWTH 5 DAYS     Performed at [facility]   Report Status 03/06/2014 FINAL   Final  CULTURE, BLOOD (ROUTINE X 2)     Status: None   Collection Time    03/24/2014  3:40 AM      Result Value Ref Range Status   Specimen Description BLOOD RIGHT HAND   Final   Special Requests BOTTLES DRAWN AEROBIC AND ANAEROBIC 5CC   Final   Culture  Setup Time     Final   Value: 03/24/2014 09:24     Performed at [facility]   Culture     Final   Value: NO GROWTH 5 DAYS     Performed at [facility]   Report Status 03/06/2014 FINAL   Final  CLOSTRIDIUM DIFFICILE BY PCR     Status: None   Collection Time    03/06/14  8:59 AM      Result Value Ref Range Status   C difficile by pcr NEGATIVE  NEGATIVE Final     Studies:  Recent x-ray studies have been reviewed in detail by the Attending Physician  Time spent :      Junious Silk, ANP [facility] Office  [phone] Pager [phone]   **If unable to reach the above provider after paging please contact the Flow Manager @ [phone]  On-Call/Text Page:      [email or web address]      password [facility]  If 7PM-7AM, please contact night-coverage [email or web address] Password [email or web address] 03/07/2014, 10:29 AM   LOS: 7 days   Examined patient and discussed assessment and plan with ANP Revonda Standard, and agree with the above plan.  Patient with multiple complex medical problems > 40 minutes spent in direct patient care

## 2014-03-07 NOTE — Progress Notes (Signed)
SUBJECTIVE:  Very  Confused today  OBJECTIVE:   Vitals:   Filed Vitals:   03/07/14 0300 03/07/14 0400 03/07/14 0500 03/07/14 0600  BP: 151/62 180/70 145/71 158/64  Pulse: 82 82 97 65  Temp: 98.8 F (37.1 C)     TempSrc: Oral     Resp: 26 27 24 23   Height:      Weight: 147 lb 14.9 oz (67.1 kg)     SpO2: 91% 92% 94% 92%   I&O's:   Intake/Output Summary (Last 24 hours) at 03/07/14 0735 Last data filed at 03/07/14 0700  Gross per 24 hour  Intake 841.33 ml  Output   2550 ml  Net -1708.67 ml   TELEMETRY: Reviewed telemetry pt in NSR:     PHYSICAL EXAM General: Well developed, well nourished, in no acute distress Head: Eyes PERRLA, No xanthomas.   Normal cephalic and atramatic  Lungs:   Clear bilaterally to auscultation and percussion. Heart:   HRRR S1 S2 Pulses are 2+ & equal. Abdomen: Bowel sounds are positive, abdomen soft and non-tender without masses  Extremities:   No clubbing, cyanosis or edema.  DP +1 Neuro: Alert but very confused   LABS: Basic Metabolic Panel:  Recent Labs  16/94/50 0450 03/07/14 0250  NA 142 145  K 3.6* 3.3*  CL 102 106  CO2 21 23  GLUCOSE 86 109*  BUN 55* 51*  CREATININE 2.72* 2.45*  CALCIUM 8.7 7.9*   Liver Function Tests: No results found for this basename: AST, ALT, ALKPHOS, BILITOT, PROT, ALBUMIN,  in the last 72 hours No results found for this basename: LIPASE, AMYLASE,  in the last 72 hours CBC:  Recent Labs  03/06/14 0450 03/07/14 0250  WBC 14.1* 14.0*  NEUTROABS 11.0* 10.9*  HGB 11.0* 10.2*  HCT 32.1* 29.6*  MCV 90.2 89.2  PLT 212 242   Cardiac Enzymes: No results found for this basename: CKTOTAL, CKMB, CKMBINDEX, TROPONINI,  in the last 72 hours BNP: No components found with this basename: POCBNP,  D-Dimer: No results found for this basename: DDIMER,  in the last 72 hours Hemoglobin A1C: No results found for this basename: HGBA1C,  in the last 72 hours Fasting Lipid Panel: No results found for this  basename: CHOL, HDL, LDLCALC, TRIG, CHOLHDL, LDLDIRECT,  in the last 72 hours Thyroid Function Tests: No results found for this basename: TSH, T4TOTAL, FREET3, T3FREE, THYROIDAB,  in the last 72 hours Anemia Panel: No results found for this basename: VITAMINB12, FOLATE, FERRITIN, TIBC, IRON, RETICCTPCT,  in the last 72 hours Coag Panel:   Lab Results  Component Value Date   INR 1.11 2014-03-06   INR 1.27 Mar 06, 2014   INR 0.96 10/04/2013    RADIOLOGY: Ct Head Wo Contrast  03/02/2014   CLINICAL DATA:  COPD. Cardiac arrest at home on 02/27/2014. Maximum down time 10-18 min.  EXAM: CT HEAD WITHOUT CONTRAST  TECHNIQUE: Contiguous axial images were obtained from the base of the skull through the vertex without intravenous contrast.  COMPARISON:  2014/03/06  FINDINGS: There is mild central and cortical atrophy. Periventricular white matter changes are consistent with small vessel disease and appear stable. Chronic lacunar infarcts are again identified in the basal ganglia bilaterally. No calvarial fracture. There is atherosclerotic calcification of the internal carotid arteries.  IMPRESSION: 1. Atrophy and small vessel disease. 2. 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 3.  No evidence for acute intracranial abnormality.   Electronically Signed   By: Waynetta Sandy  Manson PasseyBrown M.D.   On: 03/02/2014 17:23   Ct Head Wo Contrast  03/19/2014   CLINICAL DATA:  Rule out bleed, post resuscitation.  EXAM: CT HEAD WITHOUT CONTRAST  TECHNIQUE: Contiguous axial images were obtained from the base of the skull through the vertex without intravenous contrast.  COMPARISON:  CT of the head report September 12, 1998 though images are not available for direct comparison.  FINDINGS: The ventricles and sulci are normal for age. No intraparenchymal hemorrhage, mass effect nor midline shift. Confluent supratentorial white matter hypodensities are within normal range for patient's age and though non-specific  suggest sequelae of chronic small vessel ischemic disease. No acute large vascular territory infarcts. Bilateral basal ganglia and thalamus lacunar infarcts.  No abnormal extra-axial fluid collections. Basal cisterns are patent. Severe calcific atherosclerosis of the carotid siphons and included vertebral arteries.  No skull fracture. The included ocular globes and orbital contents are non-suspicious. Sphenoid sinus air-fluid levels, paranasal sinus mucosal thickening. Mastoid air cells are well aerated.  IMPRESSION: No evidence of intracranial hemorrhage.  Involutional changes. Severe white matter changes suggest chronic small vessel ischemic disease with bilateral thalamus and basal ganglia lacunar infarcts.   Electronically Signed   By: Awilda Metroourtnay  Bloomer   On: 05-26-2014 03:10   [place] Renal Port  03/04/2014   CLINICAL DATA:  Elevated renal function tests  EXAM: RENAL/URINARY TRACT ULTRASOUND COMPLETE  COMPARISON:  None.  FINDINGS: Right Kidney:  Length: 10.7 cm. Echogenicity within normal limits. No mass or hydronephrosis visualized.  Left Kidney:  Length: 9.3 cm. Echogenicity within normal limits. No mass or hydronephrosis visualized.  Bladder:  Decompressed by Foley catheter.  IMPRESSION: Unremarkable renal ultrasound.  No hydronephrosis is noted.   Electronically Signed   By: Alcide CleverMark  Lukens M.D.   On: 03/04/2014 16:29   Dg Chest Port 1 View  03/05/2014   CLINICAL DATA:  Congestive heart failure.  Cough.  EXAM: PORTABLE CHEST - 1 VIEW  COMPARISON:  03/03/2014  FINDINGS: Endotracheal tube and nasogastric tube a been removed. Right internal jugular central line has its tip in the SVC 2 cm above the right atrium. There is worsened airspace density in volume loss an both mid and lower lungs, more extensive on the left than the right. This could be due to atelectasis or pneumonia. Pleural fluid is present on the left.  IMPRESSION: Worsening airspace density in the mid and lower lungs bilaterally, left more than  right. This could be atelectasis in or pneumonia. Endotracheal tube and nasogastric tube removed.   Electronically Signed   By: Paulina FusiMark  Shogry M.D.   On: 03/05/2014 10:13   Dg Chest Port 1 View  03/03/2014   CLINICAL DATA:  Followup of pneumonia.  EXAM: PORTABLE CHEST - 1 VIEW  COMPARISON:  03/02/2014  FINDINGS: Endotracheal tube 2.7 cm above carina. Nasogastric tube extends beyond the inferior aspect of the film. Right internal jugular line tip at low SVC.  Cardiomegaly accentuated by AP portable technique. Atherosclerosis in the transverse aorta. Mild to moderate left hemidiaphragm elevation. Small left pleural effusion. No pneumothorax. No congestive failure. Patchy left base airspace disease.  IMPRESSION: No significant change since the prior exam.  Small left pleural effusion with adjacent atelectasis or infection.   Electronically Signed   By: Jeronimo GreavesKyle  Talbot M.D.   On: 03/03/2014 07:39   Dg Chest Port 1 View  03/02/2014   CLINICAL DATA:  Respiratory failure  EXAM: PORTABLE CHEST - 1 VIEW  COMPARISON:  05-26-2014 and prior chest radiographs  FINDINGS: Cardiomegaly again identified.  An endotracheal tube with tip 2.4 cm above the carina, right IJ central venous catheter with tip overlying mid SVC, and NG tube entering the stomach with tip off the field of view identified.  Near complete resolution of pulmonary edema identified.  Left lower lung consolidations/atelectasis and small left pleural effusion noted.  There is no evidence of pneumothorax.  IMPRESSION: Near complete resolution of pulmonary edema.  Left lower lung consolidations/atelectasis and small left pleural effusion.  Support apparatus as described.   Electronically Signed   By: Laveda Abbe M.D.   On: 03/02/2014 07:40   Dg Chest Port 1 View  03/14/2014   CLINICAL DATA:  Status post central line placement.  EXAM: PORTABLE CHEST - 1 VIEW  COMPARISON:  Chest x-ray from the same day at 04:37 a.m.  FINDINGS: Stable heart size and upper mediastinal  contours. Unchanged diffuse pulmonary edema. No increasing pleural fluid. No appreciable pneumothorax.  New right IJ catheter, tip at the SVC. Unchanged and unremarkable positioning of endotracheal and orogastric tubes. Endotracheal tube ends between the clavicular heads and carina.  IMPRESSION: 1. New right IJ catheter, tip at the SVC.  No pneumothorax. 2. Unchanged pulmonary edema.   Electronically Signed   By: Tiburcio Pea M.D.   On: 02/27/2014 06:33   Dg Chest Port 1 View  03/08/2014   CLINICAL DATA:  Rule out pneumothorax.  History of hypertension.  EXAM: PORTABLE CHEST - 1 VIEW  COMPARISON:  Chest x-ray from the same day at 12:52 a.m.  FINDINGS: Unchanged borderline cardiomegaly. Stable aortic contours. The endotracheal tube ends between the clavicular heads and carina. The orogastric tube reaches the stomach. There is unchanged pulmonary edema. No evidence of effusion or pneumothorax.  IMPRESSION: 1. No evidence of pneumothorax. 2. Endotracheal and orogastric tubes remain in good position. 3. Unchanged pulmonary edema.   Electronically Signed   By: Tiburcio Pea M.D.   On: 03/03/2014 05:01   Dg Chest Port 1 View  02/25/2014   CLINICAL DATA:  Assess endotracheal tube.  Status post CPR.  EXAM: PORTABLE CHEST - 1 VIEW  COMPARISON:  Chest radiograph October 03, 2013  FINDINGS: Endotracheal tube tip projects 3.5 cm above the carina. Nasogastric tube side port projects in proximal stomach, distal tip not imaged. Cardiac silhouette appears mildly enlarged. Tortuous calcified aorta. Mediastinal silhouette is nonsuspicious. Increased lung volumes may reflect underlying COPD, unchanged. Diffuse interstitial prominence with patchy central alveolar airspace opacities. No pleural effusions. No pneumothorax.  Vascular calcifications projecting in the upper extremities. Osteopenia.  IMPRESSION: Endotracheal tube tip projects 3.5 cm above the carina, nasogastric tube past the proximal stomach.  Mild cardiomegaly,  interstitial and alveolar airspace opacities suggesting pulmonary edema, less likely ARDS.   Electronically Signed   By: Awilda Metro   On: 03/06/2014 01:06   Dg Abd Portable 1v  03/03/2014   CLINICAL DATA:  Check OG tube position  EXAM: PORTABLE ABDOMEN - 1 VIEW  COMPARISON:  None.  FINDINGS: OG tube tip is in the body or antrum of the stomach. Side port is in the body of the stomach.  Nonobstructive bowel gas pattern. No organomegaly or supine evidence of free air.  IMPRESSION: OG tube tip in the mid to distal stomach.   Electronically Signed   By: Charlett Nose M.D.   On: 03/03/2014 15:53   ASSESSMENT AND PLAN:  73 yo with history of COPD on home oxygen had PEA arrest yesterday, now intubated and s/p cooling  1. Cardiac arrest: PEA. Suspect primary respiratory arrest. Now rewarmed and off sedation. Head CT with nothing acute. She is awake and eyes are moving. EEG showed diffuse cerebral dysfunction that is nonspecific c/w hypoxic/ischemic injury.  She appears very confused and slurring her words.  For MRI today 2. Pulmonary: Severe COPD, suspect respiratory infection/COPD exacerbation is driving this. She is on vanco/Zosyn. PCT elevated at 7.2. PCCM managing. Chest xray showed worsening airspace disease mid and lower lungs bilaterally, left more than right worrisome for PNA. WBC remains elevated. Continue Zosyn 3. Hypotension: resolved and now markedly hypertensive with SBP  - Continue IV NTG gtt and titrate to keep SBP < 150 and DBP <67mmHg - increase Hydralazine 25mg  TID  - would avoid titrating cardiazem any further due to reduced LVF  4. Tachycardia - now in NSR on PO Cardizem and Amio. No BB secondary to severe COPD  5. Elevated troponin: Mild TnI elevation to 1.4, steady. Suspect this is most likely demand ischemia related to PEA arrest/hypotension. Has baseline LBBB. Echo showed EF 35-40% with wall motion abnormalities, this is worse than prior (EF 50%). Could be stunning related  to arrest.  - Continue ASA, statin - no BB secondary to severe COPD  - At this time would not pursue cath due to worsening renal function and worsening neuro status 6. Hyponatremia/Hypokalemia - replete  7. A/C renal failure, stage IV - creatinine better today 8. Anemia - received 1 unit PRBCs this am  9. Acute systolic CHF - she put out 1.7L yesterday and is 1.8L net neg.  She is 2 lbs down.   Quintella Reichert, MD  03/07/2014  7:35 AM

## 2014-03-08 ENCOUNTER — Encounter (HOSPITAL_COMMUNITY): Payer: Self-pay | Admitting: Internal Medicine

## 2014-03-08 DIAGNOSIS — I469 Cardiac arrest, cause unspecified: Secondary | ICD-10-CM

## 2014-03-08 DIAGNOSIS — I4891 Unspecified atrial fibrillation: Secondary | ICD-10-CM

## 2014-03-08 DIAGNOSIS — J189 Pneumonia, unspecified organism: Secondary | ICD-10-CM

## 2014-03-08 DIAGNOSIS — J9811 Atelectasis: Secondary | ICD-10-CM | POA: Diagnosis present

## 2014-03-08 DIAGNOSIS — J9620 Acute and chronic respiratory failure, unspecified whether with hypoxia or hypercapnia: Secondary | ICD-10-CM

## 2014-03-08 DIAGNOSIS — N189 Chronic kidney disease, unspecified: Secondary | ICD-10-CM

## 2014-03-08 LAB — BASIC METABOLIC PANEL
Anion gap: 18 — ABNORMAL HIGH (ref 5–15)
BUN: 56 mg/dL — ABNORMAL HIGH (ref 6–23)
CHLORIDE: 102 meq/L (ref 96–112)
CO2: 24 meq/L (ref 19–32)
CREATININE: 2.57 mg/dL — AB (ref 0.50–1.10)
Calcium: 9.5 mg/dL (ref 8.4–10.5)
GFR calc Af Amer: 20 mL/min — ABNORMAL LOW (ref >90)
GFR calc non Af Amer: 17 mL/min — ABNORMAL LOW (ref >90)
Glucose, Bld: 123 mg/dL — ABNORMAL HIGH (ref 70–99)
Potassium: 4.3 mEq/L (ref 3.7–5.3)
SODIUM: 144 meq/L (ref 137–147)

## 2014-03-08 LAB — CBC WITH DIFFERENTIAL/PLATELET
BASOS PCT: 0 % (ref 0–1)
Basophils Absolute: 0 10*3/uL (ref 0.0–0.1)
EOS PCT: 0 % (ref 0–5)
Eosinophils Absolute: 0 10*3/uL (ref 0.0–0.7)
HCT: 31.7 % — ABNORMAL LOW (ref 36.0–46.0)
HEMOGLOBIN: 10.4 g/dL — AB (ref 12.0–15.0)
LYMPHS PCT: 12 % (ref 12–46)
Lymphs Abs: 2 10*3/uL (ref 0.7–4.0)
MCH: 30.1 pg (ref 26.0–34.0)
MCHC: 32.8 g/dL (ref 30.0–36.0)
MCV: 91.9 fL (ref 78.0–100.0)
MONO ABS: 1.3 10*3/uL — AB (ref 0.1–1.0)
MONOS PCT: 8 % (ref 3–12)
NEUTROS PCT: 80 % — AB (ref 43–77)
Neutro Abs: 13.2 10*3/uL — ABNORMAL HIGH (ref 1.7–7.7)
PLATELETS: 269 10*3/uL (ref 150–400)
RBC: 3.45 MIL/uL — ABNORMAL LOW (ref 3.87–5.11)
RDW: 14.4 % (ref 11.5–15.5)
WBC: 16.5 10*3/uL — AB (ref 4.0–10.5)

## 2014-03-08 LAB — BLOOD GAS, VENOUS
ACID-BASE EXCESS: 1 mmol/L (ref 0.0–2.0)
BICARBONATE: 24.4 meq/L — AB (ref 20.0–24.0)
Drawn by: 369891
O2 Saturation: 68.1 %
PATIENT TEMPERATURE: 98.6
PO2 VEN: 38.1 mmHg (ref 30.0–45.0)
TCO2: 25.4 mmol/L (ref 0–100)
pCO2, Ven: 34.2 mmHg — ABNORMAL LOW (ref 45.0–50.0)
pH, Ven: 7.467 — ABNORMAL HIGH (ref 7.250–7.300)

## 2014-03-08 LAB — CARBOXYHEMOGLOBIN
CARBOXYHEMOGLOBIN: 1 % (ref 0.5–1.5)
Methemoglobin: 0.9 % (ref 0.0–1.5)
O2 Saturation: 83 %
Total hemoglobin: 11.2 g/dL — ABNORMAL LOW (ref 12.0–16.0)

## 2014-03-08 LAB — TROPONIN I
Troponin I: 1.13 ng/mL (ref <0.30)
Troponin I: 1.19 ng/mL (ref <0.30)
Troponin I: 1.1 ng/mL (ref <0.30)

## 2014-03-08 LAB — SEDIMENTATION RATE: SED RATE: 122 mm/h — AB (ref 0–22)

## 2014-03-08 LAB — LACTIC ACID, PLASMA: Lactic Acid, Venous: 1.2 mmol/L (ref 0.5–2.2)

## 2014-03-08 LAB — PRO B NATRIURETIC PEPTIDE: PRO B NATRI PEPTIDE: 50443 pg/mL — AB (ref 0–125)

## 2014-03-08 MED ORDER — METHYLPREDNISOLONE SODIUM SUCC 125 MG IJ SOLR
60.0000 mg | Freq: Two times a day (BID) | INTRAMUSCULAR | Status: DC
  Administered 2014-03-08 – 2014-03-09 (×2): 60 mg via INTRAVENOUS
  Filled 2014-03-08: qty 0.96
  Filled 2014-03-08: qty 2
  Filled 2014-03-08: qty 0.96

## 2014-03-08 MED ORDER — MORPHINE SULFATE 2 MG/ML IJ SOLN
1.0000 mg | INTRAMUSCULAR | Status: DC | PRN
  Administered 2014-03-09: 1 mg via INTRAVENOUS
  Filled 2014-03-08: qty 1

## 2014-03-08 MED ORDER — LORAZEPAM 2 MG/ML IJ SOLN
1.0000 mg | INTRAMUSCULAR | Status: DC | PRN
  Administered 2014-03-08 – 2014-03-09 (×3): 1 mg via INTRAVENOUS
  Administered 2014-03-09: 2 mg via INTRAVENOUS
  Filled 2014-03-08 (×4): qty 1

## 2014-03-08 MED ORDER — HYDRALAZINE HCL 50 MG PO TABS
50.0000 mg | ORAL_TABLET | Freq: Three times a day (TID) | ORAL | Status: DC
  Administered 2014-03-08 – 2014-03-12 (×13): 50 mg via ORAL
  Filled 2014-03-08 (×17): qty 1

## 2014-03-08 MED ORDER — IPRATROPIUM-ALBUTEROL 0.5-2.5 (3) MG/3ML IN SOLN
3.0000 mL | Freq: Four times a day (QID) | RESPIRATORY_TRACT | Status: DC
  Administered 2014-03-08 – 2014-03-15 (×29): 3 mL via RESPIRATORY_TRACT
  Filled 2014-03-08 (×31): qty 3

## 2014-03-08 MED ORDER — HYDRALAZINE HCL 25 MG PO TABS
35.0000 mg | ORAL_TABLET | Freq: Three times a day (TID) | ORAL | Status: DC
  Filled 2014-03-08 (×3): qty 1

## 2014-03-08 NOTE — Progress Notes (Signed)
TEAM 1 - Stepdown/ICU TEAM Progress Note  Melanie Williamson BJY:[id number] DOB: May 08, 1941 DOA: 02/26/2014 PCP: Warrick Parisian, MD  Admit HPI / Brief Narrative: 73 yo WF PMHx anxiety, COPD (on nocturnal oxygen), HTN, paroxysmal A. fib/atrial tachycardia, HLD admitted 8/6 after suffering a witnessed cardiac arrest at home. She was intubated in field and ROSC was achieved with 10 mins of CPR and epi x 3. Maximum total downtime 10-18 minutes based on EMS. Required intubation and hypothermic protocol.  After admit found with elevated TNI, new systolic HF with shock and AF requiring Amiodarone. It was also suspected she may have aspirated at time of arrest. After extubation she was confused but stable from a neurological standpoint but then began to slur her words and not using her right hand. Neurology was consulted and an MRI is pending. EEG done earlier while on the vent with sharp waves seen over the posterior quadrant regions indicate possible epileptogenic potential from the occipital and right posterior temporal regions. There were no electrographic seizures seen. She has subsequently developed more defined left basilar infiltrate with leukocytosis and is on broad spectrum anbx's   HPI/Subjective: Patient alert, unable to communicate verbally secondary to air hunger. But will nod her head to simple questions and follow commands for exam.  Assessment/Plan: Cardiac arrest/ PEA /NSTEMI  -TNI peaked 2.97  -Cards suspects primary respiratory event  -no cath at this time due to AMS and CKD with ARF   Acute respiratory failure with hypoxia:  -Patient's PCXR from 8/14 consistent with a pneumonitis (amiodarone?). Cardiology has discontinued amiodarone -Little to no air movement throughout left lung field start Solu-Medrol 60 mg BID -Start DuoNeb QID  Acute systolic congestive heart failure, NYHA class 2 -Amiodarone discontinued by cardiology secondary to possible pneumonitis   HCAP  (healthcare-associated pneumonia)  -heart failure management per Cardiology  -Although not fully convinced this is HCAP, given patient's comorbidities and high risk will continue empiric antibiotics.   -wean O2 as tolerated  -DuoNeb QID -Xopenex PRN  -Solu-Medrol 60 mg BID -Morphine1-2 mg q 4 PRN   Acute renal failure on CKD, stage III  -baseline renal function 22/1.46  -8./15 today 56/2.57 trending up?  -Diuresis discontinued per cardiology; CVP= 3-4 which is within normal limits   Altered mental state/Dysarthria  -? If acute CVA in setting of AF this admit vs encephalopathy post code/arrest  -Neuro following  -MRI still pending secondary to patient's inability to lay flat and maintain saturation, and anxiety. Will try to stabilize pulmonary status overnight for possible MRI in the a.m. -8/15 SLP rec D2 diet    Leukocytosis  -UA was negative   HTN (hypertension)  -BP controlled on Cardizem and hydralazine  -Patient now 7 days out from initial hospitalization, even if patient has a CVA safe to obtain better BP control.    Chronic respiratory failure with hypoxia/COPD  -on nocturnal O2 at home  -not exacerbated   Atrial tachycardia/PAF  -DC amiodarone  -cont Cardizem 30 mg QID - Hydralazine 50 mg TID -had short burst PAF today that responded to prn IV Lopressor   Code Status: FULL Family Communication: Granddaughter present  Disposition Plan: Improvement mental status and pulmonary function    Consultants: Dr. Armanda Magic (cardiology) Dr. Evalyn Casco (PCCM.)    Procedure/Significant Events: 8/6 CT head >>> nad  8/7 Echo >> EF 35 to 40%, mod MR (new from 10/05/2013)  8/7 Doppler legs b/l >> no DVT  8/8 Rewarming started at 6:30 am, off pressors  8/10 CT head > atrophy and small vessel disease, chronic lacunar infarcts stable, no intracranial abnormality  8/10 EEG>>diffuse cerebral dysfunction that is non-specific in etiology and can be seen with  hypoxic/ischemic injury, toxic/metabolic encephalopathies, or medication effect. Sharp waves seen over the posterior quadrant regions indicate possible epileptogenic potential from the occipital and right posterior temporal regions. There were no electrographic seizures seen in this study  8/11 renal ultrasound > no hydro  8/11 extubated, briefly required BIPAP for pulm edema 8/14 PCXR;1 Persistent Lt basilar airspace opacity, w/ worsening bilateral mid lung zone airspace opacities, concerning for worsening multifocal pneumonia.    Culture 8/7 blood cultures negative  8/13 C. difficile PCR negative  8/7 MRSA PCR negative  8/7 urine culture negative  8/7 respiratory viral panel negative   Antibiotics: Levaquin 8/7 >>> 8/9  Zosyn 8/7 >>>  Vancomycin 8/7 >>> 8/11; resumed 8/14   DVT prophylaxis: Subcutaneous heparin   Devices NA   LINES / TUBES:  8/11 RIJ triple-lumen    Continuous Infusions: . sodium chloride 10 mL/hr at 03/08/14 1100  . nitroGLYCERIN 20 mcg/min (03/08/14 1000)    Objective: VITAL SIGNS: Temp: 98.8 F (37.1 C) (08/15 1101) Temp src: Oral (08/15 1101) BP: 126/52 mmHg (08/15 1101) Pulse Rate: 95 (08/15 1101) SPO2; 99% on nonrebreather; 15 L/min FIO2: 97%   Intake/Output Summary (Last 24 hours) at 03/08/14 1234 Last data filed at 03/08/14 1100  Gross per 24 hour  Intake 935.25 ml  Output   2625 ml  Net -1689.75 ml     Exam: General: A./O. x4, positive acute respiratory distress moderate to severe. Patient will answer questions with shakes her head and hunching of shoulder (yesterday able to converse). Lungs: Very little air movement in the left lung field, positive inspiratory wheezing, right lung field coarse breath sounds however air movement in all lobes  Cardiovascular: Tachycardic, Regular rhythm without murmur gallop or rub normal S1 and S2 Abdomen: Nontender, nondistended, soft, bowel sounds positive, no rebound, no ascites, no  appreciable mass Extremities: No significant cyanosis, clubbing, or edema bilateral lower extremities Neuro: Awake but confused, follows commands, appears to have left facial droop, also appears to be weaker in leaning towards left side although with strength tested appear to be symmetrical and equal although weak at 3-4/5 bilaterally    Data Reviewed: Basic Metabolic Panel:  Recent Labs Lab 03/02/14 0456  03/05/14 0340 03/05/14 1600 03/06/14 0450 03/07/14 0250 03/08/14 0305  NA 134*  < > 140 139 142 145 144  K 5.3  < > 2.9* 3.5* 3.6* 3.3* 4.3  CL 103  < > 99 99 102 106 102  CO2 16*  < > 21 20 21 23 24   GLUCOSE 92  < > 55* 157* 86 109* 123*  BUN 33*  < > 56* 56* 55* 51* 56*  CREATININE 1.79*  < > 2.79* 2.70* 2.72* 2.45* 2.57*  CALCIUM 7.0*  < > 7.7* 8.3* 8.7 7.9* 9.5  MG 1.4*  --   --   --   --   --   --   < > = values in this interval not displayed. Liver Function Tests:  Recent Labs Lab 03/02/14 0456  AST 36  ALT 24  ALKPHOS 73  BILITOT 0.3  PROT 5.4*  ALBUMIN 2.1*   No results found for this basename: LIPASE, AMYLASE,  in the last 168 hours No results found for this basename: AMMONIA,  in the last 168 hours CBC:  Recent Labs Lab 03/03/14  [id number] 03/04/14 0630 03/05/14 0340 03/06/14 0450 03/07/14 0250  WBC 18.5* 16.1* 12.1* 14.1* 14.0*  NEUTROABS  --   --   --  11.0* 10.9*  HGB 8.6* 8.6* 7.8* 11.0* 10.2*  HCT 25.0* 25.0* 22.6* 32.1* 29.6*  MCV 92.3 92.3 89.3 90.2 89.2  PLT 234 197 199 212 242   Cardiac Enzymes:  Recent Labs Lab 03/08/14 0032 03/08/14 0632  TROPONINI 1.13* 1.19*   BNP (last 3 results)  Recent Labs  03/05/14 1058 03/08/14 0032  PROBNP 38198.0* 50443.0*   CBG:  Recent Labs Lab 03/05/14 1605 03/05/14 1949 03/05/14 2324 03/06/14 0320 03/06/14 0759  GLUCAP 169* 91 91 77 79    Recent Results (from the past 240 hour(s))  URINE CULTURE     Status: None   Collection Time    01/30/14  1:16 AM      Result Value Ref Range  Status   Specimen Description URINE, CLEAN CATCH   Final   Special Requests NONE   Final   Culture  Setup Time     Final   Value: 03/01/2014 00:23     [facility]formed [facility]   Colony Count     Final   Value: NO GROWTH     [facility]formed [facility]   Culture     Final   Value: NO GROWTH     Performed at [facility]   Report Status 03/02/2014 FINAL   Final  MRSA PCR SCREENING     Status: None   Collection Time    01/30/14  3:29 AM      Result Value Ref Range Status   MRSA by PCR NEGATIVE  NEGATIVE Final   Comment:            The GeneXpert MRSA Assay (FDA     approved for NASAL specimens     only), is one component of a     comprehensive MRSA colonization     surveillance program. It is not     intended to diagnose MRSA     infection nor to guide or     monitor treatment for     MRSA infections.  CULTURE, BLOOD (ROUTINE X 2)     Status: None   Collection Time    01/30/14  3:30 AM      Result Value Ref Range Status   Specimen Description BLOOD RIGHT ARM   Final   Special Requests BOTTLES DRAWN AEROBIC AND ANAEROBIC 5CC   Final   Culture  Setup Time     Final   Value: 12-24-2013 09:23     Performed at [facility]   Culture     Final   Value: NO GROWTH 5 DAYS     Performed at [facility]   Report Status 03/06/2014 FINAL   Final  CULTURE, BLOOD (ROUTINE X 2)     Status: None   Collection Time    01/30/14  3:40 AM      Result Value Ref Range Status   Specimen Description BLOOD RIGHT HAND   Final   Special Requests BOTTLES DRAWN AEROBIC AND ANAEROBIC 5CC   Final   Culture  Setup Time     Final   Value: 12-24-2013 09:24     Performed at [facility]   Culture     Final   Value: NO GROWTH 5 DAYS     Performed at [facility]   Report Status 03/06/2014  FINAL   Final  CLOSTRIDIUM DIFFICILE BY PCR     Status: None   Collection Time    03/06/14  8:59 AM      Result Value Ref Range Status   C difficile by pcr  NEGATIVE  NEGATIVE Final     Studies:  Recent x-ray studies have been reviewed in detail by the Attending Physician  Scheduled Meds:  Scheduled Meds: . antiseptic oral rinse  7 mL Mouth Rinse QID  . arformoterol  15 mcg Nebulization Q12H  . aspirin  81 mg Per Tube Daily  . atorvastatin  40 mg Oral q1800  . budesonide (PULMICORT) nebulizer solution  0.5 mg Nebulization BID  . chlorhexidine  15 mL Mouth Rinse BID  . diltiazem  30 mg Oral QID  . heparin subcutaneous  5,000 Units Subcutaneous 3 times per day  . hydrALAZINE  50 mg Oral TID  . ipratropium  0.5 mg Nebulization Q12H  . pantoprazole  40 mg Oral Daily  . piperacillin-tazobactam (ZOSYN)  IV  2.25 g Intravenous 4 times per day  . [START ON 03/09/2014] vancomycin  1,000 mg Intravenous Q48H    Time spent on care of this patient: 40 mins   Drema Dallas , MD   [facility] Office  [phone] Pager [phone]  On-Call/Text Page:      [email or web address]      password [email or web address]  If 7PM-7AM, please contact night-coverage [email or web address] Password [facility] 03/08/2014, 12:34 PM   LOS: 8 days

## 2014-03-08 NOTE — Progress Notes (Addendum)
Called again to see patient for respiratory distress. In the interim unable to tolerate CPAP. On arrival satting ~ 99% on NRB. Denies dyspnea. Exam unchanged. VBG unchanged.   At this point appears to be stable. May require intubation or NIMV but not yet.    CRITICAL CARE: The patient is critically ill with multiple organ systems failure and requires high complexity decision making for assessment and support, frequent evaluation and titration of therapies, application of advanced monitoring technologies and extensive interpretation of multiple databases. Critical Care Time devoted to patient care services described in this note is 15 minutes. This is in addition to the 60 min already documented earlier today.

## 2014-03-08 NOTE — Progress Notes (Signed)
PULMONARY  / CRITICAL CARE MEDICINE CONSULTATION   Name: Melanie Williamson MRN: [id number] DOB: March 30, 1941    ADMISSION DATE:  Mar 10, 2014 CONSULTATION DATE: 03/07/2014  REQUESTING CLINICIAN: Dr. Joseph Art PRIMARY SERVICE: TRH  CHIEF COMPLAINT:  SOB  BRIEF PATIENT DESCRIPTION: 30 F with COPD admitted 8/7 for PEA arrest presumed 2/2 respiratory failure s/p rewarming with worsening respiratory distress evening of 8/14. Unclear if 2/2 pulmonary edema or ARDS. PCCM asked to consult.  SIGNIFICANT EVENTS / STUDIES:  8/6 CT head >>> nad  8/7 Echo >> EF 35 to 40%, mod MR (new from 10/05/2013) 8/7 Doppler legs b/l >> no DVT  8/8 Rewarming started at 6:30 am, off pressors  8/10 CT head > atrophy and small vessel disease, chronic lacunar infarcts stable, no intracranial abnormality  8/10 EEG>>diffuse cerebral dysfunction that is non-specific in etiology and can be seen with hypoxic/ischemic injury, toxic/metabolic encephalopathies, or medication effect. Sharp waves seen over the posterior quadrant regions indicate possible epileptogenic potential from the occipital and right posterior temporal regions. There were no electrographic seizures seen in this study  8/11 renal ultrasound > no hydro 8/11 extubated, briefly required BIPAP for pulm edema 8/14 Trf to SDU ordered 8/14 PCCM reconsulted for worsening resp failure with hypoxemia. CXR showed LLL collapse with cutoff sign 8/15 Comfortable on NRB.Chest percussion and nebulized BDs ordered  LINES / TUBES: R IJ CVL 8/7 >>   CULTURES: Blood 8/14 >>  Resp 8/14 >>   ANTIBIOTICS: Levaquin 8/7-8/9 Zosyn 8/7 >>  Vanc 8/7  >>    SUBJECTIVE:  Lethargic but F/C sluggishly. NAD  VITAL SIGNS: Temp:  [98 F (36.7 C)-98.9 F (37.2 C)] 98.9 F (37.2 C) (08/15 1600) Pulse Rate:  [71-101] 98 (08/15 1800) Resp:  [16-30] 18 (08/15 1800) BP: (95-174)/(43-108) 129/73 mmHg (08/15 1800) SpO2:  [74 %-99 %] 99 % (08/15 1800) FiO2 (%):  [55 %-100 %] 97 % (08/15  1800) Weight:  [64.7 kg (142 lb 10.2 oz)] 64.7 kg (142 lb 10.2 oz) (08/15 0500) HEMODYNAMICS: CVP:  [4 mmHg] 4 mmHg VENTILATOR SETTINGS: Vent Mode:  [-]  FiO2 (%):  [55 %-100 %] 97 % INTAKE / OUTPUT: Intake/Output     08/15 0701 - 08/16 0700   P.O. 120   I.V. (mL/kg) 276 (4.3)   IV Piggyback 100   Total Intake(mL/kg) 496 (7.7)   Urine (mL/kg/hr) 900 (1.1)   Total Output 900   Net -404         PHYSICAL EXAMINATION: General:  Thin, frail, NAD Neuro:  No focal deficits noted, diffusely weak HEENT:  WNL Neck: No JVD noted Cardiovascular:  Reg, no M Lungs: absent BS in LLL, L>R basilar crackles Abdomen:  S/NT/ND/(+)BS Ext:  (-) C/C/E Skin:  Diffuse ecchymosis  LABS: I have reviewed all of today's lab results. Relevant abnormalities are discussed in the A/P section  CXR: NNF  ASSESSMENT / PLAN:  PULMONARY A: Acute Hypoxic Resp Failure due to LLL collapse and prob LLL mucus plugging  COPD Suspected PNA P:   Airway hygiene with chest percussion, BDs, PRN NTS Cont O2 to maintain SpO2 > 90%  CARDIOVASCULAR A: Systolic Heart Failure, compensated.  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  CRITICAL CARE: The patient is critically ill with multiple organ systems failure and requires high complexity decision making for assessment and support, frequent evaluation and titration of therapies,  application of advanced monitoring technologies and extensive interpretation of multiple databases. Critical Care Time devoted to patient care services described in this note is 35 minutes.   Billy Fischeravid Alexander Mcauley, MD ; [facility] Mobile [phone].  After 5:30 PM or weekends, call [phone]   03/08/2014, 7:25 PM

## 2014-03-08 NOTE — Progress Notes (Signed)
Pharmacist Heart Failure Core Measure Documentation  Assessment: Melanie Williamson has an EF documented as 35-40% on  by ECHO.  Rationale: Heart failure patients with left ventricular systolic dysfunction (LVSD) and an EF < 40% should be prescribed an angiotensin converting enzyme inhibitor (ACEI) or angiotensin receptor blocker (ARB) at discharge unless a contraindication is documented in the medical record.  This patient is not currently on an ACEI or ARB for HF.  This note is being placed in the record in order to provide documentation that a contraindication to the use of these agents is present for this encounter.  ACE Inhibitor or Angiotensin Receptor Blocker is contraindicated (specify all that apply)  []   ACEI allergy AND ARB allergy []   Angioedema []   Moderate or severe aortic stenosis []   Hyperkalemia []   Hypotension []   Renal artery stenosis [x]   Worsening renal function, preexisting renal disease or dysfunction   Gardner Candle 03/08/2014 3:20 PM

## 2014-03-08 NOTE — Progress Notes (Signed)
Pt is asleep at this time, CPT is not performed per order while awake. RT will continue to monitor and check at a later time.

## 2014-03-08 NOTE — Progress Notes (Signed)
Pt became anxious and desated while on Bipap. Placed pt back on NRB, and sats have improved.

## 2014-03-08 NOTE — Progress Notes (Addendum)
SUBJECTIVE:  Developed respiratory distress last PM and currently on 100% NRB  OBJECTIVE:   Vitals:   Filed Vitals:   03/08/14 0600 03/08/14 0700 03/08/14 0727 03/08/14 0800  BP: 162/77 163/72 163/72   Pulse: 92 94 94   Temp:   98 F (36.7 C)   TempSrc:   Axillary   Resp: 24 25 22    Height:      Weight:      SpO2: 96% 98% 99% 99%   I&O's:   Intake/Output Summary (Last 24 hours) at 03/08/14 0806 Last data filed at 03/08/14 0600  Gross per 24 hour  Intake 1172.25 ml  Output   2375 ml  Net -1202.75 ml   TELEMETRY: Reviewed telemetry pt in sinus tachycardia     PHYSICAL EXAM General: Well developed, well nourished, in moderate respiratory distress Head: Eyes PERRLA, No xanthomas.   Normal cephalic and atramatic  Lungs:   Decrease BS at left base and clear on right Heart:   HRRR S1 S2 Pulses are 2+ & equal. Abdomen: Bowel sounds are positive, abdomen soft and non-tender without masses  Extremities:   No clubbing, cyanosis or edema.  DP +1 Neuro: Alert but appears confused    LABS: Basic Metabolic Panel:  Recent Labs  16/10/96 0250 03/08/14 0305  NA 145 144  K 3.3* 4.3  CL 106 102  CO2 23 24  GLUCOSE 109* 123*  BUN 51* 56*  CREATININE 2.45* 2.57*  CALCIUM 7.9* 9.5   Liver Function Tests: No results found for this basename: AST, ALT, ALKPHOS, BILITOT, PROT, ALBUMIN,  in the last 72 hours No results found for this basename: LIPASE, AMYLASE,  in the last 72 hours CBC:  Recent Labs  03/06/14 0450 03/07/14 0250  WBC 14.1* 14.0*  NEUTROABS 11.0* 10.9*  HGB 11.0* 10.2*  HCT 32.1* 29.6*  MCV 90.2 89.2  PLT 212 242   Cardiac Enzymes:  Recent Labs  03/08/14 0032 03/08/14 0632  TROPONINI 1.13* 1.19*   BNP: No components found with this basename: POCBNP,  D-Dimer: No results found for this basename: DDIMER,  in the last 72 hours Hemoglobin A1C: No results found for this basename: HGBA1C,  in the last 72 hours Fasting Lipid Panel: No results found  for this basename: CHOL, HDL, LDLCALC, TRIG, CHOLHDL, LDLDIRECT,  in the last 72 hours Thyroid Function Tests: No results found for this basename: TSH, T4TOTAL, FREET3, T3FREE, THYROIDAB,  in the last 72 hours Anemia Panel: No results found for this basename: VITAMINB12, FOLATE, FERRITIN, TIBC, IRON, RETICCTPCT,  in the last 72 hours Coag Panel:   Lab Results  Component Value Date   INR 1.11 Mar 25, 2014   INR 1.27 March 25, 2014   INR 0.96 10/04/2013    RADIOLOGY: Ct Head Wo Contrast  03/02/2014   CLINICAL DATA:  COPD. Cardiac arrest at home on 02/27/2014. Maximum down time 10-18 min.  EXAM: CT HEAD WITHOUT CONTRAST  TECHNIQUE: Contiguous axial images were obtained from the base of the skull through the vertex without intravenous contrast.  COMPARISON:  Mar 25, 2014  FINDINGS: There is mild central and cortical atrophy. Periventricular white matter changes are consistent with small vessel disease and appear stable. Chronic lacunar infarcts are again identified in the basal ganglia bilaterally. No calvarial fracture. There is atherosclerotic calcification of the internal carotid arteries.  IMPRESSION: 1. Atrophy and small vessel disease. 2. 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 3.  No evidence for acute intracranial abnormality.   Electronically Signed  By: Rosalie Gums M.D.   On: 03/02/2014 17:23   Ct Head Wo Contrast  02/27/2014   CLINICAL DATA:  Rule out bleed, post resuscitation.  EXAM: CT HEAD WITHOUT CONTRAST  TECHNIQUE: Contiguous axial images were obtained from the base of the skull through the vertex without intravenous contrast.  COMPARISON:  CT of the head report September 12, 1998 though images are not available for direct comparison.  FINDINGS: The ventricles and sulci are normal for age. No intraparenchymal hemorrhage, mass effect nor midline shift. Confluent supratentorial white matter hypodensities are within normal range for patient's age and though  non-specific suggest sequelae of chronic small vessel ischemic disease. No acute large vascular territory infarcts. Bilateral basal ganglia and thalamus lacunar infarcts.  No abnormal extra-axial fluid collections. Basal cisterns are patent. Severe calcific atherosclerosis of the carotid siphons and included vertebral arteries.  No skull fracture. The included ocular globes and orbital contents are non-suspicious. Sphenoid sinus air-fluid levels, paranasal sinus mucosal thickening. Mastoid air cells are well aerated.  IMPRESSION: No evidence of intracranial hemorrhage.  Involutional changes. Severe white matter changes suggest chronic small vessel ischemic disease with bilateral thalamus and basal ganglia lacunar infarcts.   Electronically Signed   By: Awilda Metro   On: 03/20/2014 03:10   US Renal Port  03/04/2014   CLINICAL DATA:  Elevated renal function tests  EXAM: RENAL/URINARY TRACT ULTRASOUND COMPLETE  COMPARISON:  None.  FINDINGS: Right Kidney:  Length: 10.7 cm. Echogenicity within normal limits. No mass or hydronephrosis visualized.  Left Kidney:  Length: 9.3 cm. Echogenicity within normal limits. No mass or hydronephrosis visualized.  Bladder:  Decompressed by Foley catheter.  IMPRESSION: Unremarkable renal ultrasound.  No hydronephrosis is noted.   Electronically Signed   By: Alcide Clever M.D.   On: 03/04/2014 16:29   Dg Chest Port 1 View  03/07/2014   CLINICAL DATA:  Acute hypoxia.  EXAM: PORTABLE CHEST - 1 VIEW  COMPARISON:  Chest radiograph performed 03/05/2014  FINDINGS: There is persistent left basilar airspace opacity, with mildly worsening bilateral mid lung zone airspace opacities, concerning for worsening multifocal pneumonia. A small left pleural effusion is suspected. No pneumothorax is seen.  The cardiomediastinal silhouette is mildly enlarged. A right IJ line is seen ending about mid SVC. Calcification is noted along the thoracic aorta. No acute osseous abnormalities are seen.   IMPRESSION: 1. Persistent left basilar airspace opacity, with mildly worsening bilateral mid lung zone airspace opacities, concerning for worsening multifocal pneumonia. Suspect small left pleural effusion. 2. Mild cardiomegaly.   Electronically Signed   By: Roanna Raider M.D.   On: 03/07/2014 22:51   Dg Chest Port 1 View  03/05/2014   CLINICAL DATA:  Congestive heart failure.  Cough.  EXAM: PORTABLE CHEST - 1 VIEW  COMPARISON:  03/03/2014  FINDINGS: Endotracheal tube and nasogastric tube a been removed. Right internal jugular central line has its tip in the SVC 2 cm above the right atrium. There is worsened airspace density in volume loss an both mid and lower lungs, more extensive on the left than the right. This could be due to atelectasis or pneumonia. Pleural fluid is present on the left.  IMPRESSION: Worsening airspace density in the mid and lower lungs bilaterally, left more than right. This could be atelectasis in or pneumonia. Endotracheal tube and nasogastric tube removed.   Electronically Signed   By: Paulina Fusi M.D.   On: 03/05/2014 10:13   Dg Chest Port 1 View  03/03/2014  CLINICAL DATA:  Followup of pneumonia.  EXAM: PORTABLE CHEST - 1 VIEW  COMPARISON:  03/02/2014  FINDINGS: Endotracheal tube 2.7 cm above carina. Nasogastric tube extends beyond the inferior aspect of the film. Right internal jugular line tip at low SVC.  Cardiomegaly accentuated by AP portable technique. Atherosclerosis in the transverse aorta. Mild to moderate left hemidiaphragm elevation. Small left pleural effusion. No pneumothorax. No congestive failure. Patchy left base airspace disease.  IMPRESSION: No significant change since the prior exam.  Small left pleural effusion with adjacent atelectasis or infection.   Electronically Signed   By: Jeronimo Greaves M.D.   On: 03/03/2014 07:39   Dg Chest Port 1 View  03/02/2014   CLINICAL DATA:  Respiratory failure  EXAM: PORTABLE CHEST - 1 VIEW  COMPARISON:  03/14/2014 and prior  chest radiographs  FINDINGS: Cardiomegaly again identified.  An endotracheal tube with tip 2.4 cm above the carina, right IJ central venous catheter with tip overlying mid SVC, and NG tube entering the stomach with tip off the field of view identified.  Near complete resolution of pulmonary edema identified.  Left lower lung consolidations/atelectasis and small left pleural effusion noted.  There is no evidence of pneumothorax.  IMPRESSION: Near complete resolution of pulmonary edema.  Left lower lung consolidations/atelectasis and small left pleural effusion.  Support apparatus as described.   Electronically Signed   By: Laveda Abbe M.D.   On: 03/02/2014 07:40   Dg Chest Port 1 View  03/09/2014   CLINICAL DATA:  Status post central line placement.  EXAM: PORTABLE CHEST - 1 VIEW  COMPARISON:  Chest x-ray from the same day at 04:37 a.m.  FINDINGS: Stable heart size and upper mediastinal contours. Unchanged diffuse pulmonary edema. No increasing pleural fluid. No appreciable pneumothorax.  New right IJ catheter, tip at the SVC. Unchanged and unremarkable positioning of endotracheal and orogastric tubes. Endotracheal tube ends between the clavicular heads and carina.  IMPRESSION: 1. New right IJ catheter, tip at the SVC.  No pneumothorax. 2. Unchanged pulmonary edema.   Electronically Signed   By: Tiburcio Pea M.D.   On: 03/22/2014 06:33   Dg Chest Port 1 View  03/11/2014   CLINICAL DATA:  Rule out pneumothorax.  History of hypertension.  EXAM: PORTABLE CHEST - 1 VIEW  COMPARISON:  Chest x-ray from the same day at 12:52 a.m.  FINDINGS: Unchanged borderline cardiomegaly. Stable aortic contours. The endotracheal tube ends between the clavicular heads and carina. The orogastric tube reaches the stomach. There is unchanged pulmonary edema. No evidence of effusion or pneumothorax.  IMPRESSION: 1. No evidence of pneumothorax. 2. Endotracheal and orogastric tubes remain in good position. 3. Unchanged pulmonary edema.    Electronically Signed   By: Tiburcio Pea M.D.   On: 03/15/2014 05:01   Dg Chest Port 1 View  03/06/2014   CLINICAL DATA:  Assess endotracheal tube.  Status post CPR.  EXAM: PORTABLE CHEST - 1 VIEW  COMPARISON:  Chest radiograph October 03, 2013  FINDINGS: Endotracheal tube tip projects 3.5 cm above the carina. Nasogastric tube side port projects in proximal stomach, distal tip not imaged. Cardiac silhouette appears mildly enlarged. Tortuous calcified aorta. Mediastinal silhouette is nonsuspicious. Increased lung volumes may reflect underlying COPD, unchanged. Diffuse interstitial prominence with patchy central alveolar airspace opacities. No pleural effusions. No pneumothorax.  Vascular calcifications projecting in the upper extremities. Osteopenia.  IMPRESSION: Endotracheal tube tip projects 3.5 cm above the carina, nasogastric tube past the proximal stomach.  Mild cardiomegaly,  interstitial and alveolar airspace opacities suggesting pulmonary edema, less likely ARDS.   Electronically Signed   By: Awilda Metro   On: 03/10/2014 01:06   Dg Abd Portable 1v  03/03/2014   CLINICAL DATA:  Check OG tube position  EXAM: PORTABLE ABDOMEN - 1 VIEW  COMPARISON:  None.  FINDINGS: OG tube tip is in the body or antrum of the stomach. Side port is in the body of the stomach.  Nonobstructive bowel gas pattern. No organomegaly or supine evidence of free air.  IMPRESSION: OG tube tip in the mid to distal stomach.   Electronically Signed   By: Charlett Nose M.D.   On: 03/03/2014 15:53   ASSESSMENT AND PLAN:  73 yo with history of COPD on home oxygen had PEA arrest yesterday, now intubated and s/p cooling  1. Cardiac arrest: PEA. Suspect primary respiratory arrest. Now rewarmed and off sedation. Head CT with nothing acute. She is awake and eyes are moving. EEG showed diffuse cerebral dysfunction that is nonspecific c/w hypoxic/ischemic injury. She appears very confused and slurring her words. For MRI yesterday but could  not complete due to respiratory distress 2. Pulmonary: Severe COPD, suspect respiratory infection/COPD exacerbation is driving this. She is on vanco/Zosyn. PCT elevated at 7.2. PCCM managing. Chest xray showed worsening airspace disease mid and lower lungs bilaterally, left more than right worrisome for PNA. WBC remains elevated. Continue Zosyn .  Now with worsening respiratory distress.  On exam she is not moving any air on the left.  Right side is clear with no crackles.  I do not think her respiratory distress is due to acute CHF despite elevated BNP.  Her CVP has been low for the past few days around 7 and this am it is 3.  Her renal function has worsened with diuresis. Chest xray last PM showed worsening pulmonary infiltrates consistent with PNA.  Will get a chest xray.  Will stop Amio for now in case this is acute amio toxicity. 3. Hypotension: resolved and now  hypertensive  - Continue IV NTG gtt and titrate to keep SBP < 150 and DBP <35mmHg  - Increase Hydralazine 50mg  TID  - would avoid titrating cardiazem any further due to reduced LVF  4. Tachycardia - now in NSR on PO Cardizem and Amio. No BB secondary to severe COPD.  Stop Amio due to lung issues. 5. Elevated troponin: Mild TnI elevation to 1.4, steady. Suspect this is most likely demand ischemia related to PEA arrest/hypotension. Has baseline LBBB. Echo showed EF 35-40% with wall motion abnormalities, this is worse than prior (EF 50%). Could be stunning related to arrest.  - Continue ASA, statin  - no BB secondary to severe COPD  - At this time would not pursue cath due to worsening renal function and worsening neuro status  6. Hyponatremia/Hypokalemia - replete  7. A/C renal failure, stage IV - creatinine better today  8. Anemia - received 1 unit PRBCs this am  9. Acute systolic CHF - she put out 1.4L yesterday and is 3.2L net neg. She is 5 lbs down from yesterday.  I do not think CHF is causing her acute respiratory decline.  She is not  moving any air on the left by exam and no crackles on present on exam.  Her CVP is very low at 3.  Her BNP is markedly elevated but clinically she does not appear volume overloaded.  I will get a chest xray but would hold off on further diuresis  at this time. Chest xray last night showed worsening PNA. Will stop amio for now in case this is acute amio toxicity.  Check a sed rate.    Quintella ReichertURNER,Michelle Wnek R, MD  03/08/2014  8:06 AM

## 2014-03-09 ENCOUNTER — Inpatient Hospital Stay (HOSPITAL_COMMUNITY): Payer: Medicare Other

## 2014-03-09 DIAGNOSIS — R410 Disorientation, unspecified: Secondary | ICD-10-CM

## 2014-03-09 DIAGNOSIS — F05 Delirium due to known physiological condition: Secondary | ICD-10-CM | POA: Diagnosis present

## 2014-03-09 DIAGNOSIS — I429 Cardiomyopathy, unspecified: Secondary | ICD-10-CM | POA: Diagnosis present

## 2014-03-09 DIAGNOSIS — G9340 Encephalopathy, unspecified: Secondary | ICD-10-CM | POA: Diagnosis present

## 2014-03-09 DIAGNOSIS — E876 Hypokalemia: Secondary | ICD-10-CM | POA: Diagnosis present

## 2014-03-09 DIAGNOSIS — E870 Hyperosmolality and hypernatremia: Secondary | ICD-10-CM | POA: Diagnosis present

## 2014-03-09 DIAGNOSIS — J9819 Other pulmonary collapse: Secondary | ICD-10-CM

## 2014-03-09 LAB — CBC WITH DIFFERENTIAL/PLATELET
Basophils Absolute: 0 10*3/uL (ref 0.0–0.1)
Basophils Relative: 0 % (ref 0–1)
EOS PCT: 0 % (ref 0–5)
Eosinophils Absolute: 0 10*3/uL (ref 0.0–0.7)
HCT: 29.1 % — ABNORMAL LOW (ref 36.0–46.0)
Hemoglobin: 9.5 g/dL — ABNORMAL LOW (ref 12.0–15.0)
Lymphocytes Relative: 6 % — ABNORMAL LOW (ref 12–46)
Lymphs Abs: 0.8 10*3/uL (ref 0.7–4.0)
MCH: 30.4 pg (ref 26.0–34.0)
MCHC: 32.6 g/dL (ref 30.0–36.0)
MCV: 93.3 fL (ref 78.0–100.0)
MONO ABS: 0.4 10*3/uL (ref 0.1–1.0)
MONOS PCT: 3 % (ref 3–12)
NEUTROS PCT: 91 % — AB (ref 43–77)
Neutro Abs: 12.4 10*3/uL — ABNORMAL HIGH (ref 1.7–7.7)
Platelets: 251 10*3/uL (ref 150–400)
RBC: 3.12 MIL/uL — AB (ref 3.87–5.11)
RDW: 14.8 % (ref 11.5–15.5)
WBC: 13.6 10*3/uL — ABNORMAL HIGH (ref 4.0–10.5)

## 2014-03-09 LAB — COMPREHENSIVE METABOLIC PANEL
ALT: 21 U/L (ref 0–35)
AST: 22 U/L (ref 0–37)
Albumin: 2.5 g/dL — ABNORMAL LOW (ref 3.5–5.2)
Alkaline Phosphatase: 68 U/L (ref 39–117)
Anion gap: 18 — ABNORMAL HIGH (ref 5–15)
BILIRUBIN TOTAL: 0.4 mg/dL (ref 0.3–1.2)
BUN: 69 mg/dL — AB (ref 6–23)
CALCIUM: 9 mg/dL (ref 8.4–10.5)
CO2: 25 mEq/L (ref 19–32)
Chloride: 108 mEq/L (ref 96–112)
Creatinine, Ser: 2.68 mg/dL — ABNORMAL HIGH (ref 0.50–1.10)
GFR calc non Af Amer: 17 mL/min — ABNORMAL LOW (ref >90)
GFR, EST AFRICAN AMERICAN: 19 mL/min — AB (ref >90)
GLUCOSE: 156 mg/dL — AB (ref 70–99)
Potassium: 3.2 mEq/L — ABNORMAL LOW (ref 3.7–5.3)
Sodium: 151 mEq/L — ABNORMAL HIGH (ref 137–147)
Total Protein: 6.2 g/dL (ref 6.0–8.3)

## 2014-03-09 LAB — MAGNESIUM: Magnesium: 2.3 mg/dL (ref 1.5–2.5)

## 2014-03-09 MED ORDER — POTASSIUM CHLORIDE CRYS ER 20 MEQ PO TBCR
40.0000 meq | EXTENDED_RELEASE_TABLET | Freq: Once | ORAL | Status: AC
  Administered 2014-03-09: 40 meq via ORAL
  Filled 2014-03-09: qty 2

## 2014-03-09 MED ORDER — NITROGLYCERIN 0.3 MG/HR TD PT24
0.3000 mg | MEDICATED_PATCH | Freq: Every day | TRANSDERMAL | Status: DC
  Administered 2014-03-09 – 2014-03-13 (×5): 0.3 mg via TRANSDERMAL
  Filled 2014-03-09 (×5): qty 1

## 2014-03-09 MED ORDER — POTASSIUM CL IN DEXTROSE 5% 20 MEQ/L IV SOLN
20.0000 meq | INTRAVENOUS | Status: DC
  Administered 2014-03-09 – 2014-03-10 (×3): 20 meq via INTRAVENOUS
  Filled 2014-03-09 (×4): qty 1000

## 2014-03-09 MED ORDER — ASPIRIN 81 MG PO CHEW
81.0000 mg | CHEWABLE_TABLET | Freq: Every day | ORAL | Status: DC
  Administered 2014-03-10 – 2014-03-15 (×6): 81 mg via ORAL
  Filled 2014-03-09 (×6): qty 1

## 2014-03-09 MED ORDER — HALOPERIDOL LACTATE 5 MG/ML IJ SOLN: 1.0000 mg | INTRAMUSCULAR | Status: DC | PRN

## 2014-03-09 MED ORDER — LEVALBUTEROL HCL 0.63 MG/3ML IN NEBU
0.6300 mg | INHALATION_SOLUTION | RESPIRATORY_TRACT | Status: DC | PRN
  Administered 2014-03-10 – 2014-03-12 (×2): 0.63 mg via RESPIRATORY_TRACT
  Filled 2014-03-09 (×2): qty 3

## 2014-03-09 MED ORDER — METOPROLOL TARTRATE 25 MG PO TABS
25.0000 mg | ORAL_TABLET | Freq: Two times a day (BID) | ORAL | Status: DC
  Administered 2014-03-09 – 2014-03-14 (×10): 25 mg via ORAL
  Filled 2014-03-09 (×13): qty 1

## 2014-03-09 MED ORDER — METOPROLOL TARTRATE 1 MG/ML IV SOLN: 2.5000 mg | INTRAVENOUS | Status: DC | PRN

## 2014-03-09 NOTE — Progress Notes (Signed)
SUBJECTIVE:  Appears confusted  OBJECTIVE:   Vitals:   Filed Vitals:   03/09/14 0400 03/09/14 0500 03/09/14 0600 03/09/14 0759  BP: 132/62 138/59 163/75   Pulse: 88 88 98   Temp:      TempSrc:      Resp: 17 14 29    Height:      Weight:  142 lb 3.2 oz (64.5 kg)    SpO2: 96% 99% 89% 94%   I&O's:   Intake/Output Summary (Last 24 hours) at 03/09/14 0815 Last data filed at 03/09/14 2956  Gross per 24 hour  Intake  844.5 ml  Output   1275 ml  Net -430.5 ml   TELEMETRY: Reviewed telemetry pt in NSR:     PHYSICAL EXAM General: Well developed, well nourished, in no acute distress Head: Eyes PERRLA, No xanthomas.   Normal cephalic and atramatic  Lungs:   Scattered rhonchi Heart:   HRRR S1 S2 Pulses are 2+ & equal. Abdomen: Bowel sounds are positive, abdomen soft and non-tender without masses  Extremities:   No clubbing, cyanosis or edema.  DP +1 Neuro: Alert but confused   LABS: Basic Metabolic Panel:  Recent Labs  21/30/86 0305 03/09/14 0500  NA 144 151*  K 4.3 3.2*  CL 102 108  CO2 24 25  GLUCOSE 123* 156*  BUN 56* 69*  CREATININE 2.57* 2.68*  CALCIUM 9.5 9.0  MG  --  2.3   Liver Function Tests:  Recent Labs  03/09/14 0500  AST 22  ALT 21  ALKPHOS 68  BILITOT 0.4  PROT 6.2  ALBUMIN 2.5*   No results found for this basename: LIPASE, AMYLASE,  in the last 72 hours CBC:  Recent Labs  03/08/14 1257 03/09/14 0500  WBC 16.5* 13.6*  NEUTROABS 13.2* 12.4*  HGB 10.4* 9.5*  HCT 31.7* 29.1*  MCV 91.9 93.3  PLT 269 251   Cardiac Enzymes:  Recent Labs  03/08/14 0032 03/08/14 0632 03/08/14 1232  TROPONINI 1.13* 1.19* 1.10*   BNP: No components found with this basename: POCBNP,  D-Dimer: No results found for this basename: DDIMER,  in the last 72 hours Hemoglobin A1C: No results found for this basename: HGBA1C,  in the last 72 hours Fasting Lipid Panel: No results found for this basename: CHOL, HDL, LDLCALC, TRIG, CHOLHDL, LDLDIRECT,  in the  last 72 hours Thyroid Function Tests: No results found for this basename: TSH, T4TOTAL, FREET3, T3FREE, THYROIDAB,  in the last 72 hours Anemia Panel: No results found for this basename: VITAMINB12, FOLATE, FERRITIN, TIBC, IRON, RETICCTPCT,  in the last 72 hours Coag Panel:   Lab Results  Component Value Date   INR 1.11 03-11-14   INR 1.27 03/11/2014   INR 0.96 10/04/2013    RADIOLOGY: Ct Head Wo Contrast  03/02/2014   CLINICAL DATA:  COPD. Cardiac arrest at home on 02/27/2014. Maximum down time 10-18 min.  EXAM: CT HEAD WITHOUT CONTRAST  TECHNIQUE: Contiguous axial images were obtained from the base of the skull through the vertex without intravenous contrast.  COMPARISON:  03/11/14  FINDINGS: There is mild central and cortical atrophy. Periventricular white matter changes are consistent with small vessel disease and appear stable. Chronic lacunar infarcts are again identified in the basal ganglia bilaterally. No calvarial fracture. There is atherosclerotic calcification of the internal carotid arteries.  IMPRESSION: 1. Atrophy and small vessel disease. 2. 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 3.  No evidence for acute intracranial abnormality.  Electronically Signed   By: Rosalie Gums M.D.   On: 03/02/2014 17:23   Ct Head Wo Contrast  03-21-2014   CLINICAL DATA:  Rule out bleed, post resuscitation.  EXAM: CT HEAD WITHOUT CONTRAST  TECHNIQUE: Contiguous axial images were obtained from the base of the skull through the vertex without intravenous contrast.  COMPARISON:  CT of the head report September 12, 1998 though images are not available for direct comparison.  FINDINGS: The ventricles and sulci are normal for age. No intraparenchymal hemorrhage, mass effect nor midline shift. Confluent supratentorial white matter hypodensities are within normal range for patient's age and though non-specific suggest sequelae of chronic small vessel ischemic disease. No  acute large vascular territory infarcts. Bilateral basal ganglia and thalamus lacunar infarcts.  No abnormal extra-axial fluid collections. Basal cisterns are patent. Severe calcific atherosclerosis of the carotid siphons and included vertebral arteries.  No skull fracture. The included ocular globes and orbital contents are non-suspicious. Sphenoid sinus air-fluid levels, paranasal sinus mucosal thickening. Mastoid air cells are well aerated.  IMPRESSION: No evidence of intracranial hemorrhage.  Involutional changes. Severe white matter changes suggest chronic small vessel ischemic disease with bilateral thalamus and basal ganglia lacunar infarcts.   Electronically Signed   By: Awilda Metro   On: 03-21-2014 03:10   US Renal Port  03/04/2014   CLINICAL DATA:  Elevated renal function tests  EXAM: RENAL/URINARY TRACT ULTRASOUND COMPLETE  COMPARISON:  None.  FINDINGS: Right Kidney:  Length: 10.7 cm. Echogenicity within normal limits. No mass or hydronephrosis visualized.  Left Kidney:  Length: 9.3 cm. Echogenicity within normal limits. No mass or hydronephrosis visualized.  Bladder:  Decompressed by Foley catheter.  IMPRESSION: Unremarkable renal ultrasound.  No hydronephrosis is noted.   Electronically Signed   By: Alcide Clever M.D.   On: 03/04/2014 16:29   Dg Chest Port 1 View  03/07/2014   CLINICAL DATA:  Acute hypoxia.  EXAM: PORTABLE CHEST - 1 VIEW  COMPARISON:  Chest radiograph performed 03/05/2014  FINDINGS: There is persistent left basilar airspace opacity, with mildly worsening bilateral mid lung zone airspace opacities, concerning for worsening multifocal pneumonia. A small left pleural effusion is suspected. No pneumothorax is seen.  The cardiomediastinal silhouette is mildly enlarged. A right IJ line is seen ending about mid SVC. Calcification is noted along the thoracic aorta. No acute osseous abnormalities are seen.  IMPRESSION: 1. Persistent left basilar airspace opacity, with mildly worsening  bilateral mid lung zone airspace opacities, concerning for worsening multifocal pneumonia. Suspect small left pleural effusion. 2. Mild cardiomegaly.   Electronically Signed   By: Roanna Raider M.D.   On: 03/07/2014 22:51   Dg Chest Port 1 View  03/05/2014   CLINICAL DATA:  Congestive heart failure.  Cough.  EXAM: PORTABLE CHEST - 1 VIEW  COMPARISON:  03/03/2014  FINDINGS: Endotracheal tube and nasogastric tube a been removed. Right internal jugular central line has its tip in the SVC 2 cm above the right atrium. There is worsened airspace density in volume loss an both mid and lower lungs, more extensive on the left than the right. This could be due to atelectasis or pneumonia. Pleural fluid is present on the left.  IMPRESSION: Worsening airspace density in the mid and lower lungs bilaterally, left more than right. This could be atelectasis in or pneumonia. Endotracheal tube and nasogastric tube removed.   Electronically Signed   By: Paulina Fusi M.D.   On: 03/05/2014 10:13   Dg Chest Port 1  View  03/03/2014   CLINICAL DATA:  Followup of pneumonia.  EXAM: PORTABLE CHEST - 1 VIEW  COMPARISON:  03/02/2014  FINDINGS: Endotracheal tube 2.7 cm above carina. Nasogastric tube extends beyond the inferior aspect of the film. Right internal jugular line tip at low SVC.  Cardiomegaly accentuated by AP portable technique. Atherosclerosis in the transverse aorta. Mild to moderate left hemidiaphragm elevation. Small left pleural effusion. No pneumothorax. No congestive failure. Patchy left base airspace disease.  IMPRESSION: No significant change since the prior exam.  Small left pleural effusion with adjacent atelectasis or infection.   Electronically Signed   By: Jeronimo Greaves M.D.   On: 03/03/2014 07:39   Dg Chest Port 1 View  03/02/2014   CLINICAL DATA:  Respiratory failure  EXAM: PORTABLE CHEST - 1 VIEW  COMPARISON:  Mar 06, 2014 and prior chest radiographs  FINDINGS: Cardiomegaly again identified.  An endotracheal  tube with tip 2.4 cm above the carina, right IJ central venous catheter with tip overlying mid SVC, and NG tube entering the stomach with tip off the field of view identified.  Near complete resolution of pulmonary edema identified.  Left lower lung consolidations/atelectasis and small left pleural effusion noted.  There is no evidence of pneumothorax.  IMPRESSION: Near complete resolution of pulmonary edema.  Left lower lung consolidations/atelectasis and small left pleural effusion.  Support apparatus as described.   Electronically Signed   By: Laveda Abbe M.D.   On: 03/02/2014 07:40   Dg Chest Port 1 View  2014-03-06   CLINICAL DATA:  Status post central line placement.  EXAM: PORTABLE CHEST - 1 VIEW  COMPARISON:  Chest x-ray from the same day at 04:37 a.m.  FINDINGS: Stable heart size and upper mediastinal contours. Unchanged diffuse pulmonary edema. No increasing pleural fluid. No appreciable pneumothorax.  New right IJ catheter, tip at the SVC. Unchanged and unremarkable positioning of endotracheal and orogastric tubes. Endotracheal tube ends between the clavicular heads and carina.  IMPRESSION: 1. New right IJ catheter, tip at the SVC.  No pneumothorax. 2. Unchanged pulmonary edema.   Electronically Signed   By: Tiburcio Pea M.D.   On: 03-06-14 06:33   Dg Chest Port 1 View  03/06/14   CLINICAL DATA:  Rule out pneumothorax.  History of hypertension.  EXAM: PORTABLE CHEST - 1 VIEW  COMPARISON:  Chest x-ray from the same day at 12:52 a.m.  FINDINGS: Unchanged borderline cardiomegaly. Stable aortic contours. The endotracheal tube ends between the clavicular heads and carina. The orogastric tube reaches the stomach. There is unchanged pulmonary edema. No evidence of effusion or pneumothorax.  IMPRESSION: 1. No evidence of pneumothorax. 2. Endotracheal and orogastric tubes remain in good position. 3. Unchanged pulmonary edema.   Electronically Signed   By: Tiburcio Pea M.D.   On: 2014-03-06 05:01   Dg  Chest Port 1 View  Mar 06, 2014   CLINICAL DATA:  Assess endotracheal tube.  Status post CPR.  EXAM: PORTABLE CHEST - 1 VIEW  COMPARISON:  Chest radiograph October 03, 2013  FINDINGS: Endotracheal tube tip projects 3.5 cm above the carina. Nasogastric tube side port projects in proximal stomach, distal tip not imaged. Cardiac silhouette appears mildly enlarged. Tortuous calcified aorta. Mediastinal silhouette is nonsuspicious. Increased lung volumes may reflect underlying COPD, unchanged. Diffuse interstitial prominence with patchy central alveolar airspace opacities. No pleural effusions. No pneumothorax.  Vascular calcifications projecting in the upper extremities. Osteopenia.  IMPRESSION: Endotracheal tube tip projects 3.5 cm above the carina, nasogastric tube past the  proximal stomach.  Mild cardiomegaly, interstitial and alveolar airspace opacities suggesting pulmonary edema, less likely ARDS.   Electronically Signed   By: Awilda Metroourtnay  Bloomer   On: 02/25/2014 01:06   Dg Abd Portable 1v  03/03/2014   CLINICAL DATA:  Check OG tube position  EXAM: PORTABLE ABDOMEN - 1 VIEW  COMPARISON:  None.  FINDINGS: OG tube tip is in the body or antrum of the stomach. Side port is in the body of the stomach.  Nonobstructive bowel gas pattern. No organomegaly or supine evidence of free air.  IMPRESSION: OG tube tip in the mid to distal stomach.   Electronically Signed   By: Charlett NoseKevin  Dover M.D.   On: 03/03/2014 15:53    ASSESSMENT AND PLAN:  73 yo with history of COPD on home oxygen had PEA arrest yesterday, now intubated and s/p cooling  1. Cardiac arrest: PEA. Suspect primary respiratory arrest. Now rewarmed and off sedation. Head CT with nothing acute. She is awake and eyes are moving. EEG showed diffuse cerebral dysfunction that is nonspecific c/w hypoxic/ischemic injury. She appears very confused and slurring her words. For MRI yesterday but could not complete due to respiratory distress  2. Pulmonary: Severe COPD, suspect  respiratory infection/COPD exacerbation is driving this. She is on vanco/Zosyn. PCT elevated at 7.2. PCCM managing. Chest xray showed worsening airspace disease mid and lower lungs bilaterally, left more than right worrisome for PNA. WBC remains elevated. Continue Zosyn . Now with worsening respiratory distress. On exam she is not moving any air on the left. Right side is clear with no crackles. I do not think her respiratory distress is due to acute CHF despite elevated BNP. Her CVP has been low for the past few days around 7 and this am it is 3. Her renal function has worsened with diuresis and sodium is trending upward.  Chest xray showed worsening pulmonary infiltrates consistent with PNA. ? Whether she is developing ARDS?   Sed rate is 112 and Amio stopped for now in case this is acute amio toxicity.  3. Hypotension: resolved and was hypertensive yesterday but now BP has normalized. - Continue IV NTG gtt and titrate to keep SBP < 150 and DBP <2490mmHg  - Continue Hydralazine 50mg  TID  - would avoid titrating cardiazem any further due to reduced LVF  4. Tachycardia - now in NSR on PO Cardizem.  Amio stopped due to possibility of Amio lung toxicity. No BB secondary to severe COPD.  5. Elevated troponin: Mild TnI elevation to 1.4, steady. Suspect this is most likely demand ischemia related to PEA arrest/hypotension. Has baseline LBBB. Echo showed EF 35-40% with wall motion abnormalities, this is worse than prior (EF 50%). Could be stunning related to arrest.  - Continue ASA, statin  - no BB secondary to severe COPD  - At this time would not pursue cath due to worsening renal function and worsening neuro status  6. Hypokalemia - replete  7. A/C renal failure, stage IV - creatinine worse today  8. Anemia - received 1 unit PRBCs yesterday 9. Acute systolic CHF - she put out 1.2L yesterday and is 3.6L net neg. She is 8lbs down from admission. I do not think CHF is causing her acute respiratory decline.  Co-Ox was 83 yesterday.  She is not moving any air on the left by exam and no crackles on present on exam. Her CVP is very low at 2. Her BNP is markedly elevated but clinically she does not appear volume  overloaded. Her renal function continues to worsen and she is now hypernatremic.  Her sed rate is 112 ? ARDS.  Would hold on any further diuresis at this time. 10.  Hypernatremia due to diuresis   Quintella Reichert, MD  03/09/2014  8:15 AM

## 2014-03-09 NOTE — Progress Notes (Signed)
PULMONARY  / CRITICAL CARE MEDICINE CONSULTATION   Name: Melanie Williamson MRN: [id number] DOB: 1940/09/16    ADMISSION DATE:  March 10, 2014 CONSULTATION DATE: 03/07/2014  REQUESTING CLINICIAN: Dr. Joseph Art PRIMARY SERVICE: TRH  CHIEF COMPLAINT:  SOB  BRIEF PATIENT DESCRIPTION: 55 F with COPD admitted 8/7 for PEA arrest presumed 2/2 respiratory failure s/p rewarming with worsening respiratory distress evening of 8/14. Unclear if 2/2 pulmonary edema or ARDS. PCCM asked to consult.  SIGNIFICANT EVENTS / STUDIES:  8/6 CT head >>> nad  8/7 Echo >> EF 35 to 40%, mod MR (new from 10/05/2013) 8/7 Doppler legs b/l >> no DVT  8/8 Rewarming started at 6:30 am, off pressors  8/10 CT head > atrophy and small vessel disease, chronic lacunar infarcts stable, no intracranial abnormality  8/10 EEG>>diffuse cerebral dysfunction that is non-specific in etiology and can be seen with hypoxic/ischemic injury, toxic/metabolic encephalopathies, or medication effect. Sharp waves seen over the posterior quadrant regions indicate possible epileptogenic potential from the occipital and right posterior temporal regions. There were no electrographic seizures seen in this study  8/11 renal ultrasound > no hydro 8/11 extubated, briefly required BIPAP for pulm edema 8/14 Trf to SDU ordered 8/14 PCCM reconsulted for worsening resp failure with hypoxemia. CXR showed LLL collapse with cutoff sign 8/15 Comfortable on NRB.Chest percussion and nebulized BDs ordered 8/16 oxygenation improved some. LLL collapse improved. Agitated delirium. Systemic steroids and PRN lorazepam discontinued  LINES / TUBES: R IJ CVL 8/7 >> 8/16  CULTURES: Resp 8/14 >> could not be obtained Blood 8/14 >>    ANTIBIOTICS: Levaquin 8/7-8/9 Zosyn 8/7 >> 8/16 Vanc 8/7  >> 8/16   SUBJECTIVE:  RASS +1. +F/C. Poorly oriented. No resp distress  VITAL SIGNS: Temp:  [97.5 F (36.4 C)-98.9 F (37.2 C)] 97.5 F (36.4 C) (08/16 0847) Pulse Rate:  [85-113]  112 (08/16 0900) Resp:  [14-30] 18 (08/16 0900) BP: (93-171)/(37-78) 144/70 mmHg (08/16 0900) SpO2:  [87 %-99 %] 97 % (08/16 0900) FiO2 (%):  [92 %-100 %] 97 % (08/15 1800) Weight:  [64.5 kg (142 lb 3.2 oz)] 64.5 kg (142 lb 3.2 oz) (08/16 0500) HEMODYNAMICS:   VENTILATOR SETTINGS: Vent Mode:  [-]  FiO2 (%):  [92 %-100 %] 97 % INTAKE / OUTPUT: Intake/Output     08/15 0701 - 08/16 0700 08/16 0701 - 08/17 0700   P.O. 120    I.V. (mL/kg) 547.5 (8.5)    IV Piggyback 200    Total Intake(mL/kg) 867.5 (13.4)    Urine (mL/kg/hr) 1275 (0.8)    Total Output 1275     Net -407.5            PHYSICAL EXAMINATION: General: Mildly agitated, NAD Neuro:  No focal deficits noted, diffusely weak HEENT:  WNL Neck: No JVD noted Cardiovascular:  Reg, no M Lungs: improved BS in LLL, bibasilar crackles, no wheezes Abdomen:  S/NT/ND/(+)BS Ext:  (-) C/C/E Skin:  Diffuse ecchymosis  LABS: I have reviewed all of today's lab results. Relevant abnormalities are discussed in the A/P section  CXR: Improved LLL aeration, diffuse IS prominence c/w edema vs ALI  ASSESSMENT / PLAN:  PULMONARY A: Acute Hypoxic Resp Failure  LLL collapse - improving Pulm edema vs ALI COPD without acute bronchospasm Suspected PNA - completed abx P:   Cont airway hygiene with chest percussion, BDs, PRN NTS Cont O2 to maintain SpO2 > 90% Cont to monitor in ICU  CARDIOVASCULAR A: Systolic Heart Failure, EF 35-40% Cardiac arrest, 8/07 AF> SR Mild  sinus tachycardia P:   Cards following Change NTG gtt to topical (patch) Cont hydralazine Begin low dose metoprolol DC diltiazem  RENAL A: AKI CKD Hypokalemia Hypernatremia P:   Monitor BMET intermittently Monitor I/Os Correct electrolytes as indicated D5W with KCl initiated 8/16  GASTROINTESTINAL A: Dysphagia P: Cont D1 diet with supervision  HEMATOLOGIC A: Anemia without overt bleeding P:   DVT px: SQ heparin Monitor CBC  intermittently Transfuse per usual ICU guidelines  INFECTIOUS A: HCAP - treated P:   Monitor temp, WBC count Micro and abx as above  ENDOCRINE A: Hyperglycemia without prior hx of DM P: Monitor glu on chem panels Consider SSI for glu > 180  NEUROLOGIC A: Post anoxic encephalopathy Agitated delirium - perhaps exacerbated by systemic steroids and BZDs P:   DC methylpred DC lorazepam Low dose PRN haloperidol Delirium prevention measures  TODAY'S SUMMARY:   I have personally obtained a history, examined the patient, evaluated laboratory and imaging results, formulated the assessment and plan and placed orders.  CRITICAL CARE: The patient is critically ill with multiple organ systems failure and requires high complexity decision making for assessment and support, frequent evaluation and titration of therapies, application of advanced monitoring technologies and extensive interpretation of multiple databases. Critical Care Time devoted to patient care services described in this note is 30 minutes.   Billy Fischeravid Simonds, MD ; [facility] service Mobile [phone].  After 5:30 PM or weekends, call [phone]   03/09/2014, 10:24 AM

## 2014-03-09 NOTE — Progress Notes (Signed)
eLink Physician-Brief Progress Note Patient Name: Melanie Williamson DOB: Apr 04, 1941 MRN: [id number]   Date of Service  03/09/2014  HPI/Events of Note  Hypokalemia  eICU Interventions  Potassium replaced     Intervention Category Intermediate Interventions: Electrolyte abnormality - evaluation and management  Juvon Teater 03/09/2014, 6:18 AM

## 2014-03-09 NOTE — Progress Notes (Signed)
Pharmacist Heart Failure Core Measure Documentation  Assessment: Melanie Williamson has an EF documented as 35-40% on 02/27/2014 by ECHO.  Rationale: Heart failure patients with left ventricular systolic dysfunction (LVSD) and an EF < 40% should be prescribed an angiotensin converting enzyme inhibitor (ACEI) or angiotensin receptor blocker (ARB) at discharge unless a contraindication is documented in the medical record.  This patient is not currently on an ACEI or ARB for HF.  This note is being placed in the record in order to provide documentation that a contraindication to the use of these agents is present for this encounter.  ACE Inhibitor or Angiotensin Receptor Blocker is contraindicated (specify all that apply)  []   ACEI allergy AND ARB allergy []   Angioedema []   Moderate or severe aortic stenosis []   Hyperkalemia []   Hypotension []   Renal artery stenosis [x]   Worsening renal function, preexisting renal disease or dysfunction   Tad Moore, BCPS  Clinical Pharmacist Pager [phone]  03/09/2014 2:37 PM

## 2014-03-10 ENCOUNTER — Inpatient Hospital Stay (HOSPITAL_COMMUNITY): Payer: Medicare Other

## 2014-03-10 LAB — CBC
HCT: 29.6 % — ABNORMAL LOW (ref 36.0–46.0)
Hemoglobin: 9.5 g/dL — ABNORMAL LOW (ref 12.0–15.0)
MCH: 30.4 pg (ref 26.0–34.0)
MCHC: 32.1 g/dL (ref 30.0–36.0)
MCV: 94.6 fL (ref 78.0–100.0)
Platelets: 316 10*3/uL (ref 150–400)
RBC: 3.13 MIL/uL — AB (ref 3.87–5.11)
RDW: 14.9 % (ref 11.5–15.5)
WBC: 23.7 10*3/uL — AB (ref 4.0–10.5)

## 2014-03-10 LAB — COMPREHENSIVE METABOLIC PANEL
ALT: 22 U/L (ref 0–35)
AST: 25 U/L (ref 0–37)
Albumin: 2.5 g/dL — ABNORMAL LOW (ref 3.5–5.2)
Alkaline Phosphatase: 66 U/L (ref 39–117)
Anion gap: 14 (ref 5–15)
BUN: 73 mg/dL — ABNORMAL HIGH (ref 6–23)
CO2: 23 meq/L (ref 19–32)
Calcium: 8.8 mg/dL (ref 8.4–10.5)
Chloride: 108 mEq/L (ref 96–112)
Creatinine, Ser: 2.24 mg/dL — ABNORMAL HIGH (ref 0.50–1.10)
GFR calc Af Amer: 24 mL/min — ABNORMAL LOW (ref >90)
GFR, EST NON AFRICAN AMERICAN: 21 mL/min — AB (ref >90)
Glucose, Bld: 139 mg/dL — ABNORMAL HIGH (ref 70–99)
Potassium: 3.8 mEq/L (ref 3.7–5.3)
SODIUM: 145 meq/L (ref 137–147)
TOTAL PROTEIN: 6 g/dL (ref 6.0–8.3)
Total Bilirubin: 0.3 mg/dL (ref 0.3–1.2)

## 2014-03-10 LAB — PROCALCITONIN: PROCALCITONIN: 0.26 ng/mL

## 2014-03-10 MED ORDER — DEXTROSE 5 % IV SOLN
1.0000 g | INTRAVENOUS | Status: DC
  Administered 2014-03-10 – 2014-03-15 (×6): 1 g via INTRAVENOUS
  Filled 2014-03-10 (×7): qty 1

## 2014-03-10 MED ORDER — VANCOMYCIN HCL 10 G IV SOLR
1250.0000 mg | INTRAVENOUS | Status: DC
  Administered 2014-03-10: 1250 mg via INTRAVENOUS
  Filled 2014-03-10 (×2): qty 1250

## 2014-03-10 NOTE — Progress Notes (Signed)
SLP Cancellation Note  Patient Details Name: [name] MRN: [id number] DOB: 29-Nov-1940   Cancelled treatment:       Reason Eval/Treat Not Completed: Medical issues which prohibited therapy. Pt remains on non-rebreather mask today. Will continue to follow as able. Please page if patient is able to be weaned from mask to participate in PO trials today.    Maxcine Ham, M.A. CCC-SLP [phone]  Maxcine Ham 03/10/2014, 2:57 PM

## 2014-03-10 NOTE — Progress Notes (Signed)
PT Cancellation Note  Patient Details Name: Melanie Williamson MRN: [id number] DOB: 11/09/1940   Cancelled Treatment:    Reason Eval/Treat Not Completed: Medical issues which prohibited therapy. Per RN, pt with new case of PNA and MD requesting therapies to hold for today. Will continue to follow and check back for appropriateness.    Ruthann Cancer 03/10/2014, 10:59 AM  Ruthann Cancer, PT, DPT Acute Rehabilitation Services Pager: [phone]

## 2014-03-10 NOTE — Progress Notes (Signed)
PULMONARY  / CRITICAL CARE MEDICINE CONSULTATION   Name: Melanie Williamson MRN: [id number] DOB: 1941-07-14    ADMISSION DATE:  03/14/2014 CONSULTATION DATE: 03/07/2014  REQUESTING CLINICIAN: Dr. Joseph Art PRIMARY SERVICE: TRH  CHIEF COMPLAINT:  SOB  BRIEF PATIENT DESCRIPTION: 64 F with COPD admitted 8/7 for PEA arrest presumed 2/2 respiratory failure s/p rewarming with worsening respiratory distress evening of 8/14. Unclear if 2/2 pulmonary edema or ARDS. PCCM asked to consult.  SIGNIFICANT EVENTS / STUDIES:  8/6 CT head >>> nad  8/7 Echo >> EF 35 to 40%, mod MR (new from 10/05/2013) 8/7 Doppler legs b/l >> no DVT  8/8 Rewarming started at 6:30 am, off pressors  8/10 CT head > atrophy and small vessel disease, chronic lacunar infarcts stable, no intracranial abnormality  8/10 EEG>>diffuse cerebral dysfunction that is non-specific in etiology and can be seen with hypoxic/ischemic injury, toxic/metabolic encephalopathies, or medication effect. Sharp waves seen over the posterior quadrant regions indicate possible epileptogenic potential from the occipital and right posterior temporal regions. There were no electrographic seizures seen in this study  8/11 renal ultrasound > no hydro 8/11 extubated, briefly required BIPAP for pulm edema 8/14 Trf to SDU ordered 8/14 PCCM reconsulted for worsening resp failure with hypoxemia. CXR showed LLL collapse with cutoff sign 8/15 Comfortable on NRB.Chest percussion and nebulized BDs ordered 8/16 oxygenation improved some. LLL collapse improved. Agitated delirium. Systemic steroids and PRN lorazepam discontinued  LINES / TUBES: R IJ CVL 8/7 >> 8/17 PICC 8/17>>  CULTURES: Resp 8/14 >> could not be obtained Blood 8/14 >>  Blood 8/17>>> Urine 8/17>>>  ANTIBIOTICS: Levaquin 8/7-8/9 Zosyn 8/7 >> 8/16 Vanc 8/7  >> 8/16>>>8/17>>> Cefepime 8/17>>>  SUBJECTIVE:  Deteriorating respiratory status overnight, requiring 100% NRB.  VITAL SIGNS: Temp:  [97.7 F  (36.5 C)-98.8 F (37.1 C)] 98.1 F (36.7 C) (08/17 0400) Pulse Rate:  [72-116] 100 (08/17 0800) Resp:  [14-29] 28 (08/17 0800) BP: (93-184)/(47-106) 121/106 mmHg (08/17 0800) SpO2:  [83 %-96 %] 94 % (08/17 0800) FiO2 (%):  [55 %] 55 % (08/17 0030) Weight:  [143 lb 15.4 oz (65.3 kg)] 143 lb 15.4 oz (65.3 kg) (08/17 0355)  HEMODYNAMICS:    VENTILATOR SETTINGS: Vent Mode:  [-]  FiO2 (%):  [55 %] 55 % INTAKE / OUTPUT: Intake/Output     08/16 0701 - 08/17 0700 08/17 0701 - 08/18 0700   P.O. 250    I.V. (mL/kg) 2366 (36.2) 100 (1.5)   IV Piggyback     Total Intake(mL/kg) 2616 (40.1) 100 (1.5)   Urine (mL/kg/hr) 901 (0.6) 125 (0.9)   Total Output 901 125   Net +1715 -25        Stool Occurrence 2 x     PHYSICAL EXAMINATION: General: woman of normal body habitus in NAD Neuro:  A&O x2 No focal deficits noted, diffusely weak HEENT:  Sores on tongue and around lips on NRB Neck: No JVD noted Cardiovascular: distant Reg Lungs:diminshed LLL, diffuse crackles, no wheezes Abdomen:  S/NT/ND/(+)BS Ext: no edema Skin:  Diffuse ecchymosis on legs and arms  LABS: I have reviewed all of today's lab results. Relevant abnormalities are discussed in the A/P section  CXR: Worsening LLL aeration, diffuse IS prominence c/w edema vs ALI  ASSESSMENT / PLAN:  PULMONARY A: Acute Hypoxic Resp Failure  LLL collapse  Pulm edema vs ALI- less likely Pulmonary edema due to -1.2 yesterday and 8lb loss since admission, 3.6L net negative COPD without acute bronchospasm Suspected PNA - completed abx  P:   Increased demand for O2. Cont airway hygiene with chest percussion, BDs, PRN NTS. Cont O2 to maintain SpO2 > 90%. Cont to monitor in ICU. PT refusing CPT. Spoke with patient extensively, will make DNR per patient's request.  CARDIOVASCULAR A: Systolic Heart Failure, EF 35-40% Cardiac arrest, 8/07 AF> SR Mild sinus tachycardia P:   Cards following. Change NTG gtt to topical (patch). Cont  hydralazine. Low dose metoprolol PO and IV PRN. DC Amnio- possible acute toxicity  RENAL A: AKI CKD Hypokalemia- resolved Hypernatremia- resolved P:   Monitor BMET intermittently Monitor I/Os Correct electrolytes as indicated D/C IVF.  GASTROINTESTINAL A: Dysphagia P: Cont D3 diet with nectar thick  HEMATOLOGIC A: Anemia without overt bleeding P:   DVT px: SQ heparin Monitor CBC intermittently Transfuse per usual ICU guidelines- last transfusion 8/15  INFECTIOUS A: HCAP - treated leukocytosis P:   Monitor temp, WBC count Cefepime and vanc restarted 8/17 Pan cultures 8/17  ENDOCRINE A: Hyperglycemia without prior hx of DM P: Monitor glu on chem panels Consider SSI for glu > 180 Methyl pred stopped  NEUROLOGIC A: Post anoxic encephalopathy Agitated delirium - perhaps exacerbated by systemic steroids and BZDs P:   DC methylpred DC lorazepam Low dose PRN haloperidol Delirium prevention measures Much more alert this AM  TODAY'S SUMMARY: Worsening respiratory status with more dependance on O2- on NRB since 8/15.  Hold off diureses for now, spoke with patient and daughter, after discussion decision was made to make patient LCB with no intubation/CPR/cardioversion, pressors ok for now.    I have personally obtained a history, examined the patient, evaluated laboratory and imaging results, formulated the assessment and plan and placed orders.  CRITICAL CARE: The patient is critically ill with multiple organ systems failure and requires high complexity decision making for assessment and support, frequent evaluation and titration of therapies, application of advanced monitoring technologies and extensive interpretation of multiple databases. Critical Care Time devoted to patient care services described in this note is 35 minutes.   Alyson ReedyWesam G. Yacoub, M.D. [facility]. Pager: [phone]. After hours pager: [phone].  03/10/2014, 9:15  AM

## 2014-03-10 NOTE — Progress Notes (Signed)
Melanie Williamson, OTR/L [phone]  

## 2014-03-10 NOTE — Progress Notes (Signed)
Pt refused the CPT through the vest all night. She would not let me put it on.

## 2014-03-10 NOTE — Progress Notes (Signed)
ANTIBIOTIC CONSULT NOTE - FOLLOW UP  Pharmacy Consult for vanc/cefepime Indication: HCAP  No Known Allergies  Patient Measurements: Height: 5\' 3"  (160 cm) Weight: 143 lb 15.4 oz (65.3 kg) IBW/kg (Calculated) : 52.4  Vital Signs: Temp: 98.1 F (36.7 C) (08/17 0400) Temp src: Oral (08/17 0400) BP: 135/51 mmHg (08/17 1100) Pulse Rate: 72 (08/17 1100) Intake/Output from previous day: 08/16 0701 - 08/17 0700 In: 2616 [P.O.:250; I.V.:2366] Out: 901 [Urine:901] Intake/Output from this shift: Total I/O In: 300 [I.V.:300] Out: 125 [Urine:125]  Labs:  Recent Labs  03/08/14 0305 03/08/14 1257 03/09/14 0500 03/10/14 0346  WBC  --  16.5* 13.6* 23.7*  HGB  --  10.4* 9.5* 9.5*  PLT  --  269 251 316  CREATININE 2.57*  --  2.68* 2.24*   Estimated Creatinine Clearance: 20.3 ml/min (by C-G formula based on Cr of 2.24). No results found for this basename: Rolm Gala, VANCORANDOM, GENTTROUGH, GENTPEAK, GENTRANDOM, TOBRATROUGH, TOBRAPEAK, TOBRARND, AMIKACINPEAK, AMIKACINTROU, AMIKACIN,  in the last 72 hours   Microbiology: Recent Results (from the past 720 hour(s))  URINE CULTURE     Status: None   Collection Time    Mar 28, 2014  1:16 AM      Result Value Ref Range Status   Specimen Description URINE, CLEAN CATCH   Final   Special Requests NONE   Final   Culture  Setup Time     Final   Value: 03/01/2014 00:23     Performed at [facility] Count     Final   Value: NO GROWTH     Performed at [facility]   Culture     Final   Value: NO GROWTH     Performed at [facility]   Report Status 03/02/2014 FINAL   Final  MRSA PCR SCREENING     Status: None   Collection Time    03/28/2014  3:29 AM      Result Value Ref Range Status   MRSA by PCR NEGATIVE  NEGATIVE Final   Comment:            The GeneXpert MRSA Assay (FDA     approved for NASAL specimens     only), is one component of a     comprehensive MRSA colonization     surveillance  program. It is not     intended to diagnose MRSA     infection nor to guide or     monitor treatment for     MRSA infections.  CULTURE, BLOOD (ROUTINE X 2)     Status: None   Collection Time    March 28, 2014  3:30 AM      Result Value Ref Range Status   Specimen Description BLOOD RIGHT ARM   Final   Special Requests BOTTLES DRAWN AEROBIC AND ANAEROBIC 5CC   Final   Culture  Setup Time     Final   Value: 28-Mar-2014 09:23     Performed at [facility]   Culture     Final   Value: NO GROWTH 5 DAYS     Performed at [facility]   Report Status 03/06/2014 FINAL   Final  CULTURE, BLOOD (ROUTINE X 2)     Status: None   Collection Time    03/28/14  3:40 AM      Result Value Ref Range Status   Specimen Description BLOOD RIGHT HAND   Final   Special Requests BOTTLES DRAWN AEROBIC AND  ANAEROBIC 5CC   Final   Culture  Setup Time     Final   Value: 03/23/2014 09:24     [facility]   Culture     Final   Value: NO GROWTH 5 DAYS     [facility]   Report Status 03/06/2014 FINAL   Final  CLOSTRIDIUM DIFFICILE BY PCR     Status: None   Collection Time    03/06/14  8:59 AM      Result Value Ref Range Status   C difficile by pcr NEGATIVE  NEGATIVE Final    Anti-infectives   Start     Dose/Rate Route Frequency Ordered Stop   03/09/14 1300  vancomycin (VANCOCIN) IVPB 1000 mg/200 mL premix  Status:  Discontinued     1,000 mg 200 mL/hr over 60 Minutes Intravenous Every 48 hours 03/07/14 1219 03/09/14 1023   03/07/14 1200  vancomycin (VANCOCIN) IVPB 1000 mg/200 mL premix     1,000 mg 200 mL/hr over 60 Minutes Intravenous  Once 03/07/14 1101 03/07/14 1253   03/04/14 1500  piperacillin-tazobactam (ZOSYN) IVPB 2.25 g  Status:  Discontinued     2.25 g 100 mL/hr over 30 Minutes Intravenous 4 times per day 03/04/14 1426 03/09/14 1023   03/02/14 1100  vancomycin (VANCOCIN) IVPB 750 mg/150 ml premix  Status:  Discontinued     750 mg 150 mL/hr  over 60 Minutes Intravenous Every 24 hours 03/02/14 0813 03/04/14 1126   03/01/2014 1200  piperacillin-tazobactam (ZOSYN) IVPB 3.375 g  Status:  Discontinued     3.375 g 12.5 mL/hr over 240 Minutes Intravenous 3 times per day 03/24/2014 0926 03/04/14 1426   02/22/2014 1100  vancomycin (VANCOCIN) IVPB 1000 mg/200 mL premix  Status:  Discontinued     1,000 mg 200 mL/hr over 60 Minutes Intravenous Every 24 hours 03/15/2014 0926 03/02/14 0813   03/11/2014 1000  levofloxacin (LEVAQUIN) IVPB 500 mg  Status:  Discontinued     500 mg 100 mL/hr over 60 Minutes Intravenous Every 48 hours 03/12/2014 0843 03/03/14 1235   03/03/2014 0800  levofloxacin (LEVAQUIN) IVPB 500 mg  Status:  Discontinued     500 mg 100 mL/hr over 60 Minutes Intravenous Every 24 hours 03/15/2014 0239 02/25/2014 0843   03/03/2014 0245  levofloxacin (LEVAQUIN) IVPB 500 mg  Status:  Discontinued     500 mg 100 mL/hr over 60 Minutes Intravenous Every 24 hours 02/24/2014 0239 03/08/2014 0239      Assessment: 73 yo female with leukocytosis and suspected PNA to restart antibiotics. 8/17 CXR- Stable right upper lobe opacity consistent with pneumonia. WBC= 23.7, afeb, SCr= 2.24 and CrCl ~ 20. Last dose of vancomycin was 1000mg  on 8/14  vanc 8/17>> Cefepime 8/17>>  Levaquin 8/7>>8/10 Vanc 8/7>>8/11 Zosyn 8/7>>8/16 Vanc 8/14>>8/16  8/17 blood x2 8/17 urine 8/7 UCx>>NEG 8/7 BCx2>>NEG   Goal of Therapy:  Vancomycin trough level 15-20 mcg/ml  Plan:  -Cefepime 1gm IV q24h -Vancomycin 1250mg  IV q48h -Will follow renal function, cultures and clinical progress  Harland Germanndrew Praveen Coia, Pharm D 03/10/2014 12:01 PM

## 2014-03-10 NOTE — Progress Notes (Signed)
SUBJECTIVE:  More oriented this am, had to be placed on non-rebreather the last night. Feels ok this morning, no CP or SOB.   OBJECTIVE:   Vitals:   Filed Vitals:   03/10/14 0647 03/10/14 0650 03/10/14 0700 03/10/14 0742  BP:   143/55   Pulse: 82 79 77   Temp:      TempSrc:      Resp: 22 19 20    Height:      Weight:      SpO2: 90% 92% 92% 95%   I&O's:    Intake/Output Summary (Last 24 hours) at 03/10/14 [id number] Last data filed at 03/10/14 0700  Gross per 24 hour  Intake   2593 ml  Output    901 ml  Net   1692 ml   TELEMETRY: Reviewed telemetry pt in NSR: PVCs  PHYSICAL EXAM General: Well developed, well nourished, in no acute distress Head: Eyes PERRLA, No xanthomas.   Normal cephalic and atramatic  Lungs:   Scattered rhonchi Heart:   HRRR S1 S2 Pulses are 2+ & equal. Abdomen: Bowel sounds are positive, abdomen soft and non-tender without masses  Extremities:   No clubbing, cyanosis or edema.  DP +1 Neuro: Alert but confused   LABS: Basic Metabolic Panel:  Recent Labs  96/10/5406/16/15 0500 03/10/14 0346  NA 151* 145  K 3.2* 3.8  CL 108 108  CO2 25 23  GLUCOSE 156* 139*  BUN 69* 73*  CREATININE 2.68* 2.24*  CALCIUM 9.0 8.8  MG 2.3  --    Liver Function Tests:  Recent Labs  03/09/14 0500 03/10/14 0346  AST 22 25  ALT 21 22  ALKPHOS 68 66  BILITOT 0.4 0.3  PROT 6.2 6.0  ALBUMIN 2.5* 2.5*   No results found for this basename: LIPASE, AMYLASE,  in the last 72 hours CBC:  Recent Labs  03/08/14 1257 03/09/14 0500 03/10/14 0346  WBC 16.5* 13.6* 23.7*  NEUTROABS 13.2* 12.4*  --   HGB 10.4* 9.5* 9.5*  HCT 31.7* 29.1* 29.6*  MCV 91.9 93.3 94.6  PLT 269 251 316   Cardiac Enzymes:  Recent Labs  03/08/14 0032 03/08/14 0632 03/08/14 1232  TROPONINI 1.13* 1.19* 1.10*   Coag Panel:   Lab Results  Component Value Date   INR 1.11 03/02/2014   INR 1.27 03/03/2014   INR 0.96 10/04/2013   RADIOLOGY: Ct Head Wo Contrast  03/02/2014   CLINICAL DATA:   COPD. Cardiac arrest at home on 02/27/2014. Maximum down time 10-18 min.  Marland Kitchen.  IMPRESSION: 1. Atrophy and small vessel disease. 2. 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 3.  No evidence for acute intracranial abnormality.     Ct Head Wo Contrast  03/22/2014   CLINICAL DATA:  Rule out bleed, post resuscitation.    IMPRESSION: No evidence of intracranial hemorrhage.  Involutional changes. Severe white matter changes suggest chronic small vessel ischemic disease with bilateral thalamus and basal ganglia lacunar infarcts.     Dg Chest Port 1 View  03/07/2014   CLINICAL DATA:  Acute hypoxia.  EXAM: PORTABLE CHEST - 1 VIEW  COMPARISON:  IMPRESSION: 1. Persistent left basilar airspace opacity, with mildly worsening bilateral mid lung zone airspace opacities, concerning for worsening multifocal pneumonia. Suspect small left pleural effusion. 2. Mild cardiomegaly.       ASSESSMENT AND PLAN:   73 yo with history of COPD on home oxygen had PEA arrest yesterday, now intubated and s/p cooling  1. Cardiac arrest: PEA. Suspect primary respiratory arrest. Now rewarmed and off sedation. Head CT with nothing acute. She is awake and eyes are moving. EEG showed diffuse cerebral dysfunction that is nonspecific c/w hypoxic/ischemic injury. She is AAO x 2 this am.  2. Pulmonary: Severe COPD, suspect respiratory infection/COPD exacerbation is driving this. She is on vanco/Zosyn.  WBC elevated 14 --23.000 today, sed rate 112, we will order a CXR. ? ARDS Chest xray on 8/14 showed worsening airspace disease mid and lower lungs bilaterally, left more than right worrisome for multilobar PNA.  I do not think her respiratory distress is due to acute CHF despite elevated BNP. Her CVP was low on Friday - 3. Amio stopped for now in case this is acute amio toxicity.   3. Hypotension: resolved, hypertensive afterwards  - Continue IV NTG gtt and titrate to keep SBP < 150 and DBP <47mmHg  -  Continue Hydralazine 50mg  TID  - would avoid titrating cardiazem any further due to reduced LVF   4. Tachycardia - now in NSR on PO Cardizem.  Amio stopped due to possibility of Amio lung toxicity. No BB secondary to severe COPD.   5. Elevated troponin: Mild TnI elevation to 1.4, steady. Suspect this is most likely demand ischemia related to PEA arrest/hypotension. Has baseline LBBB. Echo showed EF 35-40% with wall motion abnormalities, this is worse than prior (EF 50%). Could be stunning related to arrest.  - Continue ASA, statin  - no BB secondary to severe COPD  - At this time would not pursue cath due to worsening renal function and worsening neuro status   7. A/C renal failure, stage IV - creatinine improved today 2.68 --> 2.24  8. Anemia - received 1 unit PRBCs on 8/15, Hb stable  9. Acute systolic CHF - she put out 1.2L yesterday and is 3.6L net neg. She is 8lbs down from admission. I do not think CHF is causing her acute respiratory decline. Co-Ox was 83 yesterday.  She is not moving any air on the left by exam and no crackles on present on exam. Her CVP was very low at 3. Her BNP is markedly elevated but clinically she does not appear volume overloaded.   Lars Masson, MD  03/10/2014  8:28 AM

## 2014-03-11 ENCOUNTER — Inpatient Hospital Stay (HOSPITAL_COMMUNITY): Payer: Medicare Other

## 2014-03-11 DIAGNOSIS — J438 Other emphysema: Secondary | ICD-10-CM

## 2014-03-11 DIAGNOSIS — J40 Bronchitis, not specified as acute or chronic: Secondary | ICD-10-CM

## 2014-03-11 DIAGNOSIS — I428 Other cardiomyopathies: Secondary | ICD-10-CM

## 2014-03-11 LAB — BASIC METABOLIC PANEL
Anion gap: 13 (ref 5–15)
BUN: 64 mg/dL — ABNORMAL HIGH (ref 6–23)
CALCIUM: 8.5 mg/dL (ref 8.4–10.5)
CO2: 22 mEq/L (ref 19–32)
Chloride: 110 mEq/L (ref 96–112)
Creatinine, Ser: 1.89 mg/dL — ABNORMAL HIGH (ref 0.50–1.10)
GFR, EST AFRICAN AMERICAN: 29 mL/min — AB (ref >90)
GFR, EST NON AFRICAN AMERICAN: 25 mL/min — AB (ref >90)
GLUCOSE: 128 mg/dL — AB (ref 70–99)
Potassium: 4.3 mEq/L (ref 3.7–5.3)
Sodium: 145 mEq/L (ref 137–147)

## 2014-03-11 LAB — MAGNESIUM: MAGNESIUM: 2.3 mg/dL (ref 1.5–2.5)

## 2014-03-11 LAB — URINE CULTURE: Colony Count: 50000

## 2014-03-11 LAB — CBC
HCT: 28.3 % — ABNORMAL LOW (ref 36.0–46.0)
HEMOGLOBIN: 9.1 g/dL — AB (ref 12.0–15.0)
MCH: 30.5 pg (ref 26.0–34.0)
MCHC: 32.2 g/dL (ref 30.0–36.0)
MCV: 95 fL (ref 78.0–100.0)
Platelets: 293 10*3/uL (ref 150–400)
RBC: 2.98 MIL/uL — ABNORMAL LOW (ref 3.87–5.11)
RDW: 14.8 % (ref 11.5–15.5)
WBC: 17.6 10*3/uL — ABNORMAL HIGH (ref 4.0–10.5)

## 2014-03-11 LAB — PHOSPHORUS: Phosphorus: 5.2 mg/dL — ABNORMAL HIGH (ref 2.3–4.6)

## 2014-03-11 LAB — GLUCOSE, CAPILLARY
GLUCOSE-CAPILLARY: 127 mg/dL — AB (ref 70–99)
Glucose-Capillary: 109 mg/dL — ABNORMAL HIGH (ref 70–99)

## 2014-03-11 MED ORDER — FUROSEMIDE 10 MG/ML IJ SOLN
20.0000 mg | Freq: Four times a day (QID) | INTRAMUSCULAR | Status: AC
  Administered 2014-03-11 (×3): 20 mg via INTRAVENOUS
  Filled 2014-03-11 (×2): qty 2

## 2014-03-11 MED ORDER — POTASSIUM CHLORIDE CRYS ER 20 MEQ PO TBCR
40.0000 meq | EXTENDED_RELEASE_TABLET | Freq: Three times a day (TID) | ORAL | Status: AC
  Administered 2014-03-11 (×2): 40 meq via ORAL
  Filled 2014-03-11 (×2): qty 2

## 2014-03-11 NOTE — Progress Notes (Signed)
PULMONARY  / CRITICAL CARE MEDICINE CONSULTATION   Name: Melanie Williamson MRN: [id number] DOB: 07-Nov-1940    ADMISSION DATE:  03/21/2014 CONSULTATION DATE: 03/07/2014  REQUESTING CLINICIAN: Dr. Joseph Art PRIMARY SERVICE: TRH  CHIEF COMPLAINT:  SOB  BRIEF PATIENT DESCRIPTION: 65 F with COPD admitted 8/7 for PEA arrest presumed 2/2 respiratory failure s/p rewarming with worsening respiratory distress evening of 8/14. Unclear if 2/2 pulmonary edema or ARDS. PCCM asked to consult.  SIGNIFICANT EVENTS / STUDIES:  8/6 CT head >>> nad  8/7 Echo >> EF 35 to 40%, mod MR (new from 10/05/2013) 8/7 Doppler legs b/l >> no DVT  8/8 Rewarming started at 6:30 am, off pressors  8/10 CT head > atrophy and small vessel disease, chronic lacunar infarcts stable, no intracranial abnormality  8/10 EEG>>diffuse cerebral dysfunction that is non-specific in etiology and can be seen with hypoxic/ischemic injury, toxic/metabolic encephalopathies, or medication effect. Sharp waves seen over the posterior quadrant regions indicate possible epileptogenic potential from the occipital and right posterior temporal regions. There were no electrographic seizures seen in this study  8/11 renal ultrasound > no hydro 8/11 extubated, briefly required BIPAP for pulm edema 8/14 Trf to SDU ordered 8/14 PCCM reconsulted for worsening resp failure with hypoxemia. CXR showed LLL collapse with cutoff sign 8/15 Comfortable on NRB.Chest percussion and nebulized BDs ordered 8/16 oxygenation improved some. LLL collapse improved. Agitated delirium. Systemic steroids and PRN lorazepam discontinued 8/17 remains on 100% NRB, made LCB with no CPR/cardioversion/ETT.  Cefepime/vanc restarted. 8/18 improved oxygenation but remains on 100% with both flaps open.  LINES / TUBES: R IJ CVL 8/7 >> 8/17 PICC 8/17>>  CULTURES: Resp 8/14 >> could not be obtained Blood 8/14 >>  Blood 8/17>>> Urine 8/17>>>  ANTIBIOTICS: Levaquin 8/7-8/9 Zosyn 8/7 >>  8/16 Vanc 8/7  >> 8/16>>>8/17>>> Cefepime 8/17>>>  SUBJECTIVE:  Deteriorating respiratory status overnight, requiring 100% NRB.  VITAL SIGNS: Temp:  [97.8 F (36.6 C)-98.6 F (37 C)] 97.8 F (36.6 C) (08/18 0347) Pulse Rate:  [72-132] 87 (08/18 0100) Resp:  [15-28] 20 (08/18 0100) BP: (110-154)/(43-137) 112/55 mmHg (08/18 0100) SpO2:  [79 %-100 %] 100 % (08/18 0100) FiO2 (%):  [55 %] 55 % (08/17 1545)  HEMODYNAMICS:    VENTILATOR SETTINGS: Vent Mode:  [-]  FiO2 (%):  [55 %] 55 % INTAKE / OUTPUT: Intake/Output     08/17 0701 - 08/18 0700   I.V. (mL/kg) 470 (7.2)   IV Piggyback 300   Total Intake(mL/kg) 770 (11.8)   Urine (mL/kg/hr) 1050 (0.7)   Total Output 1050   Net -280        PHYSICAL EXAMINATION: General: woman of normal body habitus in NAD Neuro:  A&O x2 No focal deficits noted, diffusely weak HEENT:  Sores on tongue and around lips on NRB Neck: No JVD noted Cardiovascular: distant Reg Lungs:diminshed LLL, diffuse crackles, no wheezes Abdomen:  S/NT/ND/(+)BS Ext: no edema Skin:  Diffuse ecchymosis on legs and arms  LABS: I have reviewed all of today's lab results. Relevant abnormalities are discussed in the A/P section  CXR: Worsening LLL aeration, diffuse IS prominence c/w edema vs ALI  ASSESSMENT / PLAN:  PULMONARY A: Acute Hypoxic Resp Failure  LLL collapse  Pulm edema vs ALI- less likely Pulmonary edema due to -1.2 yesterday and 8lb loss since admission, 3.6L net negative COPD without acute bronchospasm Suspected PNA - completed abx P:   Improved demand for O2. Cont airway hygiene with chest percussion, BDs, PRN NTS. Cont O2 to  maintain SpO2 > 90%. Cont to monitor in ICU. PT refusing CPT. Continue brovana/pulmicort/xopenex.  CARDIOVASCULAR A: Systolic Heart Failure, EF 35-40% Cardiac arrest, 8/07 AF> SR Mild sinus tachycardia P:   Cards following. Changed NTG gtt to topical (patch), now off. Cont hydralazine. Low dose metoprolol  PO and IV PRN. DC Amnio- possible acute toxicity  RENAL A: AKI CKD Hypokalemia- resolved Hypernatremia- resolved P:   Monitor BMET intermittently Monitor I/Os Correct electrolytes as indicated D/Ced IVF. Begin low dose diureses 8/18.  GASTROINTESTINAL A: Dysphagia P: Cont D3 diet with nectar thick, speech following.  HEMATOLOGIC A: Anemia without overt bleeding P:   DVT px: SQ heparin Monitor CBC intermittently Transfuse per usual ICU guidelines- last transfusion 8/15  INFECTIOUS A: HCAP - treated leukocytosis P:   Monitor temp, WBC count Cefepime and vanc restarted 8/17 Pan cultures 8/17  ENDOCRINE A: Hyperglycemia without prior hx of DM P: Monitor glu on chem panels Consider SSI for glu > 180 Methyl pred stopped 8/16  NEUROLOGIC A: Post anoxic encephalopathy Agitated delirium - perhaps exacerbated by systemic steroids and BZDs P:   DC methylpred DC lorazepam Low dose PRN haloperidol Delirium prevention measures Much more alert this AM  TODAY'S SUMMARY: Respiratory status remains tenuous at best but oxygenation slightly improved, will hold in ICU given marginal respiratory status, will attempt to get down to 50% if able, low dose diureses and continue abx for now until cultures result.    I have personally obtained a history, examined the patient, evaluated laboratory and imaging results, formulated the assessment and plan and placed orders.  CRITICAL CARE: The patient is critically ill with multiple organ systems failure and requires high complexity decision making for assessment and support, frequent evaluation and titration of therapies, application of advanced monitoring technologies and extensive interpretation of multiple databases. Critical Care Time devoted to patient care services described in this note is 35 minutes.   Alyson ReedyWesam G. Ra Pfiester, M.D. [facility]. Pager: [phone]. After hours pager: [phone].  03/11/2014,  3:56 AM

## 2014-03-11 NOTE — Progress Notes (Signed)
[employer] medicare will not approve pt for a possible inpt rehab admission with current diagnosis. [phone]

## 2014-03-11 NOTE — Progress Notes (Signed)
PT Cancellation Note  Patient Details Name: Melanie Williamson MRN: [id number] DOB: Feb 11, 1941   Cancelled Treatment:    Reason Eval/Treat Not Completed: Medical issues which prohibited therapy. Per PA note PT/OT on hold due to increasing O2 needs. Will continue to follow and continue treatment as appropriate.    Ruthann Cancer 03/11/2014, 9:07 AM  Ruthann Cancer, PT, DPT Acute Rehabilitation Services Pager: [phone]

## 2014-03-11 NOTE — Progress Notes (Signed)
PULMONARY  / CRITICAL CARE MEDICINE CONSULTATION   Name: Melanie Williamson MRN: [id number] DOB: 08/28/1940    ADMISSION DATE:  03/22/2014 CONSULTATION DATE: 03/07/2014  REQUESTING CLINICIAN: Dr. Joseph ArtWoods PRIMARY SERVICE: TRH  CHIEF COMPLAINT:  SOB  BRIEF PATIENT DESCRIPTION: [age] F with COPD admitted 8/7 for PEA arrest presumed 2/2 respiratory failure s/p rewarming with worsening respiratory distress evening of 8/14. Unclear if 2/2 pulmonary edema or ARDS. PCCM asked to consult.  SIGNIFICANT EVENTS / STUDIES:  8/6 CT head >>> nad  8/7 Echo >> EF 35 to 40%, mod MR (new from 10/05/2013) 8/7 Doppler legs b/l >> no DVT  8/8 Rewarming started at 6:30 am, off pressors  8/10 CT head > atrophy and small vessel disease, chronic lacunar infarcts stable, no intracranial abnormality  8/10 EEG>>diffuse cerebral dysfunction that is non-specific in etiology and can be seen with hypoxic/ischemic injury, toxic/metabolic encephalopathies, or medication effect. Sharp waves seen over the posterior quadrant regions indicate possible epileptogenic potential from the occipital and right posterior temporal regions. There were no electrographic seizures seen in this study  8/11 renal ultrasound > no hydro 8/11 extubated, briefly required BIPAP for pulm edema 8/14 Trf to SDU ordered 8/14 PCCM reconsulted for worsening resp failure with hypoxemia. CXR showed LLL collapse with cutoff sign 8/15 Comfortable on NRB.Chest percussion and nebulized BDs ordered 8/16 oxygenation improved some. LLL collapse improved. Agitated delirium. Systemic steroids and PRN lorazepam discontinued 8/17 remains on 100% NRB, made LCB with no CPR/cardioversion/ETT.  Cefepime/vanc restarted. 8/18 Venti mask trial on 14L at 55% resulted in 87-77% O2 in 3 minutes  LINES / TUBES: R IJ CVL 8/7 >>  PICC 8/17>>  CULTURES: Resp 8/14 >> could not be obtained Blood 8/17>>> Urine 8/17>>>  ANTIBIOTICS: Levaquin 8/7-8/9 Zosyn 8/7 >> 8/16 Vanc 8/7  >>  8/16>>>8/17>>> Cefepime 8/17>>>  SUBJECTIVE:  Venti mask trial on 14L at 55% resulted in 87-77% O2 in 3 minutes.   VITAL SIGNS: Temp:  [97.8 F (36.6 C)-98.6 F (37 C)] 97.8 F (36.6 C) (08/18 0347) Pulse Rate:  [72-132] 92 (08/18 0600) Resp:  [15-28] 17 (08/18 0600) BP: (110-154)/(43-137) 135/63 mmHg (08/18 0600) SpO2:  [79 %-100 %] 99 % (08/18 0600) FiO2 (%):  [55 %] 55 % (08/18 0454) Weight:  [141 lb 8.6 oz (64.2 kg)] 141 lb 8.6 oz (64.2 kg) (08/18 0405)  HEMODYNAMICS:    VENTILATOR SETTINGS: Vent Mode:  [-]  FiO2 (%):  [55 %] 55 % INTAKE / OUTPUT: Intake/Output     08/17 0701 - 08/18 0700 08/18 0701 - 08/19 0700   P.O. 360    I.V. (mL/kg) 520 (8.1)    IV Piggyback 300    Total Intake(mL/kg) 1180 (18.4)    Urine (mL/kg/hr) 1400 (0.9)    Total Output 1400     Net -220           PHYSICAL EXAMINATION: General: woman of normal body habitus in NAD Neuro:  A&O x3 No focal deficits noted, diffusely weak HEENT:  Sores on tongue and around lips on NRB Neck: No JVD noted Cardiovascular: distant Reg Lungs:diminshed LLL, diffuse crackles, scattered wheezing Abdomen:  S/NT/ND/(+)BS Ext: no edema Skin:  Diffuse ecchymosis on legs and arms  LABS: I have reviewed all of today's lab results. Relevant abnormalities are discussed in the A/P section  CXR: Worsening RLL aeration, and stable LLL ALI  ASSESSMENT / PLAN:  PULMONARY A: Acute Hypoxic Resp Failure  LLL collapse  Pulm edema vs ALI- less likely Pulmonary edema  due to -1.2 yesterday and 8lb loss since admission, 3.6L net negative COPD without acute bronchospasm Suspected PNA - completed abx P:   Improved demand for O2. Cont airway hygiene with chest percussion, BDs, PRN NTS, patient continues to refuse. Cont O2 to maintain SpO2 > 90%. Cont to monitor in ICU. PT refusing CPT. Continue brovana/pulmicort/xopenex.  CARDIOVASCULAR A: Systolic Heart Failure, EF 35-40% Cardiac arrest, 8/07 AF> SR Mild sinus  tachycardia P:   Cards following. Changed NTG gtt to topical (patch), now off. Cont hydralazine. Low dose metoprolol PO and IV PRN. DC Amnio- possible acute toxicity  RENAL A: AKI CKD Hypokalemia- resolved Hypernatremia- resolved P:   Monitor BMET intermittently Monitor I/Os Correct electrolytes as indicated D/Ced IVF. Continue low dose diureses 8/19.  GASTROINTESTINAL A: Dysphagia P: Cont D3 diet with nectar thick, speech following. Swallow evaluation today.  HEMATOLOGIC A: Anemia without overt bleeding P:   DVT px: SQ heparin Monitor CBC intermittently Transfuse per usual ICU guidelines- last transfusion 8/15  INFECTIOUS A: HCAP - treated leukocytosis P:   Monitor temp, WBC count Cefepime and vanc restarted 8/17 Pan cultures 8/17   ENDOCRINE A: Hyperglycemia without prior hx of DM P: Monitor glu on chem panels Consider SSI for glu > 180 Methyl pred stopped 8/16  NEUROLOGIC A: Post anoxic encephalopathy Agitated delirium - perhaps exacerbated by systemic steroids and BZDs P:   DCed methylpred Xanax added D/C haloperidol.  MUSCULOSKELETAL A:  Weakness P: PT/OT- currently on hold with increasing O2 needs OOB to chair  TODAY'S SUMMARY: Respiratory status remains tenuous at best but oxygenation slightly improved, will transfer to SDU, to [place], PCCM will sign off, palliative care consult in place.  I have personally obtained a history, examined the patient, evaluated laboratory and imaging results, formulated the assessment and plan and placed orders.  Alyson Reedy, M.D. [facility] Acute Care Pulmonary/Critical Care Medicine. Pager: [phone]. After hours pager: [phone].  03/11/2014, 7:08 AM

## 2014-03-11 NOTE — Progress Notes (Signed)
Attempted to titrate O2 to Venti mask at 14L 55%. Pt desaturated from 87 to 77 within 3 minutes. RT will continue to work on O2 titration. Pt is back on NRB

## 2014-03-11 NOTE — Progress Notes (Addendum)
SUBJECTIVE:  The patient feels more SOB this am.  . antiseptic oral rinse  7 mL Mouth Rinse QID  . arformoterol  15 mcg Nebulization Q12H  . aspirin  81 mg Oral Daily  . atorvastatin  40 mg Oral q1800  . budesonide (PULMICORT) nebulizer solution  0.5 mg Nebulization BID  . ceFEPime (MAXIPIME) IV  1 g Intravenous Q24H  . chlorhexidine  15 mL Mouth Rinse BID  . furosemide  20 mg Intravenous Q6H  . heparin subcutaneous  5,000 Units Subcutaneous 3 times per day  . hydrALAZINE  50 mg Oral TID  . ipratropium-albuterol  3 mL Nebulization QID  . metoprolol tartrate  25 mg Oral BID  . nitroGLYCERIN  0.3 mg Transdermal Daily  . potassium chloride  40 mEq Oral TID  . vancomycin  1,250 mg Intravenous Q48H   OBJECTIVE:   Vitals:   Filed Vitals:   03/11/14 0900 03/11/14 1000 03/11/14 1100 03/11/14 1200  BP: 109/66 130/52 100/53 118/101  Pulse: 89 98 84 100  Temp:    98.8 F (37.1 C)  TempSrc:    Oral  Resp: 16 21 19    Height:      Weight:      SpO2: 100% 87% 95% 93%   I&O's:    Intake/Output Summary (Last 24 hours) at 03/11/14 1239 Last data filed at 03/11/14 1200  Gross per 24 hour  Intake    900 ml  Output   2300 ml  Net  -1400 ml   TELEMETRY: Reviewed telemetry pt in NSR: PVCs  PHYSICAL EXAM General: Well developed, well nourished, in no acute distress Head: Eyes PERRLA, No xanthomas.   Normal cephalic and atramatic  Lungs:   Scattered rhonchi Heart:   HRRR S1 S2 Pulses are 2+ & equal. Abdomen: Bowel sounds are positive, abdomen soft and non-tender without masses  Extremities:   No clubbing, cyanosis or edema.  DP +1 Neuro: Alert but confused   LABS: Basic Metabolic Panel:  Recent Labs  23/76/28 0500 03/10/14 0346 03/11/14 0418  NA 151* 145 145  K 3.2* 3.8 4.3  CL 108 108 110  CO2 25 23 22   GLUCOSE 156* 139* 128*  BUN 69* 73* 64*  CREATININE 2.68* 2.24* 1.89*  CALCIUM 9.0 8.8 8.5  MG 2.3  --  2.3  PHOS  --   --  5.2*   Liver Function Tests:  Recent  Labs  03/09/14 0500 03/10/14 0346  AST 22 25  ALT 21 22  ALKPHOS 68 66  BILITOT 0.4 0.3  PROT 6.2 6.0  ALBUMIN 2.5* 2.5*   No results found for this basename: LIPASE, AMYLASE,  in the last 72 hours CBC:  Recent Labs  03/08/14 1257 03/09/14 0500 03/10/14 0346 03/11/14 0418  WBC 16.5* 13.6* 23.7* 17.6*  NEUTROABS 13.2* 12.4*  --   --   HGB 10.4* 9.5* 9.5* 9.1*  HCT 31.7* 29.1* 29.6* 28.3*  MCV 91.9 93.3 94.6 95.0  PLT 269 251 316 293   Cardiac Enzymes: No results found for this basename: CKTOTAL, CKMB, CKMBINDEX, TROPONINI,  in the last 72 hours Coag Panel:   Lab Results  Component Value Date   INR 1.11 03/25/2014   INR 1.27 2014-03-25   INR 0.96 10/04/2013   RADIOLOGY: Ct Head Wo Contrast  03/02/2014   CLINICAL DATA:  COPD. Cardiac arrest at home on 02/27/2014. Maximum down time 10-18 min.  Marland Kitchen  IMPRESSION: 1. Atrophy and small vessel disease. 2. Chronic lacunar infarcts of  the basal ganglia bilaterally, stable in appearance. 3.  No evidence for acute intracranial abnormality.     Ct Head Wo Contrast  03/10/2014   CLINICAL DATA:  Rule out bleed, post resuscitation.    IMPRESSION: No evidence of intracranial hemorrhage.  Involutional changes. Severe white matter changes suggest chronic small vessel ischemic disease with bilateral thalamus and basal ganglia lacunar infarcts.     Dg Chest Port 1 View  03/07/2014   CLINICAL DATA:  Acute hypoxia.  EXAM: PORTABLE CHEST - 1 VIEW  COMPARISON:  IMPRESSION: 1. Persistent left basilar airspace opacity, with mildly worsening bilateral mid lung zone airspace opacities, concerning for worsening multifocal pneumonia. Suspect small left pleural effusion. 2. Mild cardiomegaly.       ASSESSMENT AND PLAN:   73 yo with history of COPD on home oxygen had PEA arrest yesterday, now intubated and s/p cooling   1. Cardiac arrest: PEA. Suspect primary respiratory arrest. Now rewarmed and off sedation. Head CT with nothing acute. She is awake and eyes  are moving. EEG showed diffuse cerebral dysfunction that is nonspecific c/w hypoxic/ischemic injury. She is AAO x 2 this am.  2. Pulmonary: Severe COPD, suspect respiratory infection/COPD exacerbation/ aspiration pneumonia is driving this. She is on vanco/Zosyn.  WBC elevated, sed rate 112, we will order a CXR. ? ARDS Chest xray on 8/14 showed worsening airspace disease mid and lower lungs bilaterally, left more than right worrisome for multilobar PNA.  I do not think her respiratory distress is due to acute CHF. Her CVP was low on Friday - 3. Amio stopped for now in case this is acute amio toxicity.   3. Hypertension - controlled on current regimen   4. Tachycardia - now in NSR on PO Cardizem and BB   5. Elevated troponin: Mild TnI elevation to 1.4, steady. Suspect this is most likely demand ischemia related to PEA arrest/hypotension. Has baseline LBBB. Echo showed EF 35-40% with wall motion abnormalities, this is worse than prior (EF 50%). Could be stunning related to arrest.  - Continue ASA, statin  - no BB secondary to severe COPD  - At this time would not pursue cath due to worsening renal function and altered neuro status and renal failure  7. A/C renal failure, post cardiac arrest - creatinine improving 2.68 --> 2.24 --> 1.89  8. Anemia - received 1 unit PRBCs on 8/15, Hb stable  9. Acute systolic CHF - she put out 1.4 L yesterday and is 5.0 L net neg. She is 10 lbs down from admission. Improving Crea. I do not think CHF is causing her acute respiratory decline. Co-Ox was 83 on 8/16, her CVP was very low at 3. She appears euvolemic, we will decrease Lasix to 20 mg IV BID.    Lars MassonNELSON, Elida Harbin H, MD  03/11/2014  12:39 PM

## 2014-03-11 NOTE — Progress Notes (Addendum)
Speech Language Pathology  Patient Details Name: SAIJA PLUNKET MRN: [id number] DOB: May 20, 1941 Today's Date: 03/11/2014 Time:  -      RN unable to place pt. on nasal cannula earlier this am.  SLP recommends MBS tomorrow and NPO until MBS complete in light of new pna and change in status. MD PLEASE ORDER IF AGREE    Breck Coons [place] [phone]

## 2014-03-11 NOTE — Progress Notes (Signed)
Speech Language Pathology Treatment: Dysphagia  Patient Details Name: KEYUANA SENFT MRN: [id number] DOB: Nov 06, 1940 Today's Date: 03/11/2014 Time: 3662-9476 SLP Time Calculation (min): 8 min  Assessment / Plan / Recommendation Clinical Impression  SLP following up for dysphagia intervention with daughter present.  Since this SLP saw pt. 8/14 pt. found to have pna and is now on a non rebreather mask.  Observed with nectar orange juice with min verbal cues for small sips without evidence of aspiration, however given decreased mental status 8/14 and new pna, recommend MBS to fully assess oropharyngeal swallow function and determine if she is silently aspirating.  RN will attempt nasal cannula to determine when MBS to be performed and continue po's.  RN to page this SLP.   HPI HPI: 73 year old female with PMH: HTN, hyperlipidenia admitted with respiratory disress and pt. found to be in PEA with CPR initiated.  CXR Left lower lung consolidations/atelectasis and small left pleural effusion. Found to have severe COPD.  INtubated 8/7-8/11.  ST ordered for ability to initiate po's.   Pertinent Vitals Pain Assessment: No/denies pain  SLP Plan  MBS    Recommendations Diet recommendations: Dysphagia 1 (puree);Nectar-thick liquid Liquids provided via: Cup;No straw Medication Administration: Whole meds with puree Supervision: Patient able to self feed;Staff to assist with self feeding;Full supervision/cueing for compensatory strategies Compensations: Slow rate;Small sips/bites Postural Changes and/or Swallow Maneuvers: Seated upright 90 degrees;Upright 30-60 min after meal              General recommendations: Rehab consult Oral Care Recommendations: Oral care BID Follow up Recommendations: Inpatient Rehab Plan: MBS    GO     [name] 03/11/2014, 11:20 AM [phone]

## 2014-03-11 NOTE — Progress Notes (Signed)
RT Note: Patient had been refusing her chest vest therapy all day. Patient was educated on the importance of the therapy and understands its importance. Patients daughter is at bedside and is also encouraging her mom to take the therapy. She did allow RT to perform the therapy once this afternoon and she tolerated it well with no issues or complications. Patient is stable and RT will continue to encourage her to allow [place] to do her chest vest with her. Rt will continue to monitor.

## 2014-03-11 NOTE — Progress Notes (Signed)
OT Cancellation Note  Patient Details Name: KYLIANNA GREENAWALT MRN: [id number] DOB: [birth year]   Cancelled Treatment:    Reason Eval/Treat Not Completed: Medical issues which prohibited therapy - increased 02 needs.  Angelene Giovanni [place], OTR/L [phone]  03/11/2014, 10:20 AM

## 2014-03-12 ENCOUNTER — Inpatient Hospital Stay (HOSPITAL_COMMUNITY): Payer: Medicare Other

## 2014-03-12 DIAGNOSIS — G9340 Encephalopathy, unspecified: Secondary | ICD-10-CM

## 2014-03-12 LAB — CBC
HCT: 30.4 % — ABNORMAL LOW (ref 36.0–46.0)
Hemoglobin: 9.9 g/dL — ABNORMAL LOW (ref 12.0–15.0)
MCH: 30.7 pg (ref 26.0–34.0)
MCHC: 32.6 g/dL (ref 30.0–36.0)
MCV: 94.1 fL (ref 78.0–100.0)
PLATELETS: 340 10*3/uL (ref 150–400)
RBC: 3.23 MIL/uL — ABNORMAL LOW (ref 3.87–5.11)
RDW: 14.6 % (ref 11.5–15.5)
WBC: 23 10*3/uL — AB (ref 4.0–10.5)

## 2014-03-12 LAB — BASIC METABOLIC PANEL
ANION GAP: 15 (ref 5–15)
BUN: 65 mg/dL — AB (ref 6–23)
CHLORIDE: 111 meq/L (ref 96–112)
CO2: 24 mEq/L (ref 19–32)
CREATININE: 1.87 mg/dL — AB (ref 0.50–1.10)
Calcium: 9.2 mg/dL (ref 8.4–10.5)
GFR calc non Af Amer: 26 mL/min — ABNORMAL LOW (ref >90)
GFR, EST AFRICAN AMERICAN: 30 mL/min — AB (ref >90)
Glucose, Bld: 124 mg/dL — ABNORMAL HIGH (ref 70–99)
Potassium: 5 mEq/L (ref 3.7–5.3)
Sodium: 150 mEq/L — ABNORMAL HIGH (ref 137–147)

## 2014-03-12 LAB — MAGNESIUM: Magnesium: 2.4 mg/dL (ref 1.5–2.5)

## 2014-03-12 LAB — PHOSPHORUS: Phosphorus: 4.2 mg/dL (ref 2.3–4.6)

## 2014-03-12 MED ORDER — FUROSEMIDE 10 MG/ML IJ SOLN
INTRAMUSCULAR | Status: AC
  Filled 2014-03-12: qty 4

## 2014-03-12 MED ORDER — FUROSEMIDE 10 MG/ML IJ SOLN
20.0000 mg | Freq: Four times a day (QID) | INTRAMUSCULAR | Status: AC
  Administered 2014-03-12 – 2014-03-13 (×3): 20 mg via INTRAVENOUS

## 2014-03-12 MED ORDER — VANCOMYCIN HCL IN DEXTROSE 750-5 MG/150ML-% IV SOLN
750.0000 mg | INTRAVENOUS | Status: DC
  Administered 2014-03-12 – 2014-03-13 (×2): 750 mg via INTRAVENOUS
  Filled 2014-03-12 (×2): qty 150

## 2014-03-12 MED ORDER — SODIUM CHLORIDE 0.9 % IJ SOLN
10.0000 mL | Freq: Two times a day (BID) | INTRAMUSCULAR | Status: DC
  Administered 2014-03-12: 20 mL
  Administered 2014-03-12 – 2014-03-14 (×4): 10 mL

## 2014-03-12 MED ORDER — ALPRAZOLAM 0.25 MG PO TABS
0.2500 mg | ORAL_TABLET | Freq: Three times a day (TID) | ORAL | Status: DC | PRN
  Administered 2014-03-12 – 2014-03-14 (×4): 0.25 mg via ORAL
  Filled 2014-03-12 (×4): qty 1

## 2014-03-12 MED ORDER — ISOSORBIDE MONONITRATE ER 30 MG PO TB24: 30.0000 mg | ORAL_TABLET | Freq: Every day | ORAL | Status: DC

## 2014-03-12 MED ORDER — DILTIAZEM HCL ER COATED BEADS 120 MG PO CP24
120.0000 mg | ORAL_CAPSULE | Freq: Every day | ORAL | Status: DC
  Administered 2014-03-12 – 2014-03-15 (×4): 120 mg via ORAL
  Filled 2014-03-12 (×4): qty 1

## 2014-03-12 MED ORDER — SODIUM CHLORIDE 0.9 % IJ SOLN: 10.0000 mL | INTRAMUSCULAR | Status: DC | PRN

## 2014-03-12 NOTE — Progress Notes (Signed)
E-link notified of patient's low O2 sats. Pt in maintaining around 85% on nonrebreather. Will continue to monitor.

## 2014-03-12 NOTE — Progress Notes (Signed)
Patient Melanie Williamson      DOB: [birth year]      JZP:[id number]  Patient awake, alert, comfortable but on NRB . Daughter Misty Stanley at the bedside.  Misty Stanley states that she and her cousin take care of Dal[place].  She would like him to be present. She is going to call him . We tentatively set time for 530 pm Thursday 8/20 pm. They will confirm.  Dontrelle Mazon L. Ladona Ridgel, MD MBA The Palliative Medicine Team at [facility] Phone: [phone] Pager: [phone] ( Use team phone after hours)

## 2014-03-12 NOTE — Progress Notes (Signed)
ANTIBIOTIC CONSULT NOTE - FOLLOW UP  Pharmacy Consult for vanc/cefepime Indication: HCAP  No Known Allergies  Patient Measurements: Height: 5\' 3"  (160 cm) Weight: 139 lb 15.9 oz (63.5 kg) IBW/kg (Calculated) : 52.4  Vital Signs: Temp: 98.9 F (37.2 C) (08/19 0725) Temp src: Oral (08/19 0725) BP: 165/94 mmHg (08/19 0800) Pulse Rate: 88 (08/19 0800) Intake/Output from previous day: 08/18 0701 - 08/19 0700 In: 470 [P.O.:180; I.V.:240; IV Piggyback:50] Out: 2525 [Urine:2525] Intake/Output from this shift: Total I/O In: 10 [I.V.:10] Out: -   Labs:  Recent Labs  03/10/14 0346 03/11/14 0418 03/12/14 0500  WBC 23.7* 17.6* 23.0*  HGB 9.5* 9.1* 9.9*  PLT 316 293 340  CREATININE 2.24* 1.89* 1.87*   Estimated Creatinine Clearance: 24 ml/min (by C-G formula based on Cr of 1.87). No results found for this basename: Rolm Gala, VANCORANDOM, GENTTROUGH, GENTPEAK, GENTRANDOM, TOBRATROUGH, TOBRAPEAK, TOBRARND, AMIKACINPEAK, AMIKACINTROU, AMIKACIN,  in the last 72 hours   Microbiology: Recent Results (from the past 720 hour(s))  URINE CULTURE     Status: None   Collection Time    02/25/2014  1:16 AM      Result Value Ref Range Status   Specimen Description URINE, CLEAN CATCH   Final   Special Requests NONE   Final   Culture  Setup Time     Final   Value: 03/01/2014 00:23     Performed at [facility] Count     Final   Value: NO GROWTH     Performed at [facility]   Culture     Final   Value: NO GROWTH     Performed at [facility]   Report Status 03/02/2014 FINAL   Final  MRSA PCR SCREENING     Status: None   Collection Time    03/07/2014  3:29 AM      Result Value Ref Range Status   MRSA by PCR NEGATIVE  NEGATIVE Final   Comment:            The GeneXpert MRSA Assay (FDA     approved for NASAL specimens     only), is one component of a     comprehensive MRSA colonization     surveillance program. It is not     intended to  diagnose MRSA     infection nor to guide or     monitor treatment for     MRSA infections.  CULTURE, BLOOD (ROUTINE X 2)     Status: None   Collection Time    03/22/2014  3:30 AM      Result Value Ref Range Status   Specimen Description BLOOD RIGHT ARM   Final   Special Requests BOTTLES DRAWN AEROBIC AND ANAEROBIC 5CC   Final   Culture  Setup Time     Final   Value: 03/20/2014 09:23     Performed at [facility]   Culture     Final   Value: NO GROWTH 5 DAYS     Performed at [facility]   Report Status 03/06/2014 FINAL   Final  CULTURE, BLOOD (ROUTINE X 2)     Status: None   Collection Time    03/11/2014  3:40 AM      Result Value Ref Range Status   Specimen Description BLOOD RIGHT HAND   Final   Special Requests BOTTLES DRAWN AEROBIC AND ANAEROBIC 5CC   Final   Culture  Setup Time  Final   Value: 03/06/14 09:24     Performed at [facility]   Culture     Final   Value: NO GROWTH 5 DAYS     Performed at [facility]   Report Status 03/06/2014 FINAL   Final  CLOSTRIDIUM DIFFICILE BY PCR     Status: None   Collection Time    03/06/14  8:59 AM      Result Value Ref Range Status   C difficile by pcr NEGATIVE  NEGATIVE Final  CULTURE, BLOOD (ROUTINE X 2)     Status: None   Collection Time    03/10/14 11:40 AM      Result Value Ref Range Status   Specimen Description BLOOD LEFT HAND   Final   Special Requests BOTTLES DRAWN AEROBIC ONLY 1CC   Final   Culture  Setup Time     Final   Value: 03/10/2014 17:07     Performed at [facility]   Culture     Final   Value:        BLOOD CULTURE RECEIVED NO GROWTH TO DATE CULTURE WILL BE HELD FOR 5 DAYS BEFORE ISSUING A FINAL NEGATIVE REPORT     Performed at [facility]   Report Status PENDING   Incomplete  CULTURE, BLOOD (ROUTINE X 2)     Status: None   Collection Time    03/10/14 11:45 AM      Result Value Ref Range Status   Specimen Description BLOOD RIGHT HAND   Final   Special  Requests BOTTLES DRAWN AEROBIC ONLY 1CC   Final   Culture  Setup Time     Final   Value: 03/10/2014 17:06     Performed at [facility]   Culture     Final   Value:        BLOOD CULTURE RECEIVED NO GROWTH TO DATE CULTURE WILL BE HELD FOR 5 DAYS BEFORE ISSUING A FINAL NEGATIVE REPORT     Performed at [facility]   Report Status PENDING   Incomplete  URINE CULTURE     Status: None   Collection Time    03/10/14  3:54 PM      Result Value Ref Range Status   Specimen Description URINE, RANDOM   Final   Special Requests NONE   Final   Culture  Setup Time     Final   Value: 03/10/2014 16:45     Performed at [facility] Count     Final   Value: 50,000 COLONIES/ML     Performed at [facility]   Culture     Final   Value: YEAST     Performed at [facility]   Report Status 03/11/2014 FINAL   Final    Anti-infectives   Start     Dose/Rate Route Frequency Ordered Stop   03/10/14 1300  ceFEPIme (MAXIPIME) 1 g in dextrose 5 % 50 mL IVPB     1 g 100 mL/hr over 30 Minutes Intravenous Every 24 hours 03/10/14 1204     03/10/14 1300  vancomycin (VANCOCIN) 1,250 mg in sodium chloride 0.9 % 250 mL IVPB     1,250 mg 166.7 mL/hr over 90 Minutes Intravenous Every 48 hours 03/10/14 1204     03/09/14 1300  vancomycin (VANCOCIN) IVPB 1000 mg/200 mL premix  Status:  Discontinued     1,000 mg 200 mL/hr over 60 Minutes Intravenous Every  48 hours 03/07/14 1219 03/09/14 1023   03/07/14 1200  vancomycin (VANCOCIN) IVPB 1000 mg/200 mL premix     1,000 mg 200 mL/hr over 60 Minutes Intravenous  Once 03/07/14 1101 03/07/14 1253   03/04/14 1500  piperacillin-tazobactam (ZOSYN) IVPB 2.25 g  Status:  Discontinued     2.25 g 100 mL/hr over 30 Minutes Intravenous 4 times per day 03/04/14 1426 03/09/14 1023   03/02/14 1100  vancomycin (VANCOCIN) IVPB 750 mg/150 ml premix  Status:  Discontinued     750 mg 150 mL/hr over 60 Minutes Intravenous Every 24 hours  03/02/14 0813 03/04/14 1126   02/23/2014 1200  piperacillin-tazobactam (ZOSYN) IVPB 3.375 g  Status:  Discontinued     3.375 g 12.5 mL/hr over 240 Minutes Intravenous 3 times per day 03/03/2014 0926 03/04/14 1426   03/07/2014 1100  vancomycin (VANCOCIN) IVPB 1000 mg/200 mL premix  Status:  Discontinued     1,000 mg 200 mL/hr over 60 Minutes Intravenous Every 24 hours 03/24/2014 0926 03/02/14 0813   03/20/2014 1000  levofloxacin (LEVAQUIN) IVPB 500 mg  Status:  Discontinued     500 mg 100 mL/hr over 60 Minutes Intravenous Every 48 hours 03/23/2014 0843 03/03/14 1235   03/23/2014 0800  levofloxacin (LEVAQUIN) IVPB 500 mg  Status:  Discontinued     500 mg 100 mL/hr over 60 Minutes Intravenous Every 24 hours 03/12/2014 0239 03/10/2014 0843   03/04/2014 0245  levofloxacin (LEVAQUIN) IVPB 500 mg  Status:  Discontinued     500 mg 100 mL/hr over 60 Minutes Intravenous Every 24 hours 03/12/2014 0239 03/06/2014 0239      Assessment: 73 yo female with leukocytosis and suspected PNA to restart antibiotics. 8/17 CXR- Stable right upper lobe opacity consistent with pneumonia. WBC= 23.7, afeb, SCr= 1.87 (trend down) and CrCl ~ 25 with improving UOP (1.7 ml/kg/hr)  vanc 8/17>> Cefepime 8/17>>  Levaquin 8/7>>8/10 Vanc 8/7>>8/11 Zosyn 8/7>>8/16 Vanc 8/14>>8/16  8/17 blood x2 8/17 urine- yeast 8/7 UCx>>NEG 8/7 BCx2>>NEG  Goal of Therapy:  Vancomycin trough level 15-20 mcg/ml  Plan:  -Continue cefepime 1gm IV q24h -Change Vancomycin to 750mg  IV q24hr -Consider d/c vancomycin if cultures remain NGTD -Will follow renal function, cultures and clinical progress  Harland Germanndrew Aleysia Oltmann, Pharm D 03/12/2014 8:39 AM

## 2014-03-12 NOTE — Progress Notes (Signed)
PT Cancellation Note and Discharge  Patient Details Name: Melanie Williamson MRN: [id number] DOB: 03-10-1941   Cancelled Treatment:    Reason Eval/Treat Not Completed: Patient not medically ready.   Pt continues to be on non-rebreather mask at 100%. Noted that this morning O2 sats dropping into the 80's at rest. Pt not medically ready for PT at this time and will sign off. Please re-consult if/when pt is appropriate for PT services.    Ruthann Cancer 03/12/2014, 8:51 AM  Ruthann Cancer, PT, DPT Acute Rehabilitation Services Pager: [phone]

## 2014-03-12 NOTE — Progress Notes (Signed)
Physical Therapy Treatment and Re-evaluation Patient Details Name: Melanie Williamson MRN: [id number] DOB: May 07, 1941 Today's Date: 03/12/2014    History of Present Illness Melanie Williamson is an 73 y.o. female with a past medical history significant for HTN, hyperlipidemia, COPD on nocturnal oxygen, admitted after suffering a witnessed cardiac arrest at home. She was intubated in field and ROSC was achieved with 10 mins of CPR and epi x 3. Maximum total downtime 10-18 minutes based on EMS.    PT Comments    Pt able to tolerate transfer bed>recliner today with little decrease in O2 sats. Pt very impulsive and is at high risk of falls, as +2 assist is required for pt to take steps. SPT with +2 assist is most appropriate for staff to transfer pt bed<>chair. Pt continues to demonstrate decreased strength, balance, and tolerance for functional activity, and feel that STR at the SNF level is most appropriate at this time.    Follow Up Recommendations  SNF;Supervision/Assistance - 24 hour     Equipment Recommendations  Rolling walker with 5" wheels    Recommendations for Other Services       Precautions / Restrictions Precautions Precautions: Fall Restrictions Weight Bearing Restrictions: No    Mobility  Bed Mobility Overal bed mobility: Needs Assistance Bed Mobility: Supine to Sit     Supine to sit: Min assist     General bed mobility comments: VC's for use of bed rails for assist. Therapist guiding trunk up into full sitting position, as well as assisting to scoot hips out to EOB.   Transfers Overall transfer level: Needs assistance Equipment used: 2 person hand held assist Transfers: Sit to/from [employer] Sit to Stand: Mod assist;+2 physical assistance;+2 safety/equipment Stand pivot transfers: Mod assist;+2 physical assistance;+2 safety/equipment       General transfer comment: Pt impulsive in initiating transfers, and was cued to stand and gain balance prior to  beginning transfer to chair. Once half-way standing, pt immediately trying to take steps to the recliner, requiring increased assist from therapist and tech to maintain balance and safety during transfer. Pt practiced sit<>stand once more from recliner, and was only able to maintain standing ~5 seconds before requiring seated rest break.   Ambulation/Gait                 Stairs            Wheelchair Mobility    Modified Rankin (Stroke Patients Only)       Balance Overall balance assessment: Needs assistance Sitting-balance support: Feet supported;Bilateral upper extremity supported Sitting balance-Leahy Scale: Poor Sitting balance - Comments: UE assist required for balance.    Standing balance support: Bilateral upper extremity supported Standing balance-Leahy Scale: Zero Standing balance comment: +2 assist required for standing balance                    Cognition Arousal/Alertness: Awake/alert Behavior During Therapy: WFL for tasks assessed/performed Overall Cognitive Status: Within Functional Limits for tasks assessed                      Exercises      General Comments        Pertinent Vitals/Pain Pain Assessment: No/denies pain    Home Living                      Prior Function            PT Goals (current goals  can now be found in the care plan section) Acute Rehab PT Goals Patient Stated Goal: To return home with her family. PT Goal Formulation: With patient Time For Goal Achievement: 03/26/14 Potential to Achieve Goals: Fair Progress towards PT goals: Progressing toward goals    Frequency  Min 2X/week    PT Plan Discharge plan needs to be updated;Frequency needs to be updated    Co-evaluation             End of Session Equipment Utilized During Treatment: Oxygen Activity Tolerance: Patient limited by fatigue Patient left: in chair;with call bell/phone within reach;with family/visitor present      Time: [id number] PT Time Calculation (min): 21 min  Charges:  $Therapeutic Activity: 8-22 mins                    G Codes:      Ruthann CancerHamilton, Alain Deschene 03/12/2014, 5:01 PM  Ruthann CancerLaura Hamilton, PT, DPT Acute Rehabilitation Services Pager: [phone]

## 2014-03-12 NOTE — Procedures (Signed)
Objective Swallowing Evaluation: Modified Barium Swallowing Study  Patient Details  Name: Melanie Williamson MRN: [id number] Date of Birth: 08/02/1940  Today's Date: 03/12/2014 Time: [id number] SLP Time Calculation (min): 32 min  Past Medical History:  Past Medical History  Diagnosis Date  . Hypertension   . Hyperlipidemia 07/17/2011  . COPD (chronic obstructive pulmonary disease)    Past Surgical History: History reviewed. No pertinent past surgical history. HPI:  73 year old female with PMH: HTN, hyperlipidenia admitted with respiratory disress and pt. found to be in PEA with CPR initiated.  CXR Left lower lung consolidations/atelectasis and small left pleural effusion. Found to have severe COPD.  Intubated 8/7-8/11.  ST ordered for ability to initiate po's with pt initiated on dys. 2 textures with nectar thick liquids, downgraded to Dys 1 with nectar thick liquids on 03/07/2014 secondary to increased confusion.  MBS ordered for today to objectively determine safest diet consistencies due to acute pneumonia and increased oxygen needs     Assessment / Plan / Recommendation Clinical Impression  Dysphagia Diagnosis: Moderate pharyngeal phase dysphagia;Moderate oral phase dysphagia Pt presents with a moderate multifactorial oropharyngeal dysphagia characterized by sensory and motor components (sensation more impaired than motor).  Pt was noted with premature spillage of all consistencies assessed secondary to decreased sensation and awareness of bolus with pharyngeal swallow consistently triggered at the level of the vallecula.  Pt was noted with adequate airway protection for nectar thick liquids, purees, and solid consistencies; however, delayed swallow initiation and decreased pharyngeal sensation resulted in silent aspiration of thin liquids during the swallow.  Additionally, pt was noted with significantly prolonged and inefficient mastication of solid consistencies resulting in increased oral transit  time and patient fatigue with oxygen desaturation into the mid 80s.  Upon esophageal sweep, pt was also noted with what appeared to be stasis at the middle segment of the esophagus and pt may benefit from an esophageal assessment should she experience symptoms of esophageal dysphagia.    Treatment Recommendation  Therapy as outlined in treatment plan below    Diet Recommendation Dysphagia 1 (Puree);Nectar-thick liquid   Liquid Administration via: Cup;No straw Medication Administration: Whole meds with puree Supervision: Patient able to self feed;Staff to assist with self feeding;Full supervision/cueing for compensatory strategies Compensations: Slow rate;Small sips/bites Postural Changes and/or Swallow Maneuvers: Seated upright 90 degrees;Upright 30-60 min after meal    Other  Recommendations Oral Care Recommendations: Oral care BID   Follow Up Recommendations  Inpatient Rehab    Frequency and Duration min 2x/week  2 weeks   Pertinent Vitals/Pain Desaturations noted to the mid 80s (84-85) when masticating solids.  O2 sats returned to 92 with rest.      SLP Swallow Goals  See care plan    General HPI: 73 year old female with PMH: HTN, hyperlipidenia admitted with respiratory disress and pt. found to be in PEA with CPR initiated.  CXR Left lower lung consolidations/atelectasis and small left pleural effusion. Found to have severe COPD.  INtubated 8/7-8/11.  ST ordered for ability to initiate po's. Type of Study: Modified Barium Swallowing Study Reason for Referral: Objectively evaluate swallowing function Diet Prior to this Study: NPO Temperature Spikes Noted: Yes Respiratory Status: non-rebreather Length of Intubations (days): 5 days Date extubated: 03/04/14 Behavior/Cognition: Alert;Cooperative;Pleasant mood;Requires cueing Oral Cavity - Dentition: Dentures, top;Dentures, bottom Oral Motor / Sensory Function: Impaired - see Bedside swallow eval Self-Feeding Abilities: Needs  assist;Needs set up Patient Positioning: Upright in chair Baseline Vocal Quality: Low vocal intensity  Volitional Cough: Strong Volitional Swallow: Able to elicit Anatomy: Within functional limits Pharyngeal Secretions: Not observed secondary MBS    Reason for Referral Objectively evaluate swallowing function   Oral Phase Oral Preparation/Oral Phase Oral Phase: Impaired Oral - Nectar Oral - Nectar Cup: Within functional limits Oral - Thin Oral - Thin Cup: Within functional limits Oral - Solids Oral - Puree: Within functional limits Oral - Regular: Impaired mastication;Weak lingual manipulation;Delayed oral transit   Pharyngeal Phase Pharyngeal Phase Pharyngeal Phase: Impaired Pharyngeal - Nectar Pharyngeal - Nectar Cup: Premature spillage to valleculae;Delayed swallow initiation Pharyngeal - Thin Pharyngeal - Thin Cup: Premature spillage to valleculae;Penetration/Aspiration during swallow;Delayed swallow initiation Penetration/Aspiration details (thin cup): Material enters airway, passes BELOW cords without attempt by patient to eject out (silent aspiration) Pharyngeal - Solids Pharyngeal - Puree: Premature spillage to valleculae;Delayed swallow initiation Pharyngeal - Regular: Delayed swallow initiation;Premature spillage to valleculae      GO       Melanie Williamson, Melanie Williamson 03/12/2014, 12:44 PM

## 2014-03-12 NOTE — Progress Notes (Signed)
Pt. Refuses chest vest at this time but requested to do flutter.

## 2014-03-12 NOTE — Progress Notes (Signed)
Peripherally Inserted Central Catheter/Midline Placement  The IV Nurse has discussed with the patient and/or persons authorized to consent for the patient, the purpose of this procedure and the potential benefits and risks involved with this procedure.  The benefits include less needle sticks, lab draws from the catheter and patient may be discharged home with the catheter.  Risks include, but not limited to, infection, bleeding, blood clot (thrombus formation), and puncture of an artery; nerve damage and irregular heat beat.  Alternatives to this procedure were also discussed.  PICC/Midline Placement Documentation  PICC / Midline Double Lumen 03/12/14 PICC Left Basilic 42 cm 2 cm (Active)  Indication for Insertion or Continuance of Line Poor Vasculature-patient has had multiple peripheral attempts or PIVs lasting less than 24 hours 03/12/2014  2:00 PM  Exposed Catheter (cm) 2 cm 03/12/2014  2:00 PM  Site Assessment Dry;Intact;Clean 03/12/2014  2:00 PM  Dressing Change Due 03/19/14 03/12/2014  2:00 PM       Stacie Glaze Horton 03/12/2014, 2:50 PM

## 2014-03-12 NOTE — Progress Notes (Addendum)
NUTRITION FOLLOW UP  DOCUMENTATION CODES Per approved criteria  -Severe malnutrition in the context of acute illness or injury   Intervention:    Continue Magic cup TID with meals, each supplement provides 290 kcal and 9 grams of protein  If supportive care desired, recommend enteral nutrition support initiation  RD to follow for nutrition care plan  Nutrition Dx:   Inadequate oral intake now related to respiratory status, dysphagia as evidenced by PO intake 0-35%, ongoing  Goal:   Pt to meet >/= 90% of their estimated nutrition needs, not met  Monitor:   PO & supplemental intake, TF initiation, weight, labs, I/O's  Assessment:   73 yo with COPD (on nocturnal oxygen) admitted 8/6 after suffering a witnessed cardiac arrest at home.   8/12:  Hypothermia protocol initiated 8/7. Pt extubated 8/11. Started on Dysphagia diet after SLP eval 8/12.   8/19:  Patient on non-breather mask.  Currently NPO.  Previously on a Dys 1-nectar thick liquid diet.  PO intake poor at 0-35% per flowsheet records since 8/12.  Noted patient's weight down 10 lbs x 1 week.  Palliative Care Team following.  Tentative family meeting Thursday, 8/20.  Patient meets criteria for severe malnutrition in the context of acute illness as evidenced by < 50% intake of estimated energy requirement for > 5 days and 6% weight loss x 1 week.  Height: Ht Readings from Last 1 Encounters:  03/19/2014 _0  (1.6 m)    Weight Status: -----> trending down  Wt Readings from Last 1 Encounters:  03/12/14 139 lb 15.9 oz (63.5 kg)    8/18  141 lb 8/17  143 lb 8/16  142 lb 8/14  147 lb 8/13  149 lb 8/12  151 lb 8/11  159 lb 8/09  155 lb 8/08  152 lb  Re-estimated needs:  Kcal: 1400-1600 Protein: 70-80 grams Fluid: > 1.5 L/day  Skin: Skin tears on both arms, wound on left lower leg  Diet Order: NPO   Intake/Output Summary (Last 24 hours) at 03/12/14 1210 Last data filed at 03/12/14 0800  Gross per 24 hour   Intake    310 ml  Output   1325 ml  Net  -1015 ml   Labs:   Recent Labs Lab 03/09/14 0500 03/10/14 0346 03/11/14 0418 03/12/14 0500  NA 151* 145 145 150*  K 3.2* 3.8 4.3 5.0  CL 108 108 110 111  CO2 _1 BUN 69* 73* 64* 65*  CREATININE 2.68* 2.24* 1.89* 1.87*  CALCIUM 9.0 8.8 8.5 9.2  MG 2.3  --  2.3 2.4  PHOS  --   --  5.2* 4.2  GLUCOSE 156* 139* 128* 124*    CBG (last 3)   Recent Labs  03/11/14 0737 03/11/14 1234  GLUCAP 109* 127*    Scheduled Meds: . antiseptic oral rinse  7 mL Mouth Rinse QID  . arformoterol  15 mcg Nebulization Q12H  . aspirin  81 mg Oral Daily  . atorvastatin  40 mg Oral q1800  . budesonide (PULMICORT) nebulizer solution  0.5 mg Nebulization BID  . ceFEPime (MAXIPIME) IV  1 g Intravenous Q24H  . chlorhexidine  15 mL Mouth Rinse BID  . diltiazem  120 mg Oral Daily  . furosemide  20 mg Intravenous Q6H  . heparin subcutaneous  5,000 Units Subcutaneous 3 times per day  . hydrALAZINE  50 mg Oral TID  . ipratropium-albuterol  3 mL Nebulization QID  . metoprolol tartrate  25 mg Oral BID  . nitroGLYCERIN  0.3 mg Transdermal Daily  . vancomycin  750 mg Intravenous Q24H    Continuous Infusions: . sodium chloride 10 mL/hr at 03/12/14 0800    Arthur Holms, RD, LDN Pager #: [phone] After-Hours Pager #: [phone]

## 2014-03-12 NOTE — Progress Notes (Signed)
OT Cancellation Note  Patient Details Name: Melanie Williamson MRN: [id number] DOB: August 06, 1940   Cancelled Treatment:    Reason Eval/Treat Not Completed: Medical issues which prohibited therapy. Pt continues to be on non-rebreather mask at 100%. Noted that this morning O2 sats dropping into the 80's at rest. Pt not medically ready for OT at this time and will sign off. Please re-consult if/when pt is appropriate for OT services.    Earlie Raveling OTR/L [phone] 03/12/2014, 9:37 AM

## 2014-03-12 NOTE — Progress Notes (Addendum)
SUBJECTIVE:  The patient feels better today, on non-re breather. Her daughter states that she appears oriented x 3. .  . antiseptic oral rinse  7 mL Mouth Rinse QID  . arformoterol  15 mcg Nebulization Q12H  . aspirin  81 mg Oral Daily  . atorvastatin  40 mg Oral q1800  . budesonide (PULMICORT) nebulizer solution  0.5 mg Nebulization BID  . ceFEPime (MAXIPIME) IV  1 g Intravenous Q24H  . chlorhexidine  15 mL Mouth Rinse BID  . furosemide  20 mg Intravenous Q6H  . heparin subcutaneous  5,000 Units Subcutaneous 3 times per day  . hydrALAZINE  50 mg Oral TID  . ipratropium-albuterol  3 mL Nebulization QID  . metoprolol tartrate  25 mg Oral BID  . nitroGLYCERIN  0.3 mg Transdermal Daily  . vancomycin  750 mg Intravenous Q24H   OBJECTIVE:   Vitals:   Filed Vitals:   03/12/14 0725 03/12/14 0800 03/12/14 0825 03/12/14 0900  BP: 168/77 165/94  147/123  Pulse:  88  107  Temp: 98.9 F (37.2 C)     TempSrc: Oral     Resp: 19 29  24   Height:      Weight:      SpO2: 87% 90% 90% 91%   I&O's:    Intake/Output Summary (Last 24 hours) at 03/12/14 1107 Last data filed at 03/12/14 0800  Gross per 24 hour  Intake    320 ml  Output   1325 ml  Net  -1005 ml   TELEMETRY: Reviewed telemetry pt in NSR: PVCs  PHYSICAL EXAM General: Well developed, well nourished, in no acute distress Head: Eyes PERRLA, No xanthomas.   Normal cephalic and atramatic  Lungs:   Scattered rhonchi Heart:   HRRR S1 S2 Pulses are 2+ & equal. Abdomen: Bowel sounds are positive, abdomen soft and non-tender without masses  Extremities:   No clubbing, cyanosis or edema.  DP +1 Neuro: Alert but confused   LABS: Basic Metabolic Panel:  Recent Labs  77/11/65 0418 03/12/14 0500  NA 145 150*  K 4.3 5.0  CL 110 111  CO2 22 24  GLUCOSE 128* 124*  BUN 64* 65*  CREATININE 1.89* 1.87*  CALCIUM 8.5 9.2  MG 2.3 2.4  PHOS 5.2* 4.2   Liver Function Tests:  Recent Labs  03/10/14 0346  AST 25  ALT 22    ALKPHOS 66  BILITOT 0.3  PROT 6.0  ALBUMIN 2.5*   No results found for this basename: LIPASE, AMYLASE,  in the last 72 hours CBC:  Recent Labs  03/11/14 0418 03/12/14 0500  WBC 17.6* 23.0*  HGB 9.1* 9.9*  HCT 28.3* 30.4*  MCV 95.0 94.1  PLT 293 340   Cardiac Enzymes: No results found for this basename: CKTOTAL, CKMB, CKMBINDEX, TROPONINI,  in the last 72 hours Coag Panel:   Lab Results  Component Value Date   INR 1.11 02/22/2014   INR 1.27 03/15/2014   INR 0.96 10/04/2013   RADIOLOGY: Ct Head Wo Contrast  03/02/2014   CLINICAL DATA:  COPD. Cardiac arrest at home on 02/27/2014. Maximum down time 10-18 min.  Marland Kitchen  IMPRESSION: 1. Atrophy and small vessel disease. 2. 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 3.  No evidence for acute intracranial abnormality.     Ct Head Wo Contrast  03/15/2014   CLINICAL DATA:  Rule out bleed, post resuscitation.    IMPRESSION: No evidence of intracranial hemorrhage.  Involutional changes.  Severe white matter changes suggest chronic small vessel ischemic disease with bilateral thalamus and basal ganglia lacunar infarcts.     Dg Chest Port 1 View  03/07/2014   CLINICAL DATA:  Acute hypoxia.  EXAM: PORTABLE CHEST - 1 VIEW  COMPARISON:  IMPRESSION: 1. Persistent left basilar airspace opacity, with mildly worsening bilateral mid lung zone airspace opacities, concerning for worsening multifocal pneumonia. Suspect small left pleural effusion. 2. Mild cardiomegaly.       ASSESSMENT AND PLAN:   73 yo with history of COPD on home oxygen had PEA arrest yesterday, now intubated and s/p cooling   1. Cardiac arrest: PEA. Suspect primary respiratory arrest. Now rewarmed and off sedation. Head CT with nothing acute. She is awake and eyes are moving. EEG showed diffuse cerebral dysfunction that is nonspecific c/w hypoxic/ischemic injury. She is AAO x 3 this am.  2. Pulmonary: Severe COPD, suspect respiratory infection vs  aspiration pneumonia vs ARDS with increasing oxygen demands.  Critical care is suggesting palliative care. The patient actually feels better.  On vanco/Zosyn. WBC elevated, sed rate 112. Amio stopped for stopped considering possible acute amio toxicity.   3. Hypertension - add cardizem CD 120 mg po daily.  4. Tachycardia - now in NSR on PO Cardizem and BB   5. Elevated troponin: Mild TnI elevation to 1.4, steady. Suspect this is most likely demand ischemia related to PEA arrest/hypotension. Has baseline LBBB. Echo showed EF 35-40% with wall motion abnormalities, this is worse than prior (EF 50%). Could be stunning related to arrest.  - Continue ASA, statin  - no BB secondary to severe COPD  - At this time would not pursue cath due to worsening renal function and altered neuro status and renal failure  7. A/C renal failure, post cardiac arrest - creatinine improving 2.68 --> 2.24 --> 1.89  8. Anemia - received 1 unit PRBCs on 8/15, Hb stable  9. Acute systolic CHF - she put out 1.0 L yesterday and is 6.0 L net neg. She is 10 lbs down from admission. Improving Crea. I do not think CHF is causing her acute respiratory decline. Co-Ox was 83 on 8/16, her CVP was very low at 3. She appears euvolemic, we will decrease Lasix to 20 mg IV BID.    Lars MassonNELSON, Loys Shugars H, MD  03/12/2014  11:07 AM

## 2014-03-13 DIAGNOSIS — Z9981 Dependence on supplemental oxygen: Secondary | ICD-10-CM

## 2014-03-13 DIAGNOSIS — I10 Essential (primary) hypertension: Secondary | ICD-10-CM

## 2014-03-13 DIAGNOSIS — R51 Headache: Secondary | ICD-10-CM

## 2014-03-13 DIAGNOSIS — Z515 Encounter for palliative care: Secondary | ICD-10-CM

## 2014-03-13 DIAGNOSIS — S27309A Unspecified injury of lung, unspecified, initial encounter: Secondary | ICD-10-CM

## 2014-03-13 DIAGNOSIS — E43 Unspecified severe protein-calorie malnutrition: Secondary | ICD-10-CM | POA: Diagnosis present

## 2014-03-13 DIAGNOSIS — Z5189 Encounter for other specified aftercare: Secondary | ICD-10-CM

## 2014-03-13 LAB — CBC
HCT: 29.1 % — ABNORMAL LOW (ref 36.0–46.0)
Hemoglobin: 9.6 g/dL — ABNORMAL LOW (ref 12.0–15.0)
MCH: 31.3 pg (ref 26.0–34.0)
MCHC: 33 g/dL (ref 30.0–36.0)
MCV: 94.8 fL (ref 78.0–100.0)
Platelets: 326 10*3/uL (ref 150–400)
RBC: 3.07 MIL/uL — AB (ref 3.87–5.11)
RDW: 14.8 % (ref 11.5–15.5)
WBC: 21 10*3/uL — AB (ref 4.0–10.5)

## 2014-03-13 LAB — BASIC METABOLIC PANEL
ANION GAP: 16 — AB (ref 5–15)
BUN: 84 mg/dL — ABNORMAL HIGH (ref 6–23)
CO2: 25 mEq/L (ref 19–32)
Calcium: 9 mg/dL (ref 8.4–10.5)
Chloride: 115 mEq/L — ABNORMAL HIGH (ref 96–112)
Creatinine, Ser: 2.28 mg/dL — ABNORMAL HIGH (ref 0.50–1.10)
GFR, EST AFRICAN AMERICAN: 23 mL/min — AB (ref >90)
GFR, EST NON AFRICAN AMERICAN: 20 mL/min — AB (ref >90)
GLUCOSE: 125 mg/dL — AB (ref 70–99)
POTASSIUM: 4.1 meq/L (ref 3.7–5.3)
SODIUM: 156 meq/L — AB (ref 137–147)

## 2014-03-13 LAB — MAGNESIUM: MAGNESIUM: 2.6 mg/dL — AB (ref 1.5–2.5)

## 2014-03-13 LAB — PHOSPHORUS: PHOSPHORUS: 4.5 mg/dL (ref 2.3–4.6)

## 2014-03-13 MED ORDER — HYDRALAZINE HCL 25 MG PO TABS
25.0000 mg | ORAL_TABLET | Freq: Three times a day (TID) | ORAL | Status: DC
  Administered 2014-03-14: 25 mg via ORAL
  Filled 2014-03-13 (×5): qty 1

## 2014-03-13 MED ORDER — SODIUM CHLORIDE 0.9 % IV BOLUS (SEPSIS)
500.0000 mL | Freq: Once | INTRAVENOUS | Status: AC
  Administered 2014-03-13: 500 mL via INTRAVENOUS

## 2014-03-13 MED ORDER — NYSTATIN 100000 UNIT/ML MT SUSP
5.0000 mL | Freq: Four times a day (QID) | OROMUCOSAL | Status: DC
  Administered 2014-03-13 – 2014-03-15 (×11): 500000 [IU] via ORAL
  Filled 2014-03-13 (×16): qty 5

## 2014-03-13 MED ORDER — VANCOMYCIN HCL IN DEXTROSE 750-5 MG/150ML-% IV SOLN
750.0000 mg | INTRAVENOUS | Status: DC
  Administered 2014-03-15: 750 mg via INTRAVENOUS
  Filled 2014-03-13: qty 150

## 2014-03-13 NOTE — Progress Notes (Addendum)
[facility]  Melanie Williamson ZOX:[id number] DOB: 08/31/1940 DOA: 03/01/2014 PCP: Warrick ParisianSTALLINGS,SHEILA, MD   Brief narrative: 73 yo WF PMHx with chronic respiratory failure with hypoxia, COPD (on nocturnal oxygen), acute systolic CHF, paroxysmal atrial fibrillation, chronic kidney disease stage III, admitted 8/6 after suffering a witnessed cardiac arrest at home. She was intubated in field and ROSC was achieved with 10 mins of CPR and epi x 3. Maximum total downtime 10-18 minutes based on EMS. Required intubation and hypothermic protocol.  After admit found with elevated TNI, new systolic HF with shock and AF requiring Amiodarone. It was also suspected she may have aspirated at time of arrest. After extubation she was confused but stable from a neurological standpoint but then began to slur her words and not using her right hand. Neurology was consulted and an MRI is pending. EEG done earlier while on the vent with sharp waves seen over the posterior quadrant regions indicate possible epileptogenic potential from the occipital and right posterior temporal regions. There were no electrographic seizures seen. She has subsequently developed more defined left basilar infiltrate with leukocytosis and is on broad spectrum anbx's  [facility] but due to progressive respiratory failure with associated LLL collapse she was sent back to the ICU on 8/15. Unclear if also ARDS or pulmonary edema.  SIGNIFICANT EVENTS:  8/8 Rewarming started at 6:30 am, off pressors  8/11 extubated, briefly required BIPAP for pulm edema  8/14 Transfered to SDU  8/15 PCCM reconsulted for worsening resp failure with hypoxemia. CXR showed LLL collapse with cutoff sign  8/15 Comfortable on NRB.Chest percussion and nebulized BDs ordered  8/16 oxygenation improved some. LLL collapse improved. Agitated delirium. Systemic steroids and PRN lorazepam discontinued  8/17 remains on 100% NRB, made  LCB with no CPR/cardioversion/ETT. Cefepime/vanc restarted.  8/18 Venti mask trial on 14L at 55% resulted in 87-77% O2 in 3 minutes   HPI/Subjective: Awake and much more alert than 1 week prior-still SOB but improved  Assessment/Plan: Active Problems:   Cardiac arrest/ PEA /NSTEMI  -TNI peaked 2.97 and cards feels due to demand ischemia -Cards suspects primary respiratory event -no cath at this time due to AMS and CKD with ARF -head CT non acute-EEG c/w nonspecific changes such as hypoxia/ischemic injury    Acute respiratory failure with hypoxia:   A) Acute systolic congestive heart failure, NYHA class 2 (EF 35-40%)   B) HCAP with LLL collapse   C) Acute lung injury   D) Severe COPD (compensated)-O2 dependent -heart failure management per Cardiology-has been aggressively diuresed since admission and now weight down by 13 lbs -cont empiric anbx's: Cefepime and Vanco -cont O2 with vibra vest (pt refuses) and flutter valve to improve pulmonary toilet -Continue supportive care with when necessary nebs  Hypernatremia -Na has risen in past 48 hrs from normal to 150 and today 156 -suspect have achieved adequate diuresis and now seems to be moving toward volume depletion -DC Lasix; dose Lasix prn based on weight gain     Acute renal failure on CKD, stage III -baseline renal function 22/1.46 -today 51/2.45 in setting of aggressive diuresis for HF and recent suspected flash pulmonary edema   Altered mental state/Dysarthria -resolving -based on CT seems due to encephalopathy post code/arrest -SLP rec D1 diet   Oral thrush -pt has clinical signs of thrush but also has ulcers on surface of tongue -begin Nystatin S/S -consider oral Acyclovir  Profound deconditioning -Palliaitive  working with pt and family -pt confirms desires not ETT/mech ventilation, CPR or defib -PT/OT rec SNF    HTN (hypertension) -BP controlled on metoprolol and hydralazine but RN called to inform me that BP  has been soft and night RN held hydralazine and metoprolol and today RN held hydralazine due to same -PO cardizem added 8/20 -discont Nitrodur patch and lower hydralazine    Atrial tachycardia/PAF -currently maintaining NSR -per Cards -Amiodarone was dc'd due to concerns of toxicity -Cardizem added 8/19 -cont BB   Anemia -follow hgb   DVT prophylaxis: Subcutaneous heparin Code Status: Full Family Communication: FAMILY at bedside Disposition Plan/Expected LOS: Stepdown   Consultants: Cardiology PCCM Neurology-signed off  Procedures: 8/7 Echo >> EF 35 to 40%, mod MR  8/7 Doppler legs b/l >> no DVT 8/10 EEG>>diffuse cerebral dysfunction that is non-specific in etiology and can be seen with hypoxic/ischemic injury, toxic/metabolic encephalopathies, or medication effect. Sharp waves seen over the posterior quadrant regions indicate possible epileptogenic potential from the occipital and right posterior temporal regions. There were no electrographic seizures seen in this study  8/11 renal ultrasound > no hydro    Cultures: 8/7 blood cultures negative 8/13 C. difficile PCR negative 8/7 MRSA PCR negative 8/7 urine culture negative 8/7 respiratory viral panel negative  Antibiotics: Levaquin 8/7-8/9  Zosyn 8/7 >> 8/16  Vanc 8/7 >> 8/16>>>8/17>>>  Cefepime 8/17>>>  CULTURES:  Resp 8/14 >> could not be obtained  Blood 8/17>>>  Urine 8/17>>>  Objective: Blood pressure 134/60, pulse 44, temperature 97.3 F (36.3 C), temperature source Oral, resp. rate 30, height 5\' 3"  (1.6 m), weight 137 lb 12.6 oz (62.5 kg), SpO2 100.00%.  Intake/Output Summary (Last 24 hours) at 03/13/14 1115 Last data filed at 03/13/14 1000  Gross per 24 hour  Intake    430 ml  Output   1425 ml  Net   -995 ml     Exam: Gen: No acute respiratory distress Neuro: Awake and alert, speech occ difficult to understand- only mildly confused- no focal deficits Chest: Coarse to auscultation bilaterally  with exp wheezes anteriorly, 100% NRB Cardiac: Regular rate and rhythm, S1-S2, no rubs murmurs or gallops, no peripheral edema, no JVD Abdomen: Soft nontender nondistended without obvious hepatosplenomegaly, no ascites Extremities: Symmetrical in appearance without cyanosis, clubbing or effusion   Scheduled Meds:  Scheduled Meds: . antiseptic oral rinse  7 mL Mouth Rinse QID  . arformoterol  15 mcg Nebulization Q12H  . aspirin  81 mg Oral Daily  . atorvastatin  40 mg Oral q1800  . budesonide (PULMICORT) nebulizer solution  0.5 mg Nebulization BID  . ceFEPime (MAXIPIME) IV  1 g Intravenous Q24H  . chlorhexidine  15 mL Mouth Rinse BID  . diltiazem  120 mg Oral Daily  . heparin subcutaneous  5,000 Units Subcutaneous 3 times per day  . hydrALAZINE  50 mg Oral TID  . ipratropium-albuterol  3 mL Nebulization QID  . metoprolol tartrate  25 mg Oral BID  . nitroGLYCERIN  0.3 mg Transdermal Daily  . nystatin  5 mL Oral QID  . sodium chloride  10-40 mL Intracatheter Q12H  . vancomycin  750 mg Intravenous Q24H   Continuous Infusions: . sodium chloride 10 mL/hr at 03/12/14 2000    Data Reviewed: Basic Metabolic Panel:  Recent Labs Lab 03/09/14 0500 03/10/14 0346 03/11/14 0418 03/12/14 0500 03/13/14 0500  NA 151* 145 145 150* 156*  K 3.2* 3.8 4.3 5.0 4.1  CL 108 108 110 111 115*  CO2 25 23 22 24 25   GLUCOSE 156* 139* 128* 124* 125*  BUN 69* 73* 64* 65* 84*  CREATININE 2.68* 2.24* 1.89* 1.87* 2.28*  CALCIUM 9.0 8.8 8.5 9.2 9.0  MG 2.3  --  2.3 2.4 2.6*  PHOS  --   --  5.2* 4.2 4.5   Liver Function Tests:  Recent Labs Lab 03/09/14 0500 03/10/14 0346  AST 22 25  ALT 21 22  ALKPHOS 68 66  BILITOT 0.4 0.3  PROT 6.2 6.0  ALBUMIN 2.5* 2.5*   No results found for this basename: LIPASE, AMYLASE,  in the last 168 hours No results found for this basename: AMMONIA,  in the last 168 hours CBC:  Recent Labs Lab 03/07/14 0250 03/08/14 1257 03/09/14 0500 03/10/14 0346  03/11/14 0418 03/12/14 0500 03/13/14 0500  WBC 14.0* 16.5* 13.6* 23.7* 17.6* 23.0* 21.0*  NEUTROABS 10.9* 13.2* 12.4*  --   --   --   --   HGB 10.2* 10.4* 9.5* 9.5* 9.1* 9.9* 9.6*  HCT 29.6* 31.7* 29.1* 29.6* 28.3* 30.4* 29.1*  MCV 89.2 91.9 93.3 94.6 95.0 94.1 94.8  PLT 242 269 251 316 293 340 326   Cardiac Enzymes:  Recent Labs Lab 03/08/14 0032 03/08/14 0632 03/08/14 1232  TROPONINI 1.13* 1.19* 1.10*   BNP (last 3 results)  Recent Labs  03/05/14 1058 03/08/14 0032  PROBNP 38198.0* 50443.0*   CBG:  Recent Labs Lab 03/11/14 0737 03/11/14 1234  GLUCAP 109* 127*    Recent Results (from the past 240 hour(s))  CLOSTRIDIUM DIFFICILE BY PCR     Status: None   Collection Time    03/06/14  8:59 AM      Result Value Ref Range Status   C difficile by pcr NEGATIVE  NEGATIVE Final  CULTURE, BLOOD (ROUTINE X 2)     Status: None   Collection Time    03/10/14 11:40 AM      Result Value Ref Range Status   Specimen Description BLOOD LEFT HAND   Final   Special Requests BOTTLES DRAWN AEROBIC ONLY 1CC   Final   Culture  Setup Time     Final   Value: 03/10/2014 17:07     Performed at [facility]   Culture     Final   Value:        BLOOD CULTURE RECEIVED NO GROWTH TO DATE CULTURE WILL BE HELD FOR 5 DAYS BEFORE ISSUING A FINAL NEGATIVE REPORT     Performed at [facility]   Report Status PENDING   Incomplete  CULTURE, BLOOD (ROUTINE X 2)     Status: None   Collection Time    03/10/14 11:45 AM      Result Value Ref Range Status   Specimen Description BLOOD RIGHT HAND   Final   Special Requests BOTTLES DRAWN AEROBIC ONLY 1CC   Final   Culture  Setup Time     Final   Value: 03/10/2014 17:06     Performed at [facility]   Culture     Final   Value:        BLOOD CULTURE RECEIVED NO GROWTH TO DATE CULTURE WILL BE HELD FOR 5 DAYS BEFORE ISSUING A FINAL NEGATIVE REPORT     Performed at [facility]   Report Status PENDING   Incomplete  URINE  CULTURE     Status: None   Collection Time    03/10/14  3:54 PM  Result Value Ref Range Status   Specimen Description URINE, RANDOM   Final   Special Requests NONE   Final   Culture  Setup Time     Final   Value: 03/10/2014 16:45     Performed at [facility] Count     Final   Value: 50,000 COLONIES/ML     Performed at [facility]   Culture     Final   Value: YEAST     Performed at [facility]   Report Status 03/11/2014 FINAL   Final     Studies:  Recent x-ray studies have been reviewed in detail by the Attending Physician  Time spent :      Junious Silk, ANP [facility] Office  [phone] Pager [phone]   **If unable to reach the above provider after paging please contact the Flow Manager @ [phone]  On-Call/Text Page:      [email or web address]      password [email or web address]  If 7PM-7AM, please contact night-coverage [email or web address] Password [email or web address] 03/13/2014, 11:15 AM   LOS: 13 days   Examined patient with ANP Revonda Standard, discussed assessment and plan and agree with the above plan.  Patient with multiple complex medical problems> 40 minutes spent on direct patient care

## 2014-03-13 NOTE — Evaluation (Signed)
Occupational Therapy Evaluation Patient Details Name: Melanie Lavora B Pischke MRN: [id number] DOB: 12/31/1940 Today's Date: 03/13/2014    History of Present Illness Melanie Williamson is an 73 y.o. female with a past medical history significant for HTN, hyperlipidemia, COPD on nocturnal oxygen, admitted after suffering a witnessed cardiac arrest at home. She was intubated in field and ROSC was achieved with 10 mins of CPR and epi x 3. Maximum total downtime 10-18 minutes based on EMS.  She was extubated 8/11 and developed worsening resp failure with hypoxemia.  Chest CXR showed LLL collapse.  Has been on 100% NRB   Clinical Impression   Pt admitted with above. She demonstrates the below listed deficits and will benefit from continued OT to maximize safety and independence with BADLs.  Pt presents to OT with deconditioning and impaired cognition.  She fatigues rapidly with minimal activity.   02 sats 86% on 100% NRB, but increased to 98% within 1 minute.  She requires max - total A with BADLs.  She will benefit from post acute rehab.  LTACH would be most appropriate.       Follow Up Recommendations  LTACH;Supervision/Assistance - 24 hour    Equipment Recommendations  3 in 1 bedside comode    Recommendations for Other Services       Precautions / Restrictions Precautions Precautions: Fall Restrictions Weight Bearing Restrictions: No      Mobility Bed Mobility Overal bed mobility: Needs Assistance Bed Mobility: Supine to Sit     Supine to sit: Min assist;HOB elevated     General bed mobility comments: Verbal cues and assist to lift shoulders from bed  Transfers Overall transfer level: Needs assistance Equipment used: Ambulation equipment used Antony Salmon(Stedy) Transfers: Sit to/from RaytheonStand;Stand Pivot Transfers Sit to Stand: Mod assist Stand pivot transfers: Mod assist (using Stedy)       General transfer comment: Pt is impulsive.  She "flops her body forward or to the side as she fatigues     Balance Overall balance assessment: Needs assistance Sitting-balance support: Feet supported Sitting balance-Leahy Scale: Poor Sitting balance - Comments: requires min A - min guard assist   Standing balance support: Bilateral upper extremity supported Standing balance-Leahy Scale: Zero                              ADL Overall ADL's : Needs assistance/impaired Eating/Feeding: Moderate assistance;Sitting;Bed level Eating/Feeding Details (indicate cue type and reason): Pt fatigues Grooming: Wash/dry hands;Wash/dry face;Set up;Sitting Grooming Details (indicate cue type and reason): max A to comb hair due to fatiugue.  Upper Body Bathing: Maximal assistance;Sitting;Bed level   Lower Body Bathing: Maximal assistance;Sit to/from stand   Upper Body Dressing : Maximal assistance;Sitting   Lower Body Dressing: Total assistance;Sit to/from stand   Toilet Transfer: Moderate assistance;Stand-pivot;BSC (Using [facility]) StatisticianToilet Transfer Details (indicate cue type and reason): mod A sit to stand.  Used WellPointStedy Financial traderlift equipment to Technical sales engineertransfer Toileting- Clothing Manipulation and Hygiene: Total assistance;Sit to/from stand       Functional mobility during ADLs: Moderate assistance (Stedy) General ADL Comments: Pt fatigues very quickly.  Very limited activity tolerance      Vision                     Perception     Praxis      Pertinent Vitals/Pain Pain Assessment: No/denies pain     Hand Dominance Right   Extremity/Trunk Assessment Upper Extremity Assessment Upper  Extremity Assessment: RUE deficits/detail;LUE deficits/detail RUE Deficits / Details: Demonstrates strength grossly 3+/5 bil. UEs LUE Deficits / Details: grossly 3+/5   Lower Extremity Assessment Lower Extremity Assessment: Defer to PT evaluation   Cervical / Trunk Assessment Cervical / Trunk Assessment: Other exceptions Cervical / Trunk Exceptions: flexed posture   Communication     Cognition  Arousal/Alertness: Awake/alert Behavior During Therapy: WFL for tasks assessed/performed Overall Cognitive Status: Impaired/Different from baseline Area of Impairment: Attention;Following commands;Safety/judgement;Awareness;Problem solving   Current Attention Level: Sustained   Following Commands: Follows one step commands consistently Safety/Judgement: Decreased awareness of deficits;Decreased awareness of safety Awareness: Intellectual Problem Solving: Slow processing;Difficulty sequencing;Requires verbal cues;Requires tactile cues General Comments: Pt is impulsive.  Demonstrates intermittent confusion.  She requires mod - max cues for safety awareness   General Comments       Exercises       Shoulder Instructions      Home Living Family/patient expects to be discharged to:: Private residence Living Arrangements: Children Available Help at Discharge: Family;Available 24 hours/day Type of Home: Apartment Home Access: Stairs to enter Entrance Stairs-Number of Steps: 10   Home Layout: One level     Bathroom Shower/Tub: Tub/shower unit Shower/tub characteristics: Engineer, building services: Standard     Home Equipment: Cane - single point          Prior Functioning/Environment Level of Independence: Independent             OT Diagnosis: Generalized weakness;Cognitive deficits   OT Problem List: Decreased strength;Decreased activity tolerance;Impaired balance (sitting and/or standing);Decreased cognition;Decreased safety awareness;Decreased knowledge of use of DME or AE;Cardiopulmonary status limiting activity   OT Treatment/Interventions: Self-care/ADL training;Therapeutic exercise;Energy conservation;DME and/or AE instruction;Therapeutic activities;Patient/family education;Balance training;Cognitive remediation/compensation    OT Goals(Current goals can be found in the care plan section) Acute Rehab OT Goals Patient Stated Goal: To get better OT Goal Formulation:  With patient Time For Goal Achievement: 03/27/14 Potential to Achieve Goals: Good ADL Goals Pt Will Perform Eating: with set-up;sitting Pt Will Perform Grooming: with set-up;sitting Pt Will Perform Upper Body Bathing: with min assist;sitting Pt Will Perform Lower Body Bathing: with mod assist;sit to/from stand Pt Will Perform Upper Body Dressing: with mod assist;sitting Pt Will Perform Lower Body Dressing: with mod assist;sit to/from stand Pt Will Transfer to Toilet: with min assist;ambulating;regular height toilet;bedside commode;grab bars Pt Will Perform Toileting - Clothing Manipulation and hygiene: with min assist;sit to/from stand  OT Frequency: Min 2X/week   Barriers to D/C: Decreased caregiver support  Unsure if family is able to provide necessary level of care at discharge       Co-evaluation              End of Session Equipment Utilized During Treatment: Other (comment);Oxygen (Stedy and NRB) Nurse Communication: Mobility status;Need for lift equipment  Activity Tolerance: Patient limited by fatigue Patient left: in chair;with call bell/phone within reach   Time: 8184-0375 OT Time Calculation (min): 26 min Charges:  OT General Charges $OT Visit: 1 Procedure OT Evaluation $Initial OT Evaluation Tier I: 1 Procedure OT Treatments $Therapeutic Activity: 8-22 mins G-Codes:    Preston Garabedian M 2014-03-18, 3:31 PM

## 2014-03-13 NOTE — Progress Notes (Signed)
Patient Melanie Williamson      DOB: August 08, 1940      JYN:[id number]  Confirmed goals of care meeting for this after noon at 530 pm with Dr. Greig Right and the patient's daughter and daughter's Nephew who are primary care givers.   Okechukwu Regnier L. Ladona Ridgel, MD MBA The Palliative Medicine Team at [facility] Phone: [phone] Pager: [phone] ( Use team phone after hours)

## 2014-03-13 NOTE — Consult Note (Addendum)
Patient ZO:XWRU B Shroff      DOB: 28-Nov-1940      EAV:[id number]     Consult Note from the Palliative Medicine Team at [facility]    Consult Requested by: Dr Molli Knock     PCP: Warrick Parisian, MD Reason for Consultation: Symptom Management    Phone Number:[phone]  Assessment/Recommendations: 73 yo female who presented with PEA arrest s/p TH protocol. Prolonged hypoxia in setting of COPD.   1.  Code Status: parital (okay for vasopressors and antiarrythmic drugs)  2. Goals of Care: Lucila Maine is actually Kathlene November (not nephew).  Pt has 2 daughters Linda/Rhonda that stay with her intermittently.  Jeselle and Kathlene November state that they are irregular in being by and often come by when they need help themselves (did not elaborate on this).  Mackenzey confirms that she would want Kathlene November to be [facility] which he agrees to do.  They are in the process of getting paperwork notarized.  Discussed ongoing issues with kindeys, hypoxia, possible underlying ischemic heart disease.  Dajai would like to continue efforts at recovery and both she and Kathlene November are very pleased with progress over past few days.  She is aware that if she continues to improve, will likely need rehab stay which she is okay with.  Concerning issue remains her hypoxia.  Now sating 100% on NRB and hopefully can wean soon.  Aware she could have further setbacks, but she appears to be doing remarkably well considering her PEA arrest and concern for anoxic injury.   3. Symptom Management:   1. Anxiety/Agitation/Delirium: Appears to be improving. Has low dose xanax PRN which she seems to be tolerating (watch for worsening delirium).  Has PRN haldol which we will need to be cautious with given her last EKG showing prolonged QTc of 528 on 8/15.   2. Headache- Tylenol PRN  4. Psychosocial/Spiritual: Lives at home with her daughter Bjorn Loser.  Also states that her nephew Kathlene November lives with her. Also daughter Misty Stanley reportedly cares for her. Will try to sort out when family  arrives.   Will continue to follow along with you at least on intermittent basis. Please contact our team if more urgent issues arise.  Brief HPI: 73 yo female with PMHx of COPD, HTN, who presented from home on 8/6 with witnessed PEA arrest.  Reportedly underwent CPR and 3 rounds of epi with ROSC in 10 minutes. Estimated down time of 10-18 minutes per chart.  Intubated in field.  She was rewarmed on 8/8 and extubated 8/11. Stay also significant for AKI, and acute systolic HF.  On 8/14 she developed worsening hypoxic respiratory failure as well with concern for ALI vs Pulm edema. She has completed course of abx for PNA.  She currently is still requiring NRB.  Reportedly has refused some chest PT and had some agitated delirium which seems to have improved (?of benzo's and steroids worsening). She has had some improvement in O2 past few days but still requiring NRB.   I spoke with Kennah today who was able to tell me where she is, date, family.  Also able to tell me a little about why she is in hospital (trouble with breathing).  States she only remembers a little about what was going on at home prior to arrest. Difficult to understand her with NRB as well. She reports living at home with her daughter and nephew.  Feels like she is doing better past few days, but aware of O2 requirement as well.  Has a mild headache  today but otherwise no pain. Does not feel SOB.  Appetite improving.  Had foley removed.  Moved bowels yesterday. Not feeling down/depressed.     ROS: Full ROS negative unless otherwise mentioned above    PMH:  Past Medical History  Diagnosis Date  . Hypertension   . Hyperlipidemia 07/17/2011  . COPD (chronic obstructive pulmonary disease)      LKG:MWNUUVOPSH:History reviewed. No pertinent past surgical history. I have reviewed the FH and SH and  If appropriate update it with new information. No Known Allergies Scheduled Meds: . antiseptic oral rinse  7 mL Mouth Rinse QID  . arformoterol  15  mcg Nebulization Q12H  . aspirin  81 mg Oral Daily  . atorvastatin  40 mg Oral q1800  . budesonide (PULMICORT) nebulizer solution  0.5 mg Nebulization BID  . ceFEPime (MAXIPIME) IV  1 g Intravenous Q24H  . chlorhexidine  15 mL Mouth Rinse BID  . diltiazem  120 mg Oral Daily  . heparin subcutaneous  5,000 Units Subcutaneous 3 times per day  . hydrALAZINE  50 mg Oral TID  . ipratropium-albuterol  3 mL Nebulization QID  . metoprolol tartrate  25 mg Oral BID  . nitroGLYCERIN  0.3 mg Transdermal Daily  . nystatin  5 mL Oral QID  . sodium chloride  10-40 mL Intracatheter Q12H  . vancomycin  750 mg Intravenous Q24H   Continuous Infusions: . sodium chloride 10 mL/hr at 03/12/14 2000   PRN Meds:.sodium chloride, acetaminophen, ALPRAZolam, bisacodyl, bisacodyl, docusate sodium, fentaNYL, food thickener, haloperidol lactate, hydrALAZINE, levalbuterol, metoprolol, sodium chloride    BP 100/47  Pulse 107  Temp(Src) 98.2 F (36.8 C) (Oral)  Resp 33  Ht 5\' 3"  (1.6 m)  Wt 62.5 kg (137 lb 12.6 oz)  BMI 24.41 kg/m2  SpO2 91%   PPS: 40 currently   Intake/Output Summary (Last 24 hours) at 03/13/14 1038 Last data filed at 03/13/14 1000  Gross per 24 hour  Intake    440 ml  Output   1425 ml  Net   -985 ml    Physical Exam:  General: Alert, NAD, on NRB HEENT:  [place], sclera anicteric, mmm Neck: supple Chest:   Scattered coarse sounds, symm exp CVS: mild tachy Abdomen: soft, NT, ND Ext: no lower ext edema Neuro: A+Ox3, moves all extremities Skin: warm/dry Psych:appropriate mood and affect  Labs: CBC    Component Value Date/Time   WBC 21.0* 03/13/2014 0500   RBC 3.07* 03/13/2014 0500   HGB 9.6* 03/13/2014 0500   HCT 29.1* 03/13/2014 0500   PLT 326 03/13/2014 0500   MCV 94.8 03/13/2014 0500   MCH 31.3 03/13/2014 0500   MCHC 33.0 03/13/2014 0500   RDW 14.8 03/13/2014 0500   LYMPHSABS 0.8 03/09/2014 0500   MONOABS 0.4 03/09/2014 0500   EOSABS 0.0 03/09/2014 0500   BASOSABS 0.0 03/09/2014  0500    BMET    Component Value Date/Time   NA 156* 03/13/2014 0500   K 4.1 03/13/2014 0500   CL 115* 03/13/2014 0500   CO2 25 03/13/2014 0500   GLUCOSE 125* 03/13/2014 0500   BUN 84* 03/13/2014 0500   CREATININE 2.28* 03/13/2014 0500   CALCIUM 9.0 03/13/2014 0500   GFRNONAA 20* 03/13/2014 0500   GFRAA 23* 03/13/2014 0500    CMP     Component Value Date/Time   NA 156* 03/13/2014 0500   K 4.1 03/13/2014 0500   CL 115* 03/13/2014 0500   CO2 25 03/13/2014 0500   GLUCOSE  125* 03/13/2014 0500   BUN 84* 03/13/2014 0500   CREATININE 2.28* 03/13/2014 0500   CALCIUM 9.0 03/13/2014 0500   PROT 6.0 03/10/2014 0346   ALBUMIN 2.5* 03/10/2014 0346   AST 25 03/10/2014 0346   ALT 22 03/10/2014 0346   ALKPHOS 66 03/10/2014 0346   BILITOT 0.3 03/10/2014 0346   GFRNONAA 20* 03/13/2014 0500   GFRAA 23* 03/13/2014 0500   8/7 CXR IMPRESSION:  Endotracheal tube tip projects 3.5 cm above the carina, nasogastric  tube past the proximal stomach.  Mild cardiomegaly, interstitial and alveolar airspace opacities  suggesting pulmonary edema, less likely ARDS.   8/7 Echo  ** *[facility]* [address] [place], [place] [address] [phone]  ------------------------------------------------------------------- Transthoracic Echocardiography  Patient: Valeska, Courtade MR #: [id number] Study Date: Mar 22, 2014 Gender: F Age: 82 Height: 160 cm Weight: 68.5 kg BSA: 1.76 m^2 Pt. Status: Room: [facility]  Charleen Kirks [id number] PERFORMING [facility], Inpatient SONOGRAPHER Leta Jungling, RDCS  cc:  ------------------------------------------------------------------- LV EF: 35% - 40%  ------------------------------------------------------------------- Indications: Cardiac arrest 427.5.  ------------------------------------------------------------------- History: PMH: Chest pain. Chronic obstructive pulmonary disease. Risk factors:  Hypertension.  ------------------------------------------------------------------- Study Conclusions  - Left ventricle: Inferior akinesis septal hypokinesis. The cavity size was mildly dilated. Systolic function was moderately reduced. The estimated ejection fraction was in the range of 35% to 40%. - Mitral valve: Color settings overgained but MR signal somewhat unusual in apical 4 chamber Likely ischemic MR. There was moderate regurgitation. - Left atrium: The atrium was mildly dilated. - Atrial septum: No defect or patent foramen ovale was identified. - Recommendations: Consider further interrogation of posterior lateral wall and mitral apparatus by more dedicated TTE or TEE.    8/7 CT Head No evidence of intracranial hemorrhage.  Involutional changes. Severe white matter changes suggest chronic  small vessel ischemic disease with bilateral thalamus and basal  ganglia lacunar infarcts.   03/02/14 CT Head IMPRESSION:  1. Atrophy and small vessel disease.  2. 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  3. No evidence for acute intracranial abnormality.    Greater than 50%  of this time was spent counseling and coordinating care related to the above assessment and plan.   Orvis Brill D.O. Palliative Medicine Team at [facility]  Pager: [phone] Team Phone: [phone]

## 2014-03-13 NOTE — Progress Notes (Signed)
SUBJECTIVE:  She continues to feel better. AAO x 3  . antiseptic oral rinse  7 mL Mouth Rinse QID  . arformoterol  15 mcg Nebulization Q12H  . aspirin  81 mg Oral Daily  . atorvastatin  40 mg Oral q1800  . budesonide (PULMICORT) nebulizer solution  0.5 mg Nebulization BID  . ceFEPime (MAXIPIME) IV  1 g Intravenous Q24H  . chlorhexidine  15 mL Mouth Rinse BID  . diltiazem  120 mg Oral Daily  . heparin subcutaneous  5,000 Units Subcutaneous 3 times per day  . hydrALAZINE  50 mg Oral TID  . ipratropium-albuterol  3 mL Nebulization QID  . metoprolol tartrate  25 mg Oral BID  . nitroGLYCERIN  0.3 mg Transdermal Daily  . nystatin  5 mL Oral QID  . sodium chloride  10-40 mL Intracatheter Q12H  . vancomycin  750 mg Intravenous Q24H   OBJECTIVE:   Vitals:   Filed Vitals:   03/13/14 0800 03/13/14 0840 03/13/14 0900 03/13/14 1108  BP: 106/56 106/56 100/47 134/60  Pulse: 96 97 107 44  Temp:    97.3 F (36.3 C)  TempSrc:    Oral  Resp: 24 33 33 30  Height:      Weight:      SpO2: 93% 95% 91% 100%   I&O's:    Intake/Output Summary (Last 24 hours) at 03/13/14 1210 Last data filed at 03/13/14 1000  Gross per 24 hour  Intake    270 ml  Output   1025 ml  Net   -755 ml   TELEMETRY: Reviewed telemetry pt in NSR: PVCs  PHYSICAL EXAM General: Well developed, well nourished, in no acute distress Head: Eyes PERRLA, No xanthomas.   Normal cephalic and atramatic  Lungs:   Scattered rhonchi Heart:   HRRR S1 S2 Pulses are 2+ & equal. Abdomen: Bowel sounds are positive, abdomen soft and non-tender without masses  Extremities:   No clubbing, cyanosis or edema.  DP +1 Neuro: Alert but confused   LABS: Basic Metabolic Panel:  Recent Labs  30/94/07 0500 03/13/14 0500  NA 150* 156*  K 5.0 4.1  CL 111 115*  CO2 24 25  GLUCOSE 124* 125*  BUN 65* 84*  CREATININE 1.87* 2.28*  CALCIUM 9.2 9.0  MG 2.4 2.6*  PHOS 4.2 4.5    Recent Labs  03/12/14 0500 03/13/14 0500  WBC 23.0*  21.0*  HGB 9.9* 9.6*  HCT 30.4* 29.1*  MCV 94.1 94.8  PLT 340 326   Coag Panel:   Lab Results  Component Value Date   INR 1.11 03/13/2014   INR 1.27 03/03/2014   INR 0.96 10/04/2013   RADIOLOGY: Ct Head Wo Contrast  03/02/2014   CLINICAL DATA:  COPD. Cardiac arrest at home on 02/27/2014. Maximum down time 10-18 min.  Marland Kitchen  IMPRESSION: 1. Atrophy and small vessel disease. 2. 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 3.  No evidence for acute intracranial abnormality.     Ct Head Wo Contrast  03/07/2014   CLINICAL DATA:  Rule out bleed, post resuscitation.    IMPRESSION: No evidence of intracranial hemorrhage.  Involutional changes. Severe white matter changes suggest chronic small vessel ischemic disease with bilateral thalamus and basal ganglia lacunar infarcts.     Dg Chest Port 1 View  03/07/2014   CLINICAL DATA:  Acute hypoxia.  EXAM: PORTABLE CHEST - 1 VIEW  COMPARISON:  IMPRESSION: 1. Persistent left basilar airspace opacity, with mildly  worsening bilateral mid lung zone airspace opacities, concerning for worsening multifocal pneumonia. Suspect small left pleural effusion. 2. Mild cardiomegaly.       ASSESSMENT AND PLAN:   73 yo with history of COPD on home oxygen had PEA arrest yesterday, now intubated and s/p cooling   1. Cardiac arrest: PEA. Suspect primary respiratory arrest. Now rewarmed and off sedation. Head CT with nothing acute. She is awake and eyes are moving. EEG showed diffuse cerebral dysfunction that is nonspecific c/w hypoxic/ischemic injury. She is AAO x 3 this am.  2. Pulmonary: Severe COPD, suspect respiratory infection vs aspiration pneumonia vs ARDS with increasing oxygen demands.  Critical care is suggesting palliative care. The patient actually feels better.  On vanco/Zosyn. WBC elevated, sed rate 112. Amio stopped for stopped considering possible acute amio toxicity.   3. Hypertension - add cardizem CD 120 mg po daily.  4.  Tachycardia - now in NSR on PO Cardizem and BB   5. Elevated troponin: Mild TnI elevation to 1.4, steady. Suspect this is most likely demand ischemia related to PEA arrest/hypotension. Has baseline LBBB. Echo showed EF 35-40% with wall motion abnormalities, this is worse than prior (EF 50%). Could be stunning related to arrest.  - Continue ASA, statin  - no BB secondary to severe COPD  - At this time would not pursue cath due to worsening renal function and altered neuro status and renal failure  7. A/C renal failure, worsenng Crea, hold lasix   8. Anemia - received 1 unit PRBCs on 8/15, Hb stable  9. Acute systolic CHF -  Negative 7 liters since the admission, now dry and worsening Crea, we will hold lasix.   Lars MassonNELSON, Tomika Eckles H, MD  03/13/2014  12:10 PM

## 2014-03-13 NOTE — Progress Notes (Signed)
Speech Language Pathology Treatment: Dysphagia  Patient Details Name: Melanie Williamson MRN: [id number] DOB: [birth year] Today's Date: 03/13/2014 Time: 8016-5537 SLP Time Calculation (min): 10 min  Assessment / Plan / Recommendation Clinical Impression  Seen for dysphagia treatment with daughter at bedside.  Pt. More lucid today and aware.  Educated daughter on results of MBS yesterday and recommendations.  No evidence of aspiration with 3 sips nectar thick liquid.  Reviewed compensatory strategies with pt./family.  Continue Dys 1 texture and nectar thick liquids with ST.   HPI HPI: 73 year old female with PMH: HTN, hyperlipidenia admitted with respiratory disress and pt. found to be in PEA with CPR initiated.  CXR Left lower lung consolidations/atelectasis and small left pleural effusion. Found to have severe COPD.  INtubated 8/7-8/11.  ST ordered for ability to initiate po's.   Pertinent Vitals Pain Assessment: No/denies pain  SLP Plan  Continue with current plan of care    Recommendations Diet recommendations: Dysphagia 1 (puree);Nectar-thick liquid Liquids provided via: Cup;No straw Medication Administration: Whole meds with puree Supervision: Patient able to self feed;Staff to assist with self feeding;Full supervision/cueing for compensatory strategies Compensations: Slow rate;Small sips/bites Postural Changes and/or Swallow Maneuvers: Seated upright 90 degrees;Upright 30-60 min after meal              Oral Care Recommendations: Oral care BID Follow up Recommendations: Skilled Nursing facility Plan: Continue with current plan of care    GO     [name] 03/13/2014, 11:59 AM  [phone]

## 2014-03-13 NOTE — Progress Notes (Signed)
Dr. Craige Cotta notified of patient's low BP of 77/43. Patient is AxOx3 and appears to be asymptomatic. New orders received. Will continue to monitor.

## 2014-03-13 NOTE — Progress Notes (Signed)
Chaplain responded to referral from RN concerning pt with multiple needs. Pt present with her grandson and his wife, along with their young child. Grandson stated that pt has expensive apartment lease which they would like to end due to pt's prolonged stay at hospital, but that they aren't able to do this without notarized document. Chaplain explained that while our department has a notary, this may be a Interior and spatial designer issue. Pt and family are interested in healthcare POA. Chaplain delivered form and explained it. Family will page chaplain tomorrow when they are ready to notarize.

## 2014-03-14 LAB — BASIC METABOLIC PANEL
Anion gap: 15 (ref 5–15)
BUN: 91 mg/dL — ABNORMAL HIGH (ref 6–23)
CO2: 23 mEq/L (ref 19–32)
Calcium: 8.7 mg/dL (ref 8.4–10.5)
Chloride: 119 mEq/L — ABNORMAL HIGH (ref 96–112)
Creatinine, Ser: 2.41 mg/dL — ABNORMAL HIGH (ref 0.50–1.10)
GFR, EST AFRICAN AMERICAN: 22 mL/min — AB (ref >90)
GFR, EST NON AFRICAN AMERICAN: 19 mL/min — AB (ref >90)
Glucose, Bld: 120 mg/dL — ABNORMAL HIGH (ref 70–99)
POTASSIUM: 3.8 meq/L (ref 3.7–5.3)
SODIUM: 157 meq/L — AB (ref 137–147)

## 2014-03-14 LAB — GLUCOSE, CAPILLARY
GLUCOSE-CAPILLARY: 98 mg/dL (ref 70–99)
Glucose-Capillary: 127 mg/dL — ABNORMAL HIGH (ref 70–99)

## 2014-03-14 LAB — CBC
HCT: 29 % — ABNORMAL LOW (ref 36.0–46.0)
Hemoglobin: 9.2 g/dL — ABNORMAL LOW (ref 12.0–15.0)
MCH: 30.9 pg (ref 26.0–34.0)
MCHC: 31.7 g/dL (ref 30.0–36.0)
MCV: 97.3 fL (ref 78.0–100.0)
PLATELETS: 322 10*3/uL (ref 150–400)
RBC: 2.98 MIL/uL — AB (ref 3.87–5.11)
RDW: 15 % (ref 11.5–15.5)
WBC: 19.9 10*3/uL — AB (ref 4.0–10.5)

## 2014-03-14 MED ORDER — HYDRALAZINE HCL 10 MG PO TABS
10.0000 mg | ORAL_TABLET | Freq: Three times a day (TID) | ORAL | Status: DC
Start: 2014-03-14 — End: 2014-03-16
  Administered 2014-03-14 – 2014-03-15 (×2): 10 mg via ORAL
  Filled 2014-03-14 (×7): qty 1

## 2014-03-14 MED ORDER — SODIUM CHLORIDE 0.9 % IV SOLN
INTRAVENOUS | Status: AC
  Administered 2014-03-14: 10:00:00 via INTRAVENOUS

## 2014-03-14 MED ORDER — HALOPERIDOL LACTATE 5 MG/ML IJ SOLN: 1.0000 mg | Freq: Four times a day (QID) | INTRAMUSCULAR | Status: DC | PRN

## 2014-03-14 MED ORDER — SODIUM CHLORIDE 0.9 % IV SOLN
Freq: Once | INTRAVENOUS | Status: AC
  Administered 2014-03-14: 14:00:00 via INTRAVENOUS

## 2014-03-14 MED ORDER — METOPROLOL TARTRATE 12.5 MG HALF TABLET
12.5000 mg | ORAL_TABLET | Freq: Two times a day (BID) | ORAL | Status: DC
  Administered 2014-03-14: 12.5 mg via ORAL
  Filled 2014-03-14 (×3): qty 1

## 2014-03-14 MED ORDER — SODIUM CHLORIDE 0.9 % IV BOLUS (SEPSIS)
500.0000 mL | Freq: Once | INTRAVENOUS | Status: AC
  Administered 2014-03-14: 500 mL via INTRAVENOUS

## 2014-03-14 NOTE — Progress Notes (Signed)
[facility]  Melanie Williamson NFA:[id number] DOB: Feb 03, 1941 DOA: 04-Mar-2014 PCP: Warrick Parisian, MD   Brief narrative: 73 yo BF PMHx with chronic respiratory failure with hypoxia, COPD (on nocturnal oxygen), acute systolic CHF, paroxysmal atrial fibrillation, chronic kidney disease stage III, admitted 8/6 after suffering a witnessed cardiac arrest at home. She was intubated in field and ROSC was achieved with 10 mins of CPR and epi x 3. Maximum total downtime 10-18 minutes based on EMS. Required hypothermic protocol.  After admit found with elevated TNI, new systolic HF with shock and AF requiring Amiodarone. It was also suspected she may have aspirated at time of arrest. After extubation she was confused but stable from a neurological standpoint but then began to slur her words and not using her right hand. Neurology was consulted. EEG done earlier while on the vent with sharp waves seen over the posterior quadrant regions indicate possible epileptogenic potential from the occipital and right posterior temporal regions. There were no electrographic seizures seen. She has subsequently developed more defined left basilar infiltrate with leukocytosis and is on broad spectrum anbx's  Care assumed by [facility] 8/14 but due to progressive respiratory failure with associated LLL collapse she was sent back to the ICU on 8/15. Unclear if also ARDS or pulmonary edema. She required BiPAP and due to previously expressed desire for no ETT.mechanical ventilation was not considered.  SIGNIFICANT EVENTS:  8/8 Rewarming started at 6:30 am, off pressors  8/11 extubated, briefly required BIPAP for pulm edema  8/14 Transfered to SDU  8/15 PCCM reconsulted for worsening resp failure with hypoxemia. CXR showed LLL collapse with cutoff sign  8/15 Comfortable on NRB.Chest percussion and nebulized BDs ordered  8/16 oxygenation improved some. LLL collapse improved. Agitated delirium. Systemic  steroids and PRN lorazepam discontinued  8/17 remains on 100% NRB, made LCB with no CPR/cardioversion/ETT. Cefepime/vanc restarted.  8/18 Venti mask trial on 14L at 55% resulted in 87-77% O2 in 3 minutes  HPI/Subjective: Awake and much more alert than 1 week prior - still SOB but improved  Assessment/Plan:  Cardiac arrest/ PEA /demand ischemia  TNI peaked 2.97 and cards felt was due to demand ischemia in setting of primary respiratory event. No plans for cath at this time due to AMS and CKD with ARF  Acute respiratory failure with hypoxia:   A) Acute systolic congestive heart failure, NYHA class 2 (EF 35-40%)   B) HCAP with LLL collapse   C) Acute lung injury   D) Severe COPD (compensated)-O2 dependent Heart failure management per Cardiology. She has been aggressively diuresed since admission and now weight down by 13 lbs and laboratory data is consistent with volume depletion now requiring IVFs. She continues to require high dose oxygen and her white count has not normalized despite remaining afebrile so will continue Cefepime and Vanco. Cultures have been unrevealing. Unfortunately she has refused vibra vest and this may be contributing to her persistent leukocytosis. She does utilize the flutter valve and PT has been assisting her OOB to chair.  Hypernatremia Na has risen post diuresis with intermittent hypotension. She responded to fluid challenges and gentle IV hydration for 12 hrs has been initiated. Lasix was time limited and has been dc'd.    Acute renal failure on CKD, stage III Baseline renal function is 22/1.46. Her renal function has worsened in setting of dehydration with BUN 91 and Scr 2.41 on 8/21.  Altered mental state/Dysarthria Her mentation has improved considerably.  Her head CT was non acute. An EEG demonstrated nonspecific changes such as hypoxia/ischemic injury. SLP recommended a D1 diet.  Oral thrush Nystatin S/S initiated on 8/20  Profound  deconditioning Palliaitive working with pt and family and pt confirmed desires no ETT/mech ventilation, CPR or defib. PT/OT recommend SNF  HTN  BP has been soft to low over the past several days prompting dose reductions in metoprolol and hydralazine and discontinuation of Nitropatch  Atrial tachycardia/PAF Currently maintaining NSR on metoprolol and Cardizem. Amiodarone was dc'd due to concerns of toxicity  Anemia Hgb has remained stable around 9 but need to follow post hydration.  DVT prophylaxis: Subcutaneous heparin Code Status: Full Family Communication: No family at bedside Disposition Plan/Expected LOS: Stepdown  Consultants: Cardiology PCCM Neurology-signed off  Procedures: 8/7 Echo >> EF 35 to 40%, mod MR  8/7 Doppler legs b/l >> no DVT 8/10 EEG>>diffuse cerebral dysfunction that is non-specific in etiology and can be seen with hypoxic/ischemic injury, toxic/metabolic encephalopathies, or medication effect. Sharp waves seen over the posterior quadrant regions indicate possible epileptogenic potential from the occipital and right posterior temporal regions. There were no electrographic seizures seen in this study  8/11 renal ultrasound > no hydro    Antibiotics: Levaquin 8/7-8/9  Zosyn 8/7 >> 8/16  Vanc 8/7 >> 8/16>>>8/17>> Cefepime 8/17>>  Objective: Blood pressure 80/33, pulse 83, temperature 97.9 F (36.6 C), temperature source Oral, resp. rate 27, height 5\' 3"  (1.6 m), weight 137 lb 9.1 oz (62.4 kg), SpO2 90.00%.  Intake/Output Summary (Last 24 hours) at 03/14/14 1340 Last data filed at 03/14/14 1200  Gross per 24 hour  Intake   1250 ml  Output    225 ml  Net   1025 ml   Exam: Gen: No acute respiratory distress-OOB to chair Neuro: Awake and alert, speech occ difficult to understand- only mildly confused Chest: Coarse to auscultation bilaterally with exp wheezes anteriorly, 100% NRB Cardiac: Regular rate and rhythm, S1-S2, no rubs murmurs or gallops, no  peripheral edema, no JVD Abdomen: Soft nontender nondistended, no ascites Extremities: Symmetrical in appearance without cyanosis, clubbing or effusion  Scheduled Meds:  Scheduled Meds: . antiseptic oral rinse  7 mL Mouth Rinse QID  . arformoterol  15 mcg Nebulization Q12H  . aspirin  81 mg Oral Daily  . atorvastatin  40 mg Oral q1800  . budesonide (PULMICORT) nebulizer solution  0.5 mg Nebulization BID  . ceFEPime (MAXIPIME) IV  1 g Intravenous Q24H  . chlorhexidine  15 mL Mouth Rinse BID  . diltiazem  120 mg Oral Daily  . heparin subcutaneous  5,000 Units Subcutaneous 3 times per day  . hydrALAZINE  25 mg Oral TID  . ipratropium-albuterol  3 mL Nebulization QID  . metoprolol tartrate  25 mg Oral BID  . nystatin  5 mL Oral QID  . sodium chloride  10-40 mL Intracatheter Q12H  . [START ON 03/15/2014] vancomycin  750 mg Intravenous Q48H   Data Reviewed: Basic Metabolic Panel:  Recent Labs Lab 03/09/14 0500 03/10/14 0346 03/11/14 0418 03/12/14 0500 03/13/14 0500 03/14/14 0430  NA 151* 145 145 150* 156* 157*  K 3.2* 3.8 4.3 5.0 4.1 3.8  CL 108 108 110 111 115* 119*  CO2 25 23 22 24 25 23   GLUCOSE 156* 139* 128* 124* 125* 120*  BUN 69* 73* 64* 65* 84* 91*  CREATININE 2.68* 2.24* 1.89* 1.87* 2.28* 2.41*  CALCIUM 9.0 8.8 8.5 9.2 9.0 8.7  MG 2.3  --  2.3 2.4  2.6*  --   PHOS  --   --  5.2* 4.2 4.5  --    Liver Function Tests:  Recent Labs Lab 03/09/14 0500 03/10/14 0346  AST 22 25  ALT 21 22  ALKPHOS 68 66  BILITOT 0.4 0.3  PROT 6.2 6.0  ALBUMIN 2.5* 2.5*   CBC:  Recent Labs Lab 03/08/14 1257 03/09/14 0500 03/10/14 0346 03/11/14 0418 03/12/14 0500 03/13/14 0500 03/14/14 0430  WBC 16.5* 13.6* 23.7* 17.6* 23.0* 21.0* 19.9*  NEUTROABS 13.2* 12.4*  --   --   --   --   --   HGB 10.4* 9.5* 9.5* 9.1* 9.9* 9.6* 9.2*  HCT 31.7* 29.1* 29.6* 28.3* 30.4* 29.1* 29.0*  MCV 91.9 93.3 94.6 95.0 94.1 94.8 97.3  PLT 269 251 316 293 340 326 322   Cardiac  Enzymes:  Recent Labs Lab 03/08/14 0032 03/08/14 0632 03/08/14 1232  TROPONINI 1.13* 1.19* 1.10*   BNP (last 3 results)  Recent Labs  03/05/14 1058 03/08/14 0032  PROBNP 38198.0* 50443.0*   CBG:  Recent Labs Lab 03/11/14 0737 03/11/14 1234  GLUCAP 109* 127*    Recent Results (from the past 240 hour(s))  CLOSTRIDIUM DIFFICILE BY PCR     Status: None   Collection Time    03/06/14  8:59 AM      Result Value Ref Range Status   C difficile by pcr NEGATIVE  NEGATIVE Final  CULTURE, BLOOD (ROUTINE X 2)     Status: None   Collection Time    03/10/14 11:40 AM      Result Value Ref Range Status   Specimen Description BLOOD LEFT HAND   Final   Special Requests BOTTLES DRAWN AEROBIC ONLY 1CC   Final   Culture  Setup Time     Final   Value: 03/10/2014 17:07     [facility]formed at [facility]   Culture     Final   Value:        BLOOD CULTURE RECEIVED NO GROWTH TO DATE CULTURE WILL BE HELD FOR 5 DAYS BEFORE ISSUING A FINAL NEGATIVE REPORT     Performed at [facility]   Report Status PENDING   Incomplete  CULTURE, BLOOD (ROUTINE X 2)     Status: None   Collection Time    03/10/14 11:45 AM      Result Value Ref Range Status   Specimen Description BLOOD RIGHT HAND   Final   Special Requests BOTTLES DRAWN AEROBIC ONLY 1CC   Final   Culture  Setup Time     Final   Value: 03/10/2014 17:06     Performed at [facility]   Culture     Final   Value:        BLOOD CULTURE RECEIVED NO GROWTH TO DATE CULTURE WILL BE HELD FOR 5 DAYS BEFORE ISSUING A FINAL NEGATIVE REPORT     Performed at [facility]   Report Status PENDING   Incomplete  URINE CULTURE     Status: None   Collection Time    03/10/14  3:54 PM      Result Value Ref Range Status   Specimen Description URINE, RANDOM   Final   Special Requests NONE   Final   Culture  Setup Time     Final   Value: 03/10/2014 16:45     Performed at [facility]   Colony Count     Final   Value: 50,000  COLONIES/ML  Performed at [facility]     Final   Value: YEAST     Performed at [facility]   Report Status 03/11/2014 FINAL   Final     Studies:  Recent x-ray studies have been reviewed in detail by the Attending Physician  Time spent :  35 mins   Junious Silk, ANP [facility] Office  [phone] Pager [phone]  **If unable to reach the above provider after paging please contact the Flow Manager @ [phone]  On-Call/Text Page:      [email or web address]      password [email or web address]  If 7PM-7AM, please contact night-coverage [email or web address] Password [email or web address] 03/14/2014, 1:40 PM   LOS: 14 days   I have personally examined this patient and reviewed the entire database. I have reviewed the above note, made any necessary editorial changes, and agree with its content.  Lonia Blood, MD [facility]

## 2014-03-14 NOTE — Progress Notes (Signed)
SUBJECTIVE:  She continues to feel better. AAO x 3, sitting in chair with only nasal canula  . antiseptic oral rinse  7 mL Mouth Rinse QID  . arformoterol  15 mcg Nebulization Q12H  . aspirin  81 mg Oral Daily  . atorvastatin  40 mg Oral q1800  . budesonide (PULMICORT) nebulizer solution  0.5 mg Nebulization BID  . ceFEPime (MAXIPIME) IV  1 g Intravenous Q24H  . chlorhexidine  15 mL Mouth Rinse BID  . diltiazem  120 mg Oral Daily  . heparin subcutaneous  5,000 Units Subcutaneous 3 times per day  . hydrALAZINE  25 mg Oral TID  . ipratropium-albuterol  3 mL Nebulization QID  . metoprolol tartrate  25 mg Oral BID  . nystatin  5 mL Oral QID  . sodium chloride  10-40 mL Intracatheter Q12H  . [START ON 03/15/2014] vancomycin  750 mg Intravenous Q48H   OBJECTIVE:   Vitals:   Filed Vitals:   03/14/14 1004 03/14/14 1100 03/14/14 1115 03/14/14 1145  BP: 118/63 180/52 180/52 121/58  Pulse: 92 89 91 89  Temp:   97.9 F (36.6 C)   TempSrc:   Oral   Resp:  23 37 31  Height:      Weight:      SpO2:  91% 92% 93%   I&O's:    Intake/Output Summary (Last 24 hours) at 03/14/14 1153 Last data filed at 03/14/14 1100  Gross per 24 hour  Intake   1220 ml  Output    575 ml  Net    645 ml   TELEMETRY: Reviewed telemetry pt in NSR: PVCs  PHYSICAL EXAM General: Well developed, well nourished, in no acute distress Head: Eyes PERRLA, No xanthomas.   Normal cephalic and atramatic  Lungs:   Scattered rhonchi Heart:   HRRR S1 S2 Pulses are 2+ & equal. Abdomen: Bowel sounds are positive, abdomen soft and non-tender without masses  Extremities:   No clubbing, cyanosis or edema.  DP +1 Neuro: Alert but confused   LABS: Basic Metabolic Panel:  Recent Labs  16/04/9607/19/15 0500 03/13/14 0500 03/14/14 0430  NA 150* 156* 157*  K 5.0 4.1 3.8  CL 111 115* 119*  CO2 24 25 23   GLUCOSE 124* 125* 120*  BUN 65* 84* 91*  CREATININE 1.87* 2.28* 2.41*  CALCIUM 9.2 9.0 8.7  MG 2.4 2.6*  --   PHOS 4.2  4.5  --     Recent Labs  03/13/14 0500 03/14/14 0430  WBC 21.0* 19.9*  HGB 9.6* 9.2*  HCT 29.1* 29.0*  MCV 94.8 97.3  PLT 326 322   Coag Panel:   Lab Results  Component Value Date   INR 1.11 03/22/2014   INR 1.27 03/07/2014   INR 0.96 10/04/2013   RADIOLOGY: Ct Head Wo Contrast  03/02/2014   CLINICAL DATA:  COPD. Cardiac arrest at home on 02/27/2014. Maximum down time 10-18 min.  Marland Kitchen.  IMPRESSION: 1. Atrophy and small vessel disease. 2. 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 3.  No evidence for acute intracranial abnormality.     Ct Head Wo Contrast  02/25/2014   CLINICAL DATA:  Rule out bleed, post resuscitation.    IMPRESSION: No evidence of intracranial hemorrhage.  Involutional changes. Severe white matter changes suggest chronic small vessel ischemic disease with bilateral thalamus and basal ganglia lacunar infarcts.     Dg Chest Port 1 View  03/07/2014   CLINICAL DATA:  Acute hypoxia.  EXAM: PORTABLE CHEST - 1 VIEW  COMPARISON:  IMPRESSION: 1. Persistent left basilar airspace opacity, with mildly worsening bilateral mid lung zone airspace opacities, concerning for worsening multifocal pneumonia. Suspect small left pleural effusion. 2. Mild cardiomegaly.       ASSESSMENT AND PLAN:   73 yo with history of COPD on home oxygen had PEA arrest yesterday, s/p cooling, extubated with prolonged neurologic recovery, now AAO x 3  1. Cardiac arrest: PEA. Suspect primary respiratory arrest. Now rewarmed and off sedation. Head CT with nothing acute. She is AAO x 3.  2. Pulmonary: Severe COPD, suspect respiratory infection vs aspiration pneumonia vs ARDS with increasing oxygen demands.  Critical care is suggesting palliative care. The patient actually feels better.  On vanco/cefepime. WBC elevated, sed rate 112. Amio stopped for stopped considering possible acute amio toxicity.   3. Hypertension - add cardizem CD 120 mg po daily.  4. Tachycardia -  now in NSR on PO Cardizem and BB   5. Elevated troponin: Mild TnI elevation to 1.4, steady. Suspect this is most likely demand ischemia related to PEA arrest/hypotension. Has baseline LBBB. Echo showed EF 35-40% with wall motion abnormalities, this is worse than prior (EF 50%). Could be stunning related to arrest.  - Continue ASA, statin  - no BB secondary to severe COPD  - At this time would not pursue cath due to worsening renal function and altered neuro status and renal failure  7. A/C renal failure, worsening Crea, hold lasix   8. Anemia - received 1 unit PRBCs on 8/15, Hb stable  9. Acute systolic CHF -  Negative 7 liters since the admission, now dry and worsening Crea, we will hold lasix.   Lars Masson, MD  03/14/2014  11:53 AM

## 2014-03-14 NOTE — Progress Notes (Signed)
Physical Therapy Treatment Patient Details Name: Melanie Williamson MRN: [id number] DOB: 17-Jan-1941 Today's Date: 03/14/2014    History of Present Illness Melanie Williamson is an 73 y.o. female with a past medical history significant for HTN, hyperlipidemia, COPD on nocturnal oxygen, admitted after suffering a witnessed cardiac arrest at home. She was intubated in field and ROSC was achieved with 10 mins of CPR and epi x 3. Maximum total downtime 10-18 minutes based on EMS.  She was extubated 8/11 and developed worsening resp failure with hypoxemia.  Chest CXR showed LLL collapse.  Has been on 100% NRB    PT Comments    Pt progressing slowly towards physical therapy goals. Pt fatigues quickly and requires frequent cueing for pursed-lip breathing, as O2 sats decreased to 73% after transfer to bed. Pt left with HOB elevated and pt performing breathing techniques - O2 sats had improved to 85% and RN outside of room.   Follow Up Recommendations  SNF;Supervision/Assistance - 24 hour     Equipment Recommendations  Rolling walker with 5" wheels    Recommendations for Other Services       Precautions / Restrictions Precautions Precautions: Fall Restrictions Weight Bearing Restrictions: No    Mobility  Bed Mobility Overal bed mobility: Needs Assistance Bed Mobility: Sit to Supine       Sit to supine: Mod assist;+2 for physical assistance;+2 for safety/equipment   General bed mobility comments: While sitting EOB pt states she is tired and begins to let herself fall backwards on the bed to lay back down. Therapist and tech quickly providing support at the trunk to get pt positioned properly and safely.   Transfers Overall transfer level: Needs assistance Equipment used: 2 person hand held assist Transfers: Sit to/from [employer] Sit to Stand: Min assist;+2 physical assistance;+2 safety/equipment Stand pivot transfers: Mod assist;+2 physical assistance;+2 safety/equipment        General transfer comment: Max cueing for safety.   Ambulation/Gait             General Gait Details: Unable   Stairs            Wheelchair Mobility    Modified Rankin (Stroke Patients Only)       Balance Overall balance assessment: Needs assistance Sitting-balance support: Feet supported;Bilateral upper extremity supported Sitting balance-Leahy Scale: Poor     Standing balance support: Bilateral upper extremity supported;During functional activity Standing balance-Leahy Scale: Zero                      Cognition Arousal/Alertness: Awake/alert Behavior During Therapy: WFL for tasks assessed/performed Overall Cognitive Status: Impaired/Different from baseline Area of Impairment: Attention;Following commands;Safety/judgement;Awareness;Problem solving   Current Attention Level: Sustained   Following Commands: Follows one step commands consistently Safety/Judgement: Decreased awareness of deficits;Decreased awareness of safety Awareness: Intellectual Problem Solving: Slow processing;Difficulty sequencing;Requires verbal cues;Requires tactile cues General Comments: Impulsive with intiating mobility.     Exercises      General Comments        Pertinent Vitals/Pain Pain Assessment: No/denies pain    Home Living                      Prior Function            PT Goals (current goals can now be found in the care plan section) Acute Rehab PT Goals Patient Stated Goal: To get better PT Goal Formulation: With patient Time For Goal Achievement: 03/26/14 Potential to Achieve  Goals: Fair Progress towards PT goals: Progressing toward goals    Frequency  Min 2X/week    PT Plan Current plan remains appropriate    Co-evaluation             End of Session Equipment Utilized During Treatment: Gait belt;Oxygen Activity Tolerance: Patient limited by fatigue Patient left: in bed;with call bell/phone within reach;with bed alarm set      Time: 1435-1454 PT Time Calculation (min): 19 min  Charges:  $Therapeutic Activity: 8-22 mins                    G Codes:      Ruthann CancerHamilton, Vadim Centola 03/14/2014, 4:19 PM  Ruthann CancerLaura Hamilton, PT, DPT Acute Rehabilitation Services Pager: [phone]

## 2014-03-14 NOTE — Progress Notes (Signed)
Occupational Therapy Treatment Patient Details Name: Melanie Williamson MRN: [id number] DOB: 11/23/1940 Today's Date: 03/14/2014    History of present illness Melanie Williamson is an 73 y.o. female with a past medical history significant for HTN, hyperlipidemia, COPD on nocturnal oxygen, admitted after suffering a witnessed cardiac arrest at home. She was intubated in field and ROSC was achieved with 10 mins of CPR and epi x 3. Maximum total downtime 10-18 minutes based on EMS.  She was extubated 8/11 and developed worsening resp failure with hypoxemia.  Chest CXR showed LLL collapse.  Has been on 100% NRB   OT comments  Pt fatigued this afternoon after sitting up in chair.  Pt performed bil. UE strengthening exercises, bed level.  02 sats 85-92% on NRB.   Follow Up Recommendations  LTACH;Supervision/Assistance - 24 hour    Equipment Recommendations  3 in 1 bedside comode    Recommendations for Other Services      Precautions / Restrictions Precautions Precautions: Fall Precaution Comments: High 02 requirements Restrictions Weight Bearing Restrictions: No       Mobility Bed Mobility Overal bed mobility: Needs Assistance Bed Mobility: Sit to Supine       Sit to supine: Mod assist;+2 for physical assistance;+2 for safety/equipment   General bed mobility comments: While sitting EOB pt states she is tired and begins to let herself fall backwards on the bed to lay back down. Therapist and tech quickly providing support at the trunk to get pt positioned properly and safely.   Transfers Overall transfer level: Needs assistance Equipment used: 2 person hand held assist Transfers: Sit to/from UGI CorporationStand;Stand Pivot Transfers Sit to Stand: Min assist;+2 physical assistance;+2 safety/equipment Stand pivot transfers: Mod assist;+2 physical assistance;+2 safety/equipment       General transfer comment: Max cueing for safety.     Balance Overall balance assessment: Needs assistance Sitting-balance  support: Feet supported;Bilateral upper extremity supported Sitting balance-Leahy Scale: Poor     Standing balance support: Bilateral upper extremity supported;During functional activity Standing balance-Leahy Scale: Zero                     ADL   Eating/Feeding: Minimal assistance Eating/Feeding Details (indicate cue type and reason): min A to drink from cup                                   General ADL Comments: Pt fatigued this pm.  Participated in bil. UE strengthening exercises at bed level      Vision                     Perception     Praxis      Cognition   Behavior During Therapy: [facility]FL for tasks assessed/performed Overall Cognitive Status: Impaired/Different from baseline Area of Impairment: Attention;Following commands;Safety/judgement;Awareness;Problem solving   Current Attention Level: Sustained    Following Commands: Follows one step commands consistently Safety/Judgement: Decreased awareness of deficits;Decreased awareness of safety Awareness: Intellectual Problem Solving: Slow processing;Difficulty sequencing;Requires verbal cues;Requires tactile cues General Comments: Pt easily distracted    Extremity/Trunk Assessment               Exercises General Exercises - Upper Extremity Shoulder Flexion: Strengthening;Right;Left;5 reps;Supine Shoulder Extension: Strengthening;Right;Left;5 reps;Supine Shoulder ABduction: Strengthening;Right;Left;5 reps;Supine Shoulder ADduction: Strengthening;Right;Left;5 reps;Supine Shoulder Horizontal ABduction: Strengthening;Right;Both;Supine Shoulder Horizontal ADduction: Strengthening;Right;Left;5 reps;Supine Elbow Flexion: Strengthening;Right;Left;5 reps;Supine Elbow Extension: Strengthening;Right;Left;5 reps;Supine   Shoulder Instructions  General Comments      Pertinent Vitals/ Pain       Pain Assessment: No/denies pain  Home Living                                           Prior Functioning/Environment              Frequency Min 2X/week     Progress Toward Goals  OT Goals(current goals can now be found in the care plan section)     Acute Rehab OT Goals Patient Stated Goal: To get better ADL Goals Pt Will Perform Eating: with set-up;sitting Pt Will Perform Grooming: with set-up;sitting Pt Will Perform Upper Body Bathing: with min assist;sitting Pt Will Perform Lower Body Bathing: with mod assist;sit to/from stand Pt Will Perform Upper Body Dressing: with mod assist;sitting Pt Will Perform Lower Body Dressing: with mod assist;sit to/from stand Pt Will Transfer to Toilet: with min assist;ambulating;regular height toilet;bedside commode;grab bars Pt Will Perform Toileting - Clothing Manipulation and hygiene: with min assist;sit to/from stand  Plan Discharge plan remains appropriate    Co-evaluation                 End of Session Equipment Utilized During Treatment: Other (comment);Oxygen   Activity Tolerance Patient limited by fatigue   Patient Left in bed;with call bell/phone within reach;with family/visitor present   Nurse Communication Mobility status        Time: 4403-4742 OT Time Calculation (min): 20 min  Charges: OT General Charges $OT Visit: 1 Procedure OT Treatments $Therapeutic Exercise: 8-22 mins  Melanie Williamson 03/14/2014, 6:46 PM

## 2014-03-14 NOTE — Progress Notes (Signed)
Transferred from 2H09 by bed awake and alert. Dressing to right upper arm soaked with blood, dressing changed.

## 2014-03-15 ENCOUNTER — Inpatient Hospital Stay (HOSPITAL_COMMUNITY): Payer: Medicare Other

## 2014-03-15 DIAGNOSIS — I517 Cardiomegaly: Secondary | ICD-10-CM

## 2014-03-15 DIAGNOSIS — I469 Cardiac arrest, cause unspecified: Secondary | ICD-10-CM

## 2014-03-15 LAB — COMPREHENSIVE METABOLIC PANEL
ALT: 25 U/L (ref 0–35)
AST: 24 U/L (ref 0–37)
Albumin: 2.3 g/dL — ABNORMAL LOW (ref 3.5–5.2)
Alkaline Phosphatase: 80 U/L (ref 39–117)
Anion gap: 13 (ref 5–15)
BUN: 85 mg/dL — ABNORMAL HIGH (ref 6–23)
CO2: 23 meq/L (ref 19–32)
CREATININE: 2.42 mg/dL — AB (ref 0.50–1.10)
Calcium: 8.8 mg/dL (ref 8.4–10.5)
Chloride: 123 mEq/L — ABNORMAL HIGH (ref 96–112)
GFR, EST AFRICAN AMERICAN: 22 mL/min — AB (ref >90)
GFR, EST NON AFRICAN AMERICAN: 19 mL/min — AB (ref >90)
GLUCOSE: 107 mg/dL — AB (ref 70–99)
Potassium: 4.3 mEq/L (ref 3.7–5.3)
Sodium: 159 mEq/L — ABNORMAL HIGH (ref 137–147)
Total Bilirubin: 0.4 mg/dL (ref 0.3–1.2)
Total Protein: 6 g/dL (ref 6.0–8.3)

## 2014-03-15 LAB — CBC
HCT: 26.9 % — ABNORMAL LOW (ref 36.0–46.0)
HEMATOCRIT: 28.1 % — AB (ref 36.0–46.0)
HEMOGLOBIN: 8.6 g/dL — AB (ref 12.0–15.0)
Hemoglobin: 8.5 g/dL — ABNORMAL LOW (ref 12.0–15.0)
MCH: 31.4 pg (ref 26.0–34.0)
MCH: 30.7 pg (ref 26.0–34.0)
MCHC: 31.6 g/dL (ref 30.0–36.0)
MCHC: 30.6 g/dL (ref 30.0–36.0)
MCV: 99.3 fL (ref 78.0–100.0)
MCV: 100.4 fL — AB (ref 78.0–100.0)
PLATELETS: 283 10*3/uL (ref 150–400)
Platelets: 268 10*3/uL (ref 150–400)
RBC: 2.71 MIL/uL — AB (ref 3.87–5.11)
RBC: 2.8 MIL/uL — AB (ref 3.87–5.11)
RDW: 15.3 % (ref 11.5–15.5)
RDW: 15.7 % — ABNORMAL HIGH (ref 11.5–15.5)
WBC: 15.9 10*3/uL — ABNORMAL HIGH (ref 4.0–10.5)
WBC: 14.8 10*3/uL — ABNORMAL HIGH (ref 4.0–10.5)

## 2014-03-15 LAB — DIFFERENTIAL
Basophils Absolute: 0 10*3/uL (ref 0.0–0.1)
Basophils Relative: 0 % (ref 0–1)
EOS ABS: 0.4 10*3/uL (ref 0.0–0.7)
Eosinophils Relative: 3 % (ref 0–5)
Lymphocytes Relative: 10 % — ABNORMAL LOW (ref 12–46)
Lymphs Abs: 1.4 10*3/uL (ref 0.7–4.0)
Monocytes Absolute: 1.1 10*3/uL — ABNORMAL HIGH (ref 0.1–1.0)
Monocytes Relative: 7 % (ref 3–12)
NEUTROS PCT: 80 % — AB (ref 43–77)
Neutro Abs: 11.9 10*3/uL — ABNORMAL HIGH (ref 1.7–7.7)

## 2014-03-15 LAB — BASIC METABOLIC PANEL
ANION GAP: 12 (ref 5–15)
BUN: 89 mg/dL — ABNORMAL HIGH (ref 6–23)
CALCIUM: 8.5 mg/dL (ref 8.4–10.5)
CHLORIDE: 126 meq/L — AB (ref 96–112)
CO2: 22 meq/L (ref 19–32)
Creatinine, Ser: 2.34 mg/dL — ABNORMAL HIGH (ref 0.50–1.10)
GFR calc non Af Amer: 20 mL/min — ABNORMAL LOW (ref >90)
GFR, EST AFRICAN AMERICAN: 23 mL/min — AB (ref >90)
Glucose, Bld: 110 mg/dL — ABNORMAL HIGH (ref 70–99)
Potassium: 4.1 mEq/L (ref 3.7–5.3)
Sodium: 160 mEq/L — ABNORMAL HIGH (ref 137–147)

## 2014-03-15 LAB — POCT I-STAT 3, ART BLOOD GAS (G3+)
Acid-base deficit: 5 mmol/L — ABNORMAL HIGH (ref 0.0–2.0)
Bicarbonate: 21 mEq/L (ref 20.0–24.0)
O2 Saturation: 72 %
PH ART: 7.318 — AB (ref 7.350–7.450)
Patient temperature: 98.6
TCO2: 22 mmol/L (ref 0–100)
pCO2 arterial: 40.9 mmHg (ref 35.0–45.0)
pO2, Arterial: 41 mmHg — ABNORMAL LOW (ref 80.0–100.0)

## 2014-03-15 LAB — MAGNESIUM: MAGNESIUM: 2.6 mg/dL — AB (ref 1.5–2.5)

## 2014-03-15 MED ORDER — DEXTROSE 5 % IV SOLN
INTRAVENOUS | Status: DC
  Administered 2014-03-15: 12:00:00 via INTRAVENOUS

## 2014-03-15 MED ORDER — DOPAMINE-DEXTROSE 3.2-5 MG/ML-% IV SOLN
2.0000 ug/kg/min | INTRAVENOUS | Status: DC
  Administered 2014-03-15: 5 ug/kg/min via INTRAVENOUS
  Filled 2014-03-15: qty 250

## 2014-03-15 MED ORDER — FUROSEMIDE 10 MG/ML IJ SOLN
INTRAMUSCULAR | Status: AC
  Administered 2014-03-15: 60 mg via INTRAVENOUS
  Filled 2014-03-15: qty 8

## 2014-03-15 MED ORDER — BISOPROLOL FUMARATE 5 MG PO TABS
2.5000 mg | ORAL_TABLET | Freq: Every day | ORAL | Status: DC
  Administered 2014-03-15: 2.5 mg via ORAL
  Filled 2014-03-15 (×2): qty 0.5

## 2014-03-15 MED ORDER — FUROSEMIDE 10 MG/ML IJ SOLN
60.0000 mg | Freq: Once | INTRAMUSCULAR | Status: AC
  Administered 2014-03-15: 60 mg via INTRAVENOUS

## 2014-03-15 MED ORDER — MORPHINE SULFATE 2 MG/ML IJ SOLN
2.0000 mg | Freq: Once | INTRAMUSCULAR | Status: AC
  Administered 2014-03-15: 2 mg via INTRAVENOUS
  Filled 2014-03-15: qty 1

## 2014-03-15 NOTE — Progress Notes (Signed)
o2 changed to 5L while eating breakfast but desats to low 80's, placed back on 100% vm with pulse ox 92%

## 2014-03-15 NOTE — Progress Notes (Signed)
SUBJECTIVE:    Lasix held yesterday due to worsening renal function. Cr slightly better today. 2.41->2.34   Lying in bed. very weak. Awake but falls asleep quickly.   Marland Kitchen. antiseptic oral rinse  7 mL Mouth Rinse QID  . arformoterol  15 mcg Nebulization Q12H  . aspirin  81 mg Oral Daily  . atorvastatin  40 mg Oral q1800  . budesonide (PULMICORT) nebulizer solution  0.5 mg Nebulization BID  . ceFEPime (MAXIPIME) IV  1 g Intravenous Q24H  . chlorhexidine  15 mL Mouth Rinse BID  . diltiazem  120 mg Oral Daily  . heparin subcutaneous  5,000 Units Subcutaneous 3 times per day  . hydrALAZINE  10 mg Oral TID  . ipratropium-albuterol  3 mL Nebulization QID  . metoprolol tartrate  12.5 mg Oral BID  . nystatin  5 mL Oral QID  . vancomycin  750 mg Intravenous Q48H   OBJECTIVE:   Vitals:   Filed Vitals:   03/14/14 2341 03/15/14 0300 03/15/14 0741 03/15/14 0826  BP: 98/43 96/66 106/52   Pulse: 88 87 88   Temp: 98.1 F (36.7 C) 98.1 F (36.7 C) 96.9 F (36.1 C)   TempSrc: Oral Oral Oral   Resp: 22 30 32   Height:      Weight:  63.8 kg (140 lb 10.5 oz)    SpO2: 99% 93% 89% 98%   I&O's:    Intake/Output Summary (Last 24 hours) at 03/15/14 1011 Last data filed at 03/14/14 2241  Gross per 24 hour  Intake    790 ml  Output    250 ml  Net    540 ml   TELEMETRY: Reviewed telemetry pt in NSR: PVCs  PHYSICAL EXAM General: Elderly NAD chronically ill appearing Head: Eyes PERRLA, No xanthomas.   Normal cephalic and atramatic  Lungs:   Scattered rhonchi diminished BS Heart:   HRRR S1 S2 Pulses are 2+ & equal. Abdomen: Bowel sounds are positive, abdomen soft and non-tender without masses  Extremities:   No clubbing, cyanosis or edema.   Neuro: Awake and answers questions but falls asleep quickly   LABS: Basic Metabolic Panel:  Recent Labs  16/04/9607/20/15 0500 03/14/14 0430 03/15/14 0400  NA 156* 157* 160*  K 4.1 3.8 4.1  CL 115* 119* 126*  CO2 25 23 22   GLUCOSE 125* 120* 110*    BUN 84* 91* 89*  CREATININE 2.28* 2.41* 2.34*  CALCIUM 9.0 8.7 8.5  MG 2.6*  --   --   PHOS 4.5  --   --     Recent Labs  03/14/14 0430 03/15/14 0400  WBC 19.9* 15.9*  HGB 9.2* 8.5*  HCT 29.0* 26.9*  MCV 97.3 99.3  PLT 322 283   Coag Panel:   Lab Results  Component Value Date   INR 1.11 03/19/2014   INR 1.27 03/20/2014   INR 0.96 10/04/2013   RADIOLOGY: Ct Head Wo Contrast  03/02/2014   CLINICAL DATA:  COPD. Cardiac arrest at home on 02/27/2014. Maximum down time 10-18 min.  Marland Kitchen.  IMPRESSION: 1. Atrophy and small vessel disease. 2. 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 3.  No evidence for acute intracranial abnormality.     Ct Head Wo Contrast  03/13/2014   CLINICAL DATA:  Rule out bleed, post resuscitation.    IMPRESSION: No evidence of intracranial hemorrhage.  Involutional changes. Severe white matter changes suggest chronic small vessel ischemic disease with bilateral thalamus and basal  ganglia lacunar infarcts.     Dg Chest Port 1 View  03/07/2014   CLINICAL DATA:  Acute hypoxia.  EXAM: PORTABLE CHEST - 1 VIEW  COMPARISON:  IMPRESSION: 1. Persistent left basilar airspace opacity, with mildly worsening bilateral mid lung zone airspace opacities, concerning for worsening multifocal pneumonia. Suspect small left pleural effusion. 2. Mild cardiomegaly.       ASSESSMENT AND PLAN:   73 yo with history of COPD on home oxygen admitted with PEA arrest s/p cooling, extubated with prolonged neurologic recovery, now AAO x 3  1. Cardiac arrest: PEA. Suspect primary respiratory arrest. Now rewarmed and off sedation. Head CT with nothing acute. She is AAO x 3.  2. Pulmonary: Severe COPD, suspect respiratory infection vs aspiration pneumonia vs ARDS with increasing oxygen demands.  On vanco/cefepime. WBC elevated, sed rate 112. Amio stopped for stopped considering possible acute amio toxicity.   3. Elevated troponin: Mild TnI elevation to 1.4,  steady. Suspect this is most likely demand ischemia related to PEA arrest/hypotension. Has baseline LBBB. Echo showed EF 35-40% with wall motion abnormalities, this is worse than prior (EF 50%). Could be stunning related to arrest.  - Continue ASA, statin - Will repeat limited echo to re-evaluate EF. - Would not pursue cath due to worsening renal function and comorbidities.   4. Acute systolic CHF -  Negative 7 liters since the admission, now dry and worsening Crea, continue to hold lasix.  - will stop diltiazem in setting of LV dysfunction. Seems to be tolerating b-blocker. Given COPD will switch to bisoprolol 2.5 - No ACE/ARB due to renal failure and low BP   5. A/C renal failure, worsening Crea, hold lasix   6. Severe hypernatremia: Increase free H20 per primary team. May have component of SIADH in setting of lung infection/COPD   7. Dispo: Overall she is very weak and ill. I feel Palliative care is the best option.   Melanie Meres, MD  03/15/2014  10:11 AM

## 2014-03-15 NOTE — Progress Notes (Signed)
  Echocardiogram 2D Echocardiogram limited has been performed.  Melanie Williamson 03/15/2014, 2:26 PM

## 2014-03-15 NOTE — Progress Notes (Signed)
ANTIBIOTIC CONSULT NOTE - FOLLOW UP  Pharmacy Consult for Vancomycin and Cefepime Indication: HCAP  No Known Allergies  Patient Measurements: Height: 5\' 3"  (160 cm) Weight: 140 lb 10.5 oz (63.8 kg) IBW/kg (Calculated) : 52.4  Vital Signs: Temp: 96.9 F (36.1 C) (08/22 0741) Temp src: Oral (08/22 0741) BP: 106/52 mmHg (08/22 0741) Pulse Rate: 88 (08/22 0741) Intake/Output from previous day: 08/21 0701 - 08/22 0700 In: 1090 [P.O.:580; I.V.:460; IV Piggyback:50] Out: 250 [Urine:250] Intake/Output from this shift:    Labs:  Recent Labs  03/13/14 0500 03/14/14 0430 03/15/14 0400  WBC 21.0* 19.9* 15.9*  HGB 9.6* 9.2* 8.5*  PLT 326 322 283  CREATININE 2.28* 2.41* 2.34*   Estimated Creatinine Clearance: 19.3 ml/min (by C-G formula based on Cr of 2.34).  Assessment: 73yof continues on vancomycin and cefepime for HCAP. Vancomycin adjusted to q48 dosing on 8/20 due to worsening renal function. SCr remains elevated but starting to trend down again.  Levaquin 8/7>>8/10 Vancomycin 8/7>>8/11>>resume 8/14>>8/16>resume 8/17>> Zosyn 8/7>>8/16 Cefepime 8/17>>  8/17 blood x2 - ngtd 8/17 urine - yeast 8/13 c diff - neg 8/7 urine - neg 8/7 blood x2 - neg   Goal of Therapy:  Vancomycin trough level 15-20 mcg/ml  Plan:  1) Continue vancomycin 750mg  IV q48 2) Continue cefepime 1g IV q24   Fredrik Rigger 03/15/2014,8:46 AM

## 2014-03-15 NOTE — Progress Notes (Addendum)
Melanie Williamson [id number] DOB: 06/23/1941 DOA: 03/15/2014 PCP: Warrick ParisianSTALLINGS,SHEILA, MD   Subj: 73 yo WF PMHx with chronic respiratory failure with hypoxia, COPD (on nocturnal oxygen), acute systolic CHF, paroxysmal atrial fibrillation, chronic kidney disease stage III, admitted 8/6 after suffering a witnessed cardiac arrest at home. She was intubated in field and ROSC was achieved with 10 mins of CPR and epi x 3. Maximum total downtime 10-18 minutes based on EMS. Required intubation and hypothermic protocol.  After admit found with elevated TNI, new systolic HF with shock and AF requiring Amiodarone. It was also suspected she may have aspirated at time of arrest. After extubation she was confused but stable from a neurological standpoint but then began to slur her words and not using her right hand. Neurology was consulted and an MRI is pending. EEG done earlier while on the vent with sharp waves seen over the posterior quadrant regions indicate possible epileptogenic potential from the occipital and right posterior temporal regions. There were no electrographic seizures seen. She has subsequently developed more defined left basilar infiltrate with leukocytosis and is on broad spectrum anbx's  [facility] but due to progressive respiratory failure with associated LLL collapse she was sent back to the ICU on 8/15. Unclear if also ARDS or pulmonary edema. 8/22 paged to bedside secondary to patient having increased respiratory demand. Patient alert able to communicate by nodding her head yes and no but unable to speak secondary to SOB. Positive SOB, negative CP.,   Obj: Objective: VITAL SIGNS: Temp: 98.4 F (36.9 C) (08/22 1713) Temp src: Oral (08/22 1713) BP: 83/58 mmHg (08/22 1713) Pulse Rate: 89 (08/22 1713) SPO2; 92% on 15 L via nonrebreather FIO2: 100%   Intake/Output Summary (Last 24 hours) at 03/15/14 2048 Last data filed at 03/15/14 1900  Gross per 24 hour  Intake   1130 ml   Output    250 ml  Net    880 ml     Exam: General: Alert, cooperates with exam, able to nod yes/no to answers but too short of breath to verbally communicate. Positive acute respiratory distress Lungs: Diffuse bilateral rales, positive mild expiratory wheeze apices NOTE Upon NTS thick yellow sticky mucus was suctioned out of the left bronchus, and what appeared to be a piece of food was suctioned out of the right bronchus. Respiratory tech states unable to advance the suction catheter more than approximately 3 inches into the right bronchus, however was able to advance the catheter easily into the left bronchus. Cardiovascular: Tachycardic, Regular rhythm without murmur gallop or rub normal S1 and S2 Abdomen: Nontender, nondistended, soft, bowel sounds positive, no rebound, no ascites, no appreciable mass Extremities: No significant cyanosis, clubbing, or edema bilateral lower extremities   Procedure/Significant Events: 8/06 admit to [facility]  8/8 rewarming started at 6:30 am, off pressors  8/11 extubated, briefly required BIPAP for pulm edema  8/14 Transfered to SDU  8/15 PCCM reconsulted for worsening resp failure with hypoxemia. CXR showed LLL collapse with cutoff sign  8/15 comfortable on NRB. Chest percussion and nebulized BDs ordered  8/16 oxygenation improved some. LLL collapse improved. Agitated delirium. Systemic steroids and PRN lorazepam discontinued  8/17 on 100% NRB, made LCB with no CPR/cardioversion/ETT. Cefepime/vanc restarted.  8/18 Venti mask trial on 14L at 55% resulted in 87-77% O2 in 3 minutes  8/22 echocardiogram Mid and basal inferior wall hypokinesis ,mild LVH. LVEF= 45- 50%. - Left atrium: moderately dilated.       Culture  Antibiotics: Cefepime 8/17>>     A/P Acute respiratory distress with hypoxia -Patient is a DO NOT INTUBATE, however have placed patient on nonrebreather with SpO2 hovering between 80%-92% -Patient is wet, Lasix 60 mg IV  x1 -Patient also appears to have bronchial secretions which could not be cleared Will NTS -Obtain ABG, CMP, magnesium -KVO D5W - Morphine IV 2 mg x1 for vasodilatory affect Addendum; page back to bedside at approximately 0000 secondary to patient's worsening respiratory status. Respiratory again NTS patient and removed approximately 100 mL of thick yellow/pink sputum. Patient's respiratory status continued to decline. Again spoke with [facility] again affirmed that his grandmother does not want to be intubated. States he understands that the only way to improve her oxygen level would be to intubate her and then perform a therapeutic thoracoscopy to remove all of the heavy mucus secretions, and again declines. States he just wants his grandmother to be comfortable. -CODE STATUS changed to comfort care -Scopolamine patch -Schedule morphine 1 mg/hr PRN  for comfort  Hypernatremia -Appears even with free water i.e. D5W at 81ml/hr patient is having worsening hypernatremia. Poor prognostic factor. -KVO D5W for now.  Systolic CHF -8/22 echocardiogram showedMid and basal inferior wall hypokinesis ,mild LVH. LVEF= 45- 50%. - Left atrium: moderately dilated. -Start dopamine drip titrate to maintain MAP> 65  Aspiration PNA/ Post Obstructive PNA -Make patient n.p.o. secondary to what appears to have been a piece of food suctioned out of the right bronchus -Speech to perform swallowing after patient's respiratory status stabilized in 48-72 hrs  Goals of care -Eyvonne Mechanic [phone] is the HCPOA -Confirms that patient does not want  intubation, CPR, or defibrillation -ADDENDUM; patient made comfort care by Eyvonne Mechanic secondary to patient's respiratory status continued to decline with maximum treatment within the patient's wishes

## 2014-03-15 NOTE — Progress Notes (Signed)
[facility]  JENNIFE ZAUCHA ZOX:[id number] DOB: [birth year] DOA: 03/07/2014 PCP: Warrick Parisian, MD  Brief narrative: 73 yo F w/ Hx with chronic respiratory failure with hypoxia, COPD (on nocturnal oxygen), systolic CHF, paroxysmal atrial fibrillation, and chronic kidney disease stage III, who was admitted 8/6 after suffering a witnessed cardiac arrest at home. She was intubated in field and ROSC was achieved with 10 mins of CPR and epi x 3. Maximum total downtime 10-18 minutes based on EMS. Required hypothermic protocol.  After admit found with elevated TNI, new systolic HF with shock and AF requiring Amiodarone. It was also suspected she may have aspirated at time of arrest. After extubation she was confused but stable from a neurological standpoint but then began to slur her words and not using her right hand. Neurology was consulted. EEG noted sharp waves over the posterior quadrant regions indicate possible epileptogenic potential from the occipital and right posterior temporal regions. There were no electrographic seizures seen. She also developed a defined left basilar infiltrate on CXR with leukocytosis and was placed on broad spectrum abxs.  Care assumed by [facility] 8/14 but due to progressive respiratory failure with associated LLL collapse she was sent back to the ICU on 8/15. Unclear if decline was due to ARDS or pulmonary edema. She required BiPAP and due to previously expressed desire for no ETT mechanical ventilation was not considered.  SIGNIFICANT EVENTS:  8/06 admit to [facility] 8/8 rewarming started at 6:30 am, off pressors  8/11 extubated, briefly required BIPAP for pulm edema  8/14 Transfered to SDU  8/15 PCCM reconsulted for worsening resp failure with hypoxemia. CXR showed LLL collapse with cutoff sign  8/15 comfortable on NRB. Chest percussion and nebulized BDs ordered  8/16 oxygenation improved some. LLL collapse improved. Agitated delirium.  Systemic steroids and PRN lorazepam discontinued  8/17 on 100% NRB, made LCB with no CPR/cardioversion/ETT. Cefepime/vanc restarted.  8/18 Venti mask trial on 14L at 55% resulted in 87-77% O2 in 3 minutes  HPI/Subjective: Pt is alert and conversant.  She denies cp, sob, n/v, or abdom pain.    Assessment/Plan:  PEA Cardiac arrest / demand ischemia  TNI peaked at 2.97 and cards felt was due to demand ischemia in setting of primary respiratory arrest - no plans for cath at this time due to CKD with ARF  Acute respiratory failure with hypoxia   A) Acute systolic congestive heart failure, NYHA class 2 (EF 35-40%)   B) HCAP with LLL collapse   C) Acute lung injury   D) Severe COPD - Home O2 dependent Heart failure management per Cardiology - has been aggressively diuresed since admission - continues to require high dose oxygen - white count has not normalized but remaining afebrile on Cefepime and Vanc - cultures unrevealing - has refused vibra vest   Hypernatremia Na has risen post diuresis with intermittent hypotension - BP improved w/ volume challenges - change IVF to increase free water and follow     Acute renal failure on CKD, stage III Baseline crt 1.46 - crt slightly improved w/ volume - follow w/ ongoing careful hydration   Altered mental state / Dysarthria Her mentation has improved considerably - head CT non acute - EEG demonstrated nonspecific changes - SLP recommended a D1 diet.  Oral thrush Nystatin S/S initiated on 8/20  Profound deconditioning Palliaitive working with pt and family and pt confirmed desires no ETT/mech ventilation, CPR or defib - PT/OT recommend SNF  HTN  BP has been soft to low over the past several days prompting dose reductions in metoprolol and hydralazine and discontinuation of Nitropatch  Atrial tachycardia / PAF Currently maintaining NSR - Amiodarone was dc'd due to concerns of toxicity  Anemia Hgb fluctuating w/ hydration - no evidence of  acute blood loss - follow trend   Chronic LBBB  DVT prophylaxis: Subcutaneous heparin Code Status: NO CPR, INTUBATION, DEFIB Family Communication: No family at bedside Disposition Plan/Expected LOS: Stepdown  Consultants: Cardiology PCCM Neurology-signed off  Procedures: 8/7 Echo >> EF 35 to 40%, mod MR  8/7 Doppler legs b/l >> no DVT 8/10 EEG>>diffuse cerebral dysfunction that is non-specific in etiology and can be seen with hypoxic/ischemic injury, toxic/metabolic encephalopathies, or medication effect. Sharp waves seen over the posterior quadrant regions indicate possible epileptogenic potential from the occipital and right posterior temporal regions. There were no electrographic seizures seen in this study  8/11 renal ultrasound > no hydro    Antibiotics: Levaquin 8/7-8/9  Zosyn 8/7 >> 8/16  Vanc 8/7 >> 8/22 Cefepime 8/17>>  Objective: Blood pressure 97/48, pulse 84, temperature 98.7 F (37.1 C), temperature source Axillary, resp. rate 21, height 5\' 3"  (1.6 m), weight 63.8 kg (140 lb 10.5 oz), SpO2 98.00%.  Intake/Output Summary (Last 24 hours) at 03/15/14 1206 Last data filed at 03/15/14 1610  Gross per 24 hour  Intake    940 ml  Output    250 ml  Net    690 ml   Exam: Gen: No acute respiratory distress Neuro: Awake and alert, mildly confused Chest: Coarse to auscultation bilaterally with exp wheezes anteriorly, 100% NRB Cardiac: Regular rate and rhythm, S1-S2, no rubs murmurs or gallops, no peripheral edema, no JVD Abdomen: Soft nontender nondistended, no ascites Extremities: without cyanosis, clubbing or signif edema   Scheduled Meds:  Scheduled Meds: . antiseptic oral rinse  7 mL Mouth Rinse QID  . arformoterol  15 mcg Nebulization Q12H  . aspirin  81 mg Oral Daily  . atorvastatin  40 mg Oral q1800  . bisoprolol  2.5 mg Oral Daily  . budesonide (PULMICORT) nebulizer solution  0.5 mg Nebulization BID  . ceFEPime (MAXIPIME) IV  1 g Intravenous Q24H  .  chlorhexidine  15 mL Mouth Rinse BID  . heparin subcutaneous  5,000 Units Subcutaneous 3 times per day  . hydrALAZINE  10 mg Oral TID  . ipratropium-albuterol  3 mL Nebulization QID  . nystatin  5 mL Oral QID  . vancomycin  750 mg Intravenous Q48H   Data Reviewed: Basic Metabolic Panel:  Recent Labs Lab 03/09/14 0500 03/10/14 0346 03/11/14 0418 03/12/14 0500 03/13/14 0500 03/14/14 0430 03/15/14 0400  NA 151* 145 145 150* 156* 157* 160*  K 3.2* 3.8 4.3 5.0 4.1 3.8 4.1  CL 108 108 110 111 115* 119* 126*  CO2 25 23 22 24 25 23 22   GLUCOSE 156* 139* 128* 124* 125* 120* 110*  BUN 69* 73* 64* 65* 84* 91* 89*  CREATININE 2.68* 2.24* 1.89* 1.87* 2.28* 2.41* 2.34*  CALCIUM 9.0 8.8 8.5 9.2 9.0 8.7 8.5  MG 2.3  --  2.3 2.4 2.6*  --   --   PHOS  --   --  5.2* 4.2 4.5  --   --    Liver Function Tests:  Recent Labs Lab 03/09/14 0500 03/10/14 0346  AST 22 25  ALT 21 22  ALKPHOS 68 66  BILITOT 0.4 0.3  PROT 6.2 6.0  ALBUMIN 2.5* 2.5*  CBC:  Recent Labs Lab 03/08/14 1257 03/09/14 0500  03/11/14 0418 03/12/14 0500 03/13/14 0500 03/14/14 0430 03/15/14 0400  WBC 16.5* 13.6*  < > 17.6* 23.0* 21.0* 19.9* 15.9*  NEUTROABS 13.2* 12.4*  --   --   --   --   --   --   HGB 10.4* 9.5*  < > 9.1* 9.9* 9.6* 9.2* 8.5*  HCT 31.7* 29.1*  < > 28.3* 30.4* 29.1* 29.0* 26.9*  MCV 91.9 93.3  < > 95.0 94.1 94.8 97.3 99.3  PLT 269 251  < > 293 340 326 322 283  < > = values in this interval not displayed.  Cardiac Enzymes:  Recent Labs Lab 03/08/14 1232  TROPONINI 1.10*   BNP (last 3 results)  Recent Labs  03/05/14 1058 03/08/14 0032  PROBNP 38198.0* 50443.0*   CBG:  Recent Labs Lab 03/11/14 0737 03/11/14 1234 03/14/14 1647 03/14/14 2158  GLUCAP 109* 127* 127* 98    Recent Results (from the past 240 hour(s))  CLOSTRIDIUM DIFFICILE BY PCR     Status: None   Collection Time    03/06/14  8:59 AM      Result Value Ref Range Status   C difficile by pcr NEGATIVE  NEGATIVE  Final  CULTURE, BLOOD (ROUTINE X 2)     Status: None   Collection Time    03/10/14 11:40 AM      Result Value Ref Range Status   Specimen Description BLOOD LEFT HAND   Final   Special Requests BOTTLES DRAWN AEROBIC ONLY 1CC   Final   Culture  Setup Time     Final   Value: 03/10/2014 17:07     [facility]formed at [facility]   Culture     Final   Value:        BLOOD CULTURE RECEIVED NO GROWTH TO DATE CULTURE WILL BE HELD FOR 5 DAYS BEFORE ISSUING A FINAL NEGATIVE REPORT     Performed at [facility]   Report Status PENDING   Incomplete  CULTURE, BLOOD (ROUTINE X 2)     Status: None   Collection Time    03/10/14 11:45 AM      Result Value Ref Range Status   Specimen Description BLOOD RIGHT HAND   Final   Special Requests BOTTLES DRAWN AEROBIC ONLY 1CC   Final   Culture  Setup Time     Final   Value: 03/10/2014 17:06     Performed at [facility]   Culture     Final   Value:        BLOOD CULTURE RECEIVED NO GROWTH TO DATE CULTURE WILL BE HELD FOR 5 DAYS BEFORE ISSUING A FINAL NEGATIVE REPORT     Performed at [facility]   Report Status PENDING   Incomplete  URINE CULTURE     Status: None   Collection Time    03/10/14  3:54 PM      Result Value Ref Range Status   Specimen Description URINE, RANDOM   Final   Special Requests NONE   Final   Culture  Setup Time     Final   Value: 03/10/2014 16:45     Performed at [facility]   Colony Count     Final   Value: 50,000 COLONIES/ML     Performed at [facility]   Culture     Final   Value: YEAST     Performed at [facility]  Report Status 03/11/2014 FINAL   Final     Studies:  Recent x-ray studies have been reviewed in detail by the Attending Physician  Time spent :  35 mins   Lonia Blood, MD [facility] For Consults/Admissions - Flow Manager - [phone] Office  [phone] Pager [phone]  On-Call/Text Page:      [email or web address]      password  [email or web address]  If 7PM-7AM, please contact night-coverage [email or web address] Password [email or web address] 03/15/2014, 12:06 PM   LOS: 15 days

## 2014-03-15 NOTE — Progress Notes (Signed)
Pt NTs with moderate amt of pink tinged tan secretions removed mostly from left side.  Rtside was occuled once you placed catherter about 2inches.  Rt pulled moderate size blood clot from right side.  Rt will continue to monitor.

## 2014-03-16 ENCOUNTER — Inpatient Hospital Stay (HOSPITAL_COMMUNITY): Payer: Medicare Other

## 2014-03-16 DIAGNOSIS — I5023 Acute on chronic systolic (congestive) heart failure: Secondary | ICD-10-CM

## 2014-03-16 DIAGNOSIS — J690 Pneumonitis due to inhalation of food and vomit: Principal | ICD-10-CM

## 2014-03-16 DIAGNOSIS — E870 Hyperosmolality and hypernatremia: Secondary | ICD-10-CM

## 2014-03-16 LAB — CULTURE, BLOOD (ROUTINE X 2)
Culture: NO GROWTH
Culture: NO GROWTH

## 2014-03-16 MED ORDER — SCOPOLAMINE 1 MG/3DAYS TD PT72
1.0000 | MEDICATED_PATCH | Freq: Once | TRANSDERMAL | Status: DC
  Filled 2014-03-16: qty 1

## 2014-03-16 MED ORDER — MORPHINE SULFATE 2 MG/ML IJ SOLN
1.0000 mg | INTRAMUSCULAR | Status: DC | PRN
  Administered 2014-03-16: 1 mg via INTRAVENOUS
  Filled 2014-03-16: qty 1

## 2014-03-16 MED ORDER — SCOPOLAMINE 1 MG/3DAYS TD PT72: 1.0000 | MEDICATED_PATCH | TRANSDERMAL | Status: DC

## 2014-03-25 NOTE — Discharge Summary (Addendum)
Death Summary  Melanie Williamson EXB:[id number] DOB: [birth year] DOA: 03-10-14  PCP: Warrick Parisian, MD  Admit date: Mar 10, 2014 Date of Death: 03/26/14  Final Diagnoses:  PEA Cardiac arrest / demand ischemia  Acute respiratory failure with hypoxia  Acute systolic congestive heart failure, NYHA class 2 (EF 35-40%)  HCAP with LLL collapse - Postobstructive/Aspiration PNA of L lung Acute lung injury  Severe COPD - Home O2 dependent  Hypernatremia  Acute renal failure on CKD, stage III  Altered mental state / Dysarthria   Oral thrush  Profound deconditioning  HTN  Atrial tachycardia / PAF  Chronic LBBB Severe malnutrition in the context of acute illness or injury   History of present illness:  73 yo F w/ Hx with chronic respiratory failure with hypoxia, COPD (on nocturnal oxygen), systolic CHF, paroxysmal atrial fibrillation, and chronic kidney disease stage III, who was admitted 8/6 after suffering a witnessed cardiac arrest at home. She was intubated in the field and ROSC was achieved with 10 mins of CPR w/ epi x 3. Maximum total downtime 10-18 minutes based on EMS.   Hospital Course:  8/06 admit to [facility]  8/8 rewarming started at 6:30 am, off pressors  8/11 extubated, briefly required BIPAP for pulm edema  8/14 Transfered to SDU  8/15 PCCM reconsulted for worsening resp failure with hypoxemia. CXR showed LLL collapse with cutoff sign  8/15 comfortable on NRB. Chest percussion and nebulized BDs ordered  8/16 oxygenation improved some. LLL collapse improved. Agitated delirium. Systemic steroids and PRN lorazepam discontinued  8/17 on 100% NRB, made LCB with no CPR/cardioversion/ETT. Cefepime/vanc restarted.  8/18 Venti mask trial on 14L at 55% resulted in 87-77% O2 in 3 minutes  8/22 acute resp distress due to L lung collapse w/ heavy retained secretions - unable to relieve w/ NTS - decision made to transition to comfort focus   At the time of her admission the pt required hypothermic  protocol.  She was found to have an elevated TNI, new systolic HF with shock and AF requiring Amiodarone. It was also suspected she may have aspirated at time of arrest. After extubation she was confused but stable from a neurological standpoint but then began to slur her words and not use her right hand. Neurology was consulted. EEG noted sharp waves over the posterior quadrant regions indicate possible epileptogenic potential from the occipital and right posterior temporal regions. There were no electrographic seizures seen. She also developed a defined left basilar infiltrate on CXR with leukocytosis and was placed on broad spectrum abxs.   Care was assumed by [facility] 8/14 but due to progressive respiratory failure with associated LLL collapse she was sent back to the ICU on 8/15. Unclear if decline was due to ARDS or pulmonary edema. She required BiPAP and due to previously expressed desire for no ETT mechanical ventilation was not considered. After stabilization, she again returned to the care of [facility] in the SDU on 8/20.  Despite some improvement in her general condition, she continued to face multiple challenges, including recurring resp impairment requiring O2 support, hypernatremia, fluctuating mental status, and systolic CHF w/ difficult to balance volume status.  On the night of 8/22 > 27-Mar-2023 she suffered another episode of acute decline, precipitated by hypoxic respiratory failure.  Exam and CXR were most c/w L bronchial obstruction due to mucous impaction and aspirated secretions.  Attempts were made to suction her deeply, and while copious secretions were removed, the pt did not improve clinically.  The attending MD was  at bedside, and discussed the serious nature of the acute decline to the POA.  The POA supported the pt's previously stated desire to not be placed back on a ventilator or to be subjected to further CPR/Coding.  The focus of her care was therefore transitioned to comfort, and she died at  03:50 04-07-14 morning.    SignedJetty Duhamel T  [facility] 07-Apr-2014, 9:06 AM

## 2014-03-25 NOTE — Progress Notes (Signed)
Rt NTS pt removing copious amts of tan and pink timged secretions.  Pt tolerated well.  Rt will continue to monitor.

## 2014-03-25 NOTE — Progress Notes (Signed)
Pt is using acessary muscle to breath. 100% non rerbreather intact and sat 88%. JVD noted. Respirations 36. Dr Joseph Art notified of pt's deteriorating condition. Dr Joseph Art up to see py.

## 2014-03-25 NOTE — Progress Notes (Signed)
Chaplain Note: Patient actively dying. Provided grief care and emotional and spiritual support for patient's family at bedside. Follow up recommended. Rutherford Nail, Chaplain

## 2014-03-25 NOTE — Progress Notes (Signed)
Pt has agonal respirations. Dopamine at BP 57/21. Multiple family members at bedside. Dr Joseph Art notified of pt's deteriorating status. HR 58 Sinus brady.

## 2014-03-25 NOTE — Progress Notes (Signed)
Respiratory in and suctioned large amount of thick tan, bloody sputum. Pt has 100% non rebreather in place. Pt desated from 85% to 58%. Dr Joseph Art paged in in to see Pt's grandson who is POA.

## 2014-03-25 DEATH — deceased

## 2016-06-09 IMAGING — CR DG CHEST 1V PORT
1 series · 1 of 1 positions shown · non-contrast
Comparison: 03/11/2014 and earlier.

CLINICAL DATA: 73-year-old female with left lower lobe collapse and
worsening respiratory status. Initial encounter.

EXAM:
PORTABLE CHEST - 1 VIEW

[AP]
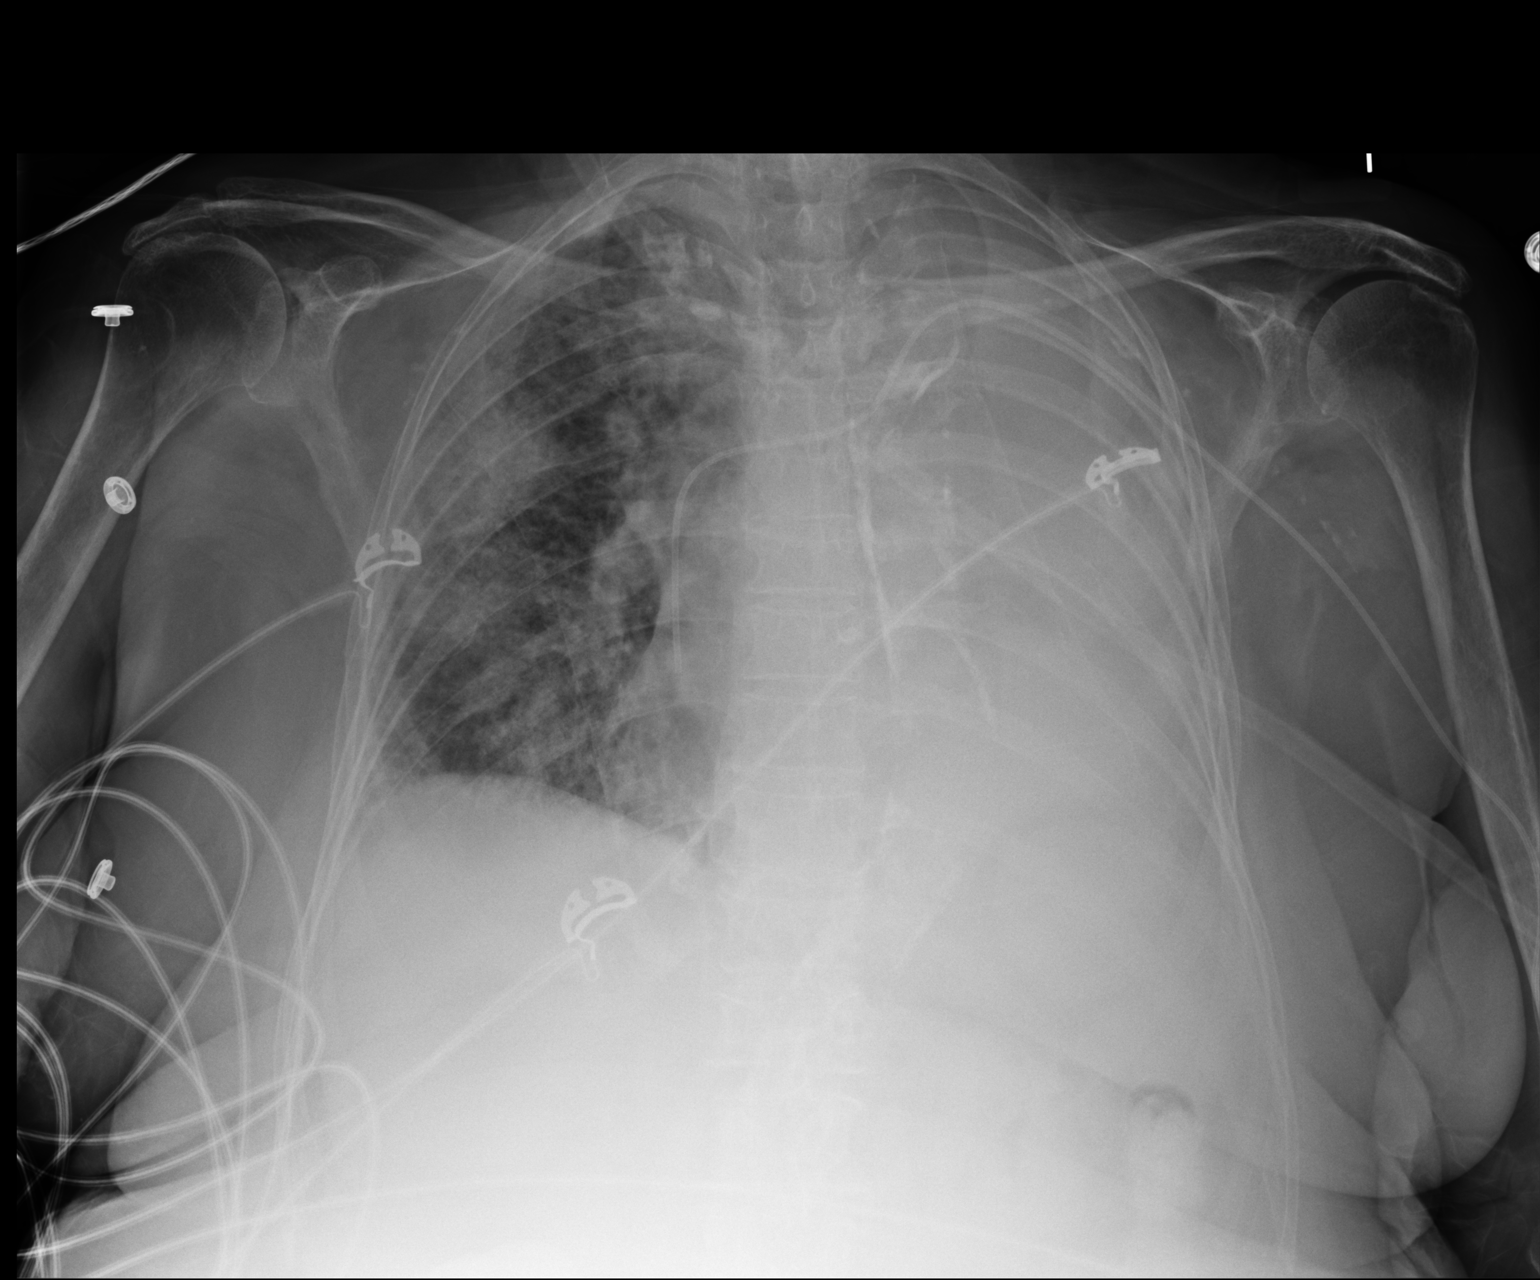

[1 of 1 positions shown; findings below may reference images not displayed]

FINDINGS: Portable AP semi upright view at 8099 hrs. There is now complete
opacification of the left hemi thorax. There is mild leftward shift
of mediastinal structures. Stable to mildly regressed patchy right
upper lung opacity. No pneumothorax. Extensive calcified
atherosclerosis of the aorta.
IMPRESSION: 1. Interval total opacification of the left hemi thorax, favor in
large part due to lung collapse. Probable small component of pleural
effusion.
2. Stable to mild regression of airspace disease in the right lung.
